# Patient Record
Sex: Male | Born: 1935 | Race: White | Hispanic: No | Marital: Married | Smoking: Former smoker
Health system: Southern US, Community
[De-identification: ages and names within clinical notes are randomized; demographics above are authoritative.]

## PROBLEM LIST (undated history)

## (undated) DIAGNOSIS — H6123 Impacted cerumen, bilateral: Secondary | ICD-10-CM

## (undated) DIAGNOSIS — H9191 Unspecified hearing loss, right ear: Secondary | ICD-10-CM

## (undated) DIAGNOSIS — R0602 Shortness of breath: Secondary | ICD-10-CM

## (undated) DIAGNOSIS — M8580 Other specified disorders of bone density and structure, unspecified site: Secondary | ICD-10-CM

## (undated) DIAGNOSIS — I2510 Atherosclerotic heart disease of native coronary artery without angina pectoris: Secondary | ICD-10-CM

## (undated) DIAGNOSIS — F411 Generalized anxiety disorder: Secondary | ICD-10-CM

## (undated) DIAGNOSIS — R2689 Other abnormalities of gait and mobility: Secondary | ICD-10-CM

## (undated) DIAGNOSIS — Z860100 Personal history of colon polyps, unspecified: Secondary | ICD-10-CM

## (undated) DIAGNOSIS — D696 Thrombocytopenia, unspecified: Secondary | ICD-10-CM

## (undated) DIAGNOSIS — R634 Abnormal weight loss: Secondary | ICD-10-CM

## (undated) DIAGNOSIS — D649 Anemia, unspecified: Secondary | ICD-10-CM

## (undated) DIAGNOSIS — M545 Low back pain: Principal | ICD-10-CM

## (undated) DIAGNOSIS — H9202 Otalgia, left ear: Secondary | ICD-10-CM

## (undated) DIAGNOSIS — M2669 Other specified disorders of temporomandibular joint: Secondary | ICD-10-CM

## (undated) DIAGNOSIS — IMO0001 Reserved for inherently not codable concepts without codable children: Secondary | ICD-10-CM

## (undated) DIAGNOSIS — Z87442 Personal history of urinary calculi: Secondary | ICD-10-CM

## (undated) DIAGNOSIS — Z0000 Encounter for general adult medical examination without abnormal findings: Secondary | ICD-10-CM

## (undated) DIAGNOSIS — M1611 Unilateral primary osteoarthritis, right hip: Secondary | ICD-10-CM

## (undated) DIAGNOSIS — G8929 Other chronic pain: Principal | ICD-10-CM

## (undated) DIAGNOSIS — K219 Gastro-esophageal reflux disease without esophagitis: Secondary | ICD-10-CM

## (undated) DIAGNOSIS — Z8719 Personal history of other diseases of the digestive system: Secondary | ICD-10-CM

## (undated) DIAGNOSIS — M7711 Lateral epicondylitis, right elbow: Secondary | ICD-10-CM

## (undated) DIAGNOSIS — R32 Unspecified urinary incontinence: Secondary | ICD-10-CM

## (undated) DIAGNOSIS — M79601 Pain in right arm: Secondary | ICD-10-CM

## (undated) DIAGNOSIS — R269 Unspecified abnormalities of gait and mobility: Secondary | ICD-10-CM

## (undated) DIAGNOSIS — G5602 Carpal tunnel syndrome, left upper limb: Secondary | ICD-10-CM

## (undated) DIAGNOSIS — G709 Myoneural disorder, unspecified: Secondary | ICD-10-CM

## (undated) DIAGNOSIS — M25551 Pain in right hip: Secondary | ICD-10-CM

## (undated) DIAGNOSIS — M12819 Other specific arthropathies, not elsewhere classified, unspecified shoulder: Secondary | ICD-10-CM

## (undated) DIAGNOSIS — H52203 Unspecified astigmatism, bilateral: Secondary | ICD-10-CM

## (undated) DIAGNOSIS — J8410 Pulmonary fibrosis, unspecified: Secondary | ICD-10-CM

## (undated) DIAGNOSIS — G4700 Insomnia, unspecified: Secondary | ICD-10-CM

## (undated) DIAGNOSIS — Z951 Presence of aortocoronary bypass graft: Secondary | ICD-10-CM

## (undated) DIAGNOSIS — J449 Chronic obstructive pulmonary disease, unspecified: Secondary | ICD-10-CM

## (undated) DIAGNOSIS — I480 Paroxysmal atrial fibrillation: Secondary | ICD-10-CM

## (undated) DIAGNOSIS — E785 Hyperlipidemia, unspecified: Secondary | ICD-10-CM

## (undated) DIAGNOSIS — R7302 Impaired glucose tolerance (oral): Secondary | ICD-10-CM

## (undated) DIAGNOSIS — J189 Pneumonia, unspecified organism: Secondary | ICD-10-CM

## (undated) DIAGNOSIS — I739 Peripheral vascular disease, unspecified: Secondary | ICD-10-CM

## (undated) DIAGNOSIS — M5441 Lumbago with sciatica, right side: Secondary | ICD-10-CM

## (undated) DIAGNOSIS — H9193 Unspecified hearing loss, bilateral: Secondary | ICD-10-CM

## (undated) DIAGNOSIS — K5909 Other constipation: Secondary | ICD-10-CM

## (undated) DIAGNOSIS — N200 Calculus of kidney: Secondary | ICD-10-CM

## (undated) DIAGNOSIS — R531 Weakness: Secondary | ICD-10-CM

## (undated) DIAGNOSIS — I6529 Occlusion and stenosis of unspecified carotid artery: Secondary | ICD-10-CM

## (undated) DIAGNOSIS — N400 Enlarged prostate without lower urinary tract symptoms: Secondary | ICD-10-CM

## (undated) DIAGNOSIS — R42 Dizziness and giddiness: Secondary | ICD-10-CM

## (undated) DIAGNOSIS — F419 Anxiety disorder, unspecified: Secondary | ICD-10-CM

## (undated) DIAGNOSIS — M1990 Unspecified osteoarthritis, unspecified site: Secondary | ICD-10-CM

## (undated) DIAGNOSIS — I951 Orthostatic hypotension: Secondary | ICD-10-CM

## (undated) DIAGNOSIS — E538 Deficiency of other specified B group vitamins: Secondary | ICD-10-CM

## (undated) DIAGNOSIS — R972 Elevated prostate specific antigen [PSA]: Secondary | ICD-10-CM

## (undated) DIAGNOSIS — F1010 Alcohol abuse, uncomplicated: Secondary | ICD-10-CM

## (undated) DIAGNOSIS — H5203 Hypermetropia, bilateral: Secondary | ICD-10-CM

## (undated) DIAGNOSIS — K5790 Diverticulosis of intestine, part unspecified, without perforation or abscess without bleeding: Secondary | ICD-10-CM

## (undated) DIAGNOSIS — H524 Presbyopia: Secondary | ICD-10-CM

## (undated) DIAGNOSIS — R079 Chest pain, unspecified: Secondary | ICD-10-CM

## (undated) DIAGNOSIS — R2681 Unsteadiness on feet: Secondary | ICD-10-CM

## (undated) DIAGNOSIS — K5900 Constipation, unspecified: Secondary | ICD-10-CM

## (undated) DIAGNOSIS — J45909 Unspecified asthma, uncomplicated: Secondary | ICD-10-CM

## (undated) DIAGNOSIS — M25511 Pain in right shoulder: Secondary | ICD-10-CM

## (undated) DIAGNOSIS — R251 Tremor, unspecified: Secondary | ICD-10-CM

## (undated) DIAGNOSIS — F1011 Alcohol abuse, in remission: Secondary | ICD-10-CM

## (undated) DIAGNOSIS — R21 Rash and other nonspecific skin eruption: Secondary | ICD-10-CM

## (undated) DIAGNOSIS — K5730 Diverticulosis of large intestine without perforation or abscess without bleeding: Secondary | ICD-10-CM

## (undated) DIAGNOSIS — Z5189 Encounter for other specified aftercare: Secondary | ICD-10-CM

## (undated) DIAGNOSIS — M519 Unspecified thoracic, thoracolumbar and lumbosacral intervertebral disc disorder: Secondary | ICD-10-CM

## (undated) DIAGNOSIS — R609 Edema, unspecified: Secondary | ICD-10-CM

## (undated) DIAGNOSIS — R4586 Emotional lability: Secondary | ICD-10-CM

## (undated) DIAGNOSIS — M7021 Olecranon bursitis, right elbow: Secondary | ICD-10-CM

## (undated) DIAGNOSIS — I779 Disorder of arteries and arterioles, unspecified: Secondary | ICD-10-CM

## (undated) HISTORY — DX: Constipation, unspecified: K59.00

## (undated) HISTORY — DX: Pulmonary fibrosis, unspecified: J84.10

## (undated) HISTORY — DX: Diverticulosis of intestine, part unspecified, without perforation or abscess without bleeding: K57.90

## (undated) HISTORY — DX: Rash and other nonspecific skin eruption: R21

## (undated) HISTORY — DX: Unspecified thoracic, thoracolumbar and lumbosacral intervertebral disc disorder: M51.9

## (undated) HISTORY — DX: Orthostatic hypotension: I95.1

## (undated) HISTORY — DX: Other specific arthropathies, not elsewhere classified, unspecified shoulder: M12.819

## (undated) HISTORY — DX: Gastro-esophageal reflux disease without esophagitis: K21.9

## (undated) HISTORY — DX: Occlusion and stenosis of unspecified carotid artery: I65.29

## (undated) HISTORY — DX: Otalgia, left ear: H92.02

## (undated) HISTORY — DX: Tremor, unspecified: R25.1

## (undated) HISTORY — DX: Edema, unspecified: R60.9

## (undated) HISTORY — DX: Encounter for general adult medical examination without abnormal findings: Z00.00

## (undated) HISTORY — DX: Personal history of colonic polyps: Z86.010

## (undated) HISTORY — DX: Pain in right hip: M25.551

## (undated) HISTORY — DX: Alcohol abuse, in remission: F10.11

## (undated) HISTORY — DX: Unilateral primary osteoarthritis, right hip: M16.11

## (undated) HISTORY — DX: Diverticulosis of large intestine without perforation or abscess without bleeding: K57.30

## (undated) HISTORY — DX: Personal history of other diseases of the digestive system: Z87.19

## (undated) HISTORY — DX: Emotional lability: R45.86

## (undated) HISTORY — DX: Alcohol abuse, uncomplicated: F10.10

## (undated) HISTORY — DX: Calculus of kidney: N20.0

## (undated) HISTORY — DX: Other chronic pain: G89.29

## (undated) HISTORY — DX: Impacted cerumen, bilateral: H61.23

## (undated) HISTORY — DX: Unspecified urinary incontinence: R32

## (undated) HISTORY — DX: Personal history of urinary calculi: Z87.442

## (undated) HISTORY — PX: KIDNEY STONE SURGERY: SHX686

## (undated) HISTORY — DX: Olecranon bursitis, right elbow: M70.21

## (undated) HISTORY — DX: Insomnia, unspecified: G47.00

## (undated) HISTORY — PX: TONSILLECTOMY: SUR1361

## (undated) HISTORY — DX: Presbyopia: H52.4

## (undated) HISTORY — DX: Other abnormalities of gait and mobility: R26.89

## (undated) HISTORY — DX: Unspecified abnormalities of gait and mobility: R26.9

## (undated) HISTORY — DX: Dizziness and giddiness: R42

## (undated) HISTORY — DX: Generalized anxiety disorder: F41.1

## (undated) HISTORY — PX: COLONOSCOPY: SHX174

## (undated) HISTORY — DX: Benign prostatic hyperplasia without lower urinary tract symptoms: N40.0

## (undated) HISTORY — DX: Presence of aortocoronary bypass graft: Z95.1

## (undated) HISTORY — DX: Pneumonia, unspecified organism: J18.9

## (undated) HISTORY — DX: Hyperlipidemia, unspecified: E78.5

## (undated) HISTORY — DX: Weakness: R53.1

## (undated) HISTORY — DX: Unspecified hearing loss, bilateral: H91.93

## (undated) HISTORY — DX: Hypermetropia, bilateral: H52.03

## (undated) HISTORY — PX: CARDIAC CATHETERIZATION: SHX172

## (undated) HISTORY — DX: Low back pain: M54.5

## (undated) HISTORY — DX: Peripheral vascular disease, unspecified: I73.9

## (undated) HISTORY — DX: Other constipation: K59.09

## (undated) HISTORY — DX: Atherosclerotic heart disease of native coronary artery without angina pectoris: I25.10

## (undated) HISTORY — DX: Unspecified astigmatism, bilateral: H52.203

## (undated) HISTORY — DX: Unspecified asthma, uncomplicated: J45.909

## (undated) HISTORY — DX: Deficiency of other specified B group vitamins: E53.8

## (undated) HISTORY — DX: Lateral epicondylitis, right elbow: M77.11

## (undated) HISTORY — DX: Lumbago with sciatica, right side: M54.41

## (undated) HISTORY — DX: Pain in right shoulder: M25.511

## (undated) HISTORY — PX: CATARACT EXTRACTION: SUR2

## (undated) HISTORY — DX: Carpal tunnel syndrome, left upper limb: G56.02

## (undated) HISTORY — DX: Elevated prostate specific antigen (PSA): R97.20

## (undated) HISTORY — DX: Abnormal weight loss: R63.4

## (undated) HISTORY — DX: Other specified disorders of bone density and structure, unspecified site: M85.80

## (undated) HISTORY — DX: Impaired glucose tolerance (oral): R73.02

## (undated) HISTORY — DX: Pain in right arm: M79.601

## (undated) HISTORY — DX: Paroxysmal atrial fibrillation: I48.0

## (undated) HISTORY — DX: Thrombocytopenia, unspecified: D69.6

## (undated) HISTORY — DX: Chest pain, unspecified: R07.9

## (undated) HISTORY — DX: Unsteadiness on feet: R26.81

## (undated) HISTORY — DX: Disorder of arteries and arterioles, unspecified: I77.9

## (undated) HISTORY — DX: Personal history of colon polyps, unspecified: Z86.0100

## (undated) HISTORY — DX: Unspecified hearing loss, right ear: H91.91

## (undated) HISTORY — DX: Other specified disorders of temporomandibular joint: M26.69

---

## 2004-10-25 ENCOUNTER — Ambulatory Visit: Payer: Self-pay | Admitting: Critical Care Medicine

## 2004-11-25 ENCOUNTER — Ambulatory Visit: Payer: Self-pay | Admitting: Critical Care Medicine

## 2004-12-29 ENCOUNTER — Ambulatory Visit: Payer: Self-pay | Admitting: Critical Care Medicine

## 2005-04-04 ENCOUNTER — Ambulatory Visit: Payer: Self-pay | Admitting: Critical Care Medicine

## 2005-04-19 ENCOUNTER — Ambulatory Visit: Payer: Self-pay | Admitting: Internal Medicine

## 2005-04-26 ENCOUNTER — Ambulatory Visit: Payer: Self-pay | Admitting: Cardiology

## 2005-05-02 ENCOUNTER — Ambulatory Visit: Payer: Self-pay | Admitting: Internal Medicine

## 2005-05-29 ENCOUNTER — Ambulatory Visit: Payer: Self-pay | Admitting: Internal Medicine

## 2005-09-08 ENCOUNTER — Ambulatory Visit: Payer: Self-pay

## 2005-09-08 ENCOUNTER — Ambulatory Visit: Payer: Self-pay | Admitting: Cardiology

## 2005-09-12 ENCOUNTER — Ambulatory Visit (HOSPITAL_COMMUNITY): Admission: RE | Admit: 2005-09-12 | Discharge: 2005-09-13 | Payer: Self-pay | Admitting: Ophthalmology

## 2005-09-13 ENCOUNTER — Ambulatory Visit: Payer: Self-pay

## 2006-02-06 ENCOUNTER — Ambulatory Visit: Payer: Self-pay

## 2006-07-13 ENCOUNTER — Ambulatory Visit: Payer: Self-pay | Admitting: Critical Care Medicine

## 2006-08-10 ENCOUNTER — Ambulatory Visit: Payer: Self-pay | Admitting: Critical Care Medicine

## 2006-11-26 ENCOUNTER — Ambulatory Visit: Payer: Self-pay | Admitting: Critical Care Medicine

## 2006-12-04 DIAGNOSIS — R091 Pleurisy: Secondary | ICD-10-CM | POA: Insufficient documentation

## 2006-12-04 DIAGNOSIS — K219 Gastro-esophageal reflux disease without esophagitis: Secondary | ICD-10-CM

## 2006-12-04 HISTORY — DX: Gastro-esophageal reflux disease without esophagitis: K21.9

## 2007-01-01 ENCOUNTER — Ambulatory Visit: Payer: Self-pay | Admitting: Internal Medicine

## 2007-01-01 LAB — CONVERTED CEMR LAB
ALT: 32 units/L (ref 0–53)
AST: 37 units/L (ref 0–37)
Albumin: 3.5 g/dL (ref 3.5–5.2)
BUN: 14 mg/dL (ref 6–23)
Bilirubin, Direct: 0.1 mg/dL (ref 0.0–0.3)
CO2: 27 meq/L (ref 19–32)
Calcium: 9.3 mg/dL (ref 8.4–10.5)
Chloride: 105 meq/L (ref 96–112)
Eosinophils Absolute: 0.4 10*3/uL (ref 0.0–0.6)
GFR calc non Af Amer: 117 mL/min
HCT: 41.2 % (ref 39.0–52.0)
Hemoglobin: 14.1 g/dL (ref 13.0–17.0)
Leukocytes, UA: NEGATIVE
MCHC: 34.3 g/dL (ref 30.0–36.0)
MCV: 94.4 fL (ref 78.0–100.0)
Monocytes Absolute: 0.7 10*3/uL (ref 0.2–0.7)
Neutro Abs: 5.7 10*3/uL (ref 1.4–7.7)
Nitrite: NEGATIVE
RDW: 12.9 % (ref 11.5–14.6)
Sodium: 139 meq/L (ref 135–145)
Specific Gravity, Urine: 1.025 (ref 1.000–1.03)
Total Bilirubin: 0.6 mg/dL (ref 0.3–1.2)
Urine Glucose: NEGATIVE mg/dL
Urobilinogen, UA: 0.2 (ref 0.0–1.0)
WBC: 9.8 10*3/uL (ref 4.5–10.5)

## 2007-03-05 ENCOUNTER — Ambulatory Visit: Payer: Self-pay | Admitting: Internal Medicine

## 2007-05-21 DIAGNOSIS — G8929 Other chronic pain: Secondary | ICD-10-CM

## 2007-05-21 DIAGNOSIS — E785 Hyperlipidemia, unspecified: Secondary | ICD-10-CM

## 2007-05-21 DIAGNOSIS — Z87442 Personal history of urinary calculi: Secondary | ICD-10-CM

## 2007-05-21 DIAGNOSIS — Z8719 Personal history of other diseases of the digestive system: Secondary | ICD-10-CM

## 2007-05-21 DIAGNOSIS — G4700 Insomnia, unspecified: Secondary | ICD-10-CM

## 2007-05-21 DIAGNOSIS — F1011 Alcohol abuse, in remission: Secondary | ICD-10-CM

## 2007-05-21 HISTORY — DX: Personal history of other diseases of the digestive system: Z87.19

## 2007-05-21 HISTORY — DX: Alcohol abuse, in remission: F10.11

## 2007-05-21 HISTORY — DX: Other chronic pain: G89.29

## 2007-05-21 HISTORY — DX: Personal history of urinary calculi: Z87.442

## 2007-06-27 ENCOUNTER — Ambulatory Visit: Payer: Self-pay | Admitting: Critical Care Medicine

## 2007-06-27 DIAGNOSIS — J479 Bronchiectasis, uncomplicated: Secondary | ICD-10-CM

## 2008-07-17 ENCOUNTER — Telehealth: Payer: Self-pay | Admitting: Internal Medicine

## 2008-07-21 ENCOUNTER — Ambulatory Visit: Payer: Self-pay | Admitting: Internal Medicine

## 2008-08-05 ENCOUNTER — Telehealth: Payer: Self-pay | Admitting: Internal Medicine

## 2009-05-03 ENCOUNTER — Ambulatory Visit: Payer: Self-pay | Admitting: Critical Care Medicine

## 2009-05-04 ENCOUNTER — Telehealth: Payer: Self-pay | Admitting: Critical Care Medicine

## 2009-05-21 ENCOUNTER — Ambulatory Visit: Payer: Self-pay | Admitting: Critical Care Medicine

## 2010-02-18 ENCOUNTER — Telehealth: Payer: Self-pay | Admitting: Internal Medicine

## 2010-02-21 ENCOUNTER — Ambulatory Visit: Payer: Self-pay | Admitting: Gastroenterology

## 2010-02-21 ENCOUNTER — Encounter: Payer: Self-pay | Admitting: Internal Medicine

## 2010-02-21 DIAGNOSIS — Z860100 Personal history of colon polyps, unspecified: Secondary | ICD-10-CM

## 2010-02-21 DIAGNOSIS — K5900 Constipation, unspecified: Secondary | ICD-10-CM

## 2010-02-21 DIAGNOSIS — K5730 Diverticulosis of large intestine without perforation or abscess without bleeding: Secondary | ICD-10-CM

## 2010-02-21 HISTORY — DX: Diverticulosis of large intestine without perforation or abscess without bleeding: K57.30

## 2010-02-21 HISTORY — DX: Personal history of colonic polyps: Z86.010

## 2010-02-21 HISTORY — DX: Personal history of colon polyps, unspecified: Z86.0100

## 2010-02-21 HISTORY — DX: Constipation, unspecified: K59.00

## 2010-02-23 LAB — CONVERTED CEMR LAB
Basophils Absolute: 0.2 10*3/uL — ABNORMAL HIGH (ref 0.0–0.1)
Basophils Relative: 1.5 % (ref 0.0–3.0)
Eosinophils Absolute: 0.3 10*3/uL (ref 0.0–0.7)
Lymphocytes Relative: 18.8 % (ref 12.0–46.0)
Lymphs Abs: 2 10*3/uL (ref 0.7–4.0)
Monocytes Absolute: 0.7 10*3/uL (ref 0.1–1.0)
Monocytes Relative: 6.5 % (ref 3.0–12.0)
Neutro Abs: 7.6 10*3/uL (ref 1.4–7.7)
Platelets: 158 10*3/uL (ref 150.0–400.0)
RDW: 14.3 % (ref 11.5–14.6)
WBC: 10.8 10*3/uL — ABNORMAL HIGH (ref 4.5–10.5)

## 2010-03-15 ENCOUNTER — Ambulatory Visit: Payer: Self-pay | Admitting: Internal Medicine

## 2010-03-17 ENCOUNTER — Encounter: Payer: Self-pay | Admitting: Internal Medicine

## 2010-03-25 ENCOUNTER — Ambulatory Visit: Admit: 2010-03-25 | Payer: Self-pay | Admitting: Internal Medicine

## 2010-04-21 NOTE — Assessment & Plan Note (Signed)
Summary: Constipation/black stools/[facility]   History of Present Illness Visit Type: Follow-up Visit Primary GI MD: Stan Head MD [facility] Primary Provider: Barney Drain, MD Chief Complaint: constipation, black stools x 2 weeks, patient has not had a bowel movement since  02/15/10 History of Present Illness:   77 YO MALE KNOWN TO DR. Leone Payor WITH HX OF IBS, CHRONIC CONSTIPATION,DIVERTICULOSIS AND FAMILY HX OF COLON CANCER. LAST COLONOSCOPY WAS IN 2007-NO POLYPS. PT DOES HAVE PRIOR HX OF ADENOMATOUS POLYPS.  HE COMES IN TODAY WITH C/O INCREASED CONSTIPATION  OVER THE PAST COUPLE MONTHS. HE HAD BEEN ON MIRALAX DAILY WHICH WORKED WELL BUT EVENTUALLY SEEMED TO BECOME INEFFECTIVE  SO HE STOPPED IT. HE HAS BEEN USING A VARIETY OF LAXATIVES AND STOOL SOFTENERS BUT FINDS DULCOLAX TABLETS WORK THE BEST. HE USUALLY JUST TAKES IT ONCE A WEEK,AND MAY GO A WEEK BETWEEN BM'S. HE ALSO REPORTS DARK STOOLS BUT ONLY ON THE DAYS HE TAKES THE DULCOLAX. NO REAL ABDOMINAL PAIN, NO FEVERS, NO N/V. APPETITE FINE, WEIGHT STABLE.   GI Review of Systems    Reports abdominal pain.     Location of  Abdominal pain: lower abdomen.    Denies acid reflux, belching, bloating, chest pain, dysphagia with liquids, dysphagia with solids, heartburn, loss of appetite, nausea, vomiting, vomiting blood, weight loss, and  weight gain.      Reports black tarry stools, constipation, and  diverticulosis.     Denies anal fissure, change in bowel habit, diarrhea, fecal incontinence, heme positive stool, hemorrhoids, irritable bowel syndrome, jaundice, light color stool, liver problems, rectal bleeding, and  rectal pain.   Current Medications (verified): 1)  Lorazepam 2 Mg  Tabs (Lorazepam) .... One Half Daily 2)  Adult Aspirin Low Strength 81 Mg  Tbdp (Aspirin) .... One Daily 3)  Prilosec Otc 20 Mg Tbec (Omeprazole Magnesium) .... Once Daily  Allergies (verified): 1)  ! Codeine  Past History:  Past Medical History: BRONCHIECTASIS    NEPHROLITHIASIS PNEUMONIA  HYPERLIPIDEMIA INSOMNIA ALCOHOL ABUSE ASTHMATIC BRONCHITIS PLEURISY GERD  DIVERTICULOSIS HX OF COLON POLYPS  Past Surgical History: Tonsillectomy COLONOSCOPY 2007/DR. GESSNER-DIVERTICULOSIS  Family History: Reviewed history from 07/21/2008 and no changes required.  Family History of Heart Disease:Mother FATHER-COLON CANCER, ONE BROTHER -COLON CANCER  Social History: Reviewed history from 07/21/2008 and no changes required. Patient states former smoker.  Occupation:Retired  Alcohol Use - yes occ. Illicit Drug Use - no  Review of Systems       The patient complains of arthritis/joint pain, back pain, muscle pains/cramps, sleeping problems, swelling of feet/legs, and urination - excessive.  The patient denies allergy/sinus, anemia, anxiety-new, blood in urine, breast changes/lumps, change in vision, confusion, cough, coughing up blood, depression-new, fainting, fatigue, fever, headaches-new, hearing problems, heart murmur, heart rhythm changes, itching, menstrual pain, night sweats, nosebleeds, pregnancy symptoms, shortness of breath, skin rash, sore throat, swollen lymph glands, thirst - excessive , urination - excessive , urination changes/pain, urine leakage, vision changes, and voice change.         SEE HPI  Vital Signs:  Patient profile:   77 year old male Height:      71 inches Weight:      212 pounds BMI:     29.67 Pulse rate:   76 / minute Pulse rhythm:   regular BP sitting:   122 / 76  (left arm) Cuff size:   regular  Vitals Entered By: June McMurray CMA Duncan Dull) (February 21, 2010 10:05 AM)  Physical Exam  General:  Well developed,  well nourished, no acute distress, ELDERLY, AMBULATES SLOWLY Eyes:  PERRLA, no icterus. Neck:  Supple; no masses or thyromegaly. Lungs:  Clear throughout to auscultation. Heart:  Regular rate and rhythm; no murmurs, rubs,  or bruits. Abdomen:  SOFT, NONTENDER, NO MASS OR HSM,BS+ Rectal:  BROWN STOOL  ,HEME NEGATIVE Extremities:  No clubbing, cyanosis, edema or deformities noted. Neurologic:  Alert and  oriented x4;  grossly normal neurologically.weakness noted.   Psych:  Alert and cooperative. Normal mood and affect.poor memory.    Impression & Recommendations:  Problem # 1:  CONSTIPATION (ICD-564.00) Assessment Deteriorated 77 Y.O MALE WITH EXACERBATION OF CHRONIC CONSTIPATION. CBC TODAY SCHEDULE FOR FOLLOW UP COLONOSCOPY WITH DR. Leone Payor. PROCEDURE , RISKS. BENEFITS AND ALTERNATIVES DISCUSSED WITH PT AND WIFE AND THE PATIENT CONSENTS TO PROCEED. ONE BOTTLE OF MAG CITRATE  NEXT 24 HOURS TO PURGE BOWELS, THEN EITHER MIRALAX 34 GMS DAILY IN 16 OZ WATER OR CONTINUE DULCOLAX 2 TABS Q 3-4 DAYS. Orders: TLB-CBC Platelet - w/Differential ([id number]-[id number]) Colonoscopy (Colon)  Problem # 2:  COLORECTAL CANCER, FAMILY HX (ICD-V16.0) Assessment: Unchanged LAST COLON 05/2005- PT WITH CHANGE IN BOWEL HABITS-HAVE SCHEDULED FOR FOLLOW UP AS ABOVE Orders: Colonoscopy (Colon)  Problem # 3:  DIVERTICULOSIS-COLON (ICD-562.10) Assessment: Comment Only  Problem # 4:  Hx of BRONCHIECTASIS (ICD-494.0) Assessment: Comment Only  Problem # 5:  GERD (ICD-530.81) Assessment: Unchanged STABLE ON PRILOSEC 20 MG DAILY  Patient Instructions: 1)  We have scheduled the Colonoscopy with Dr. Leone Payor for 03-15-2010. 2)  Directions and brochure provided. 3)  [facility] Patient Information Guide given to patient. 4)  We sent the prescription for the prep you will be drinking for the colonoscopy to [facility]. 5)  For now, take 2 doses of Mirallax, 17 grams each (1 capful) with a glass of water. 6)  Get yourself  a bottle of Magesium Citrate today and drink that to get your bowels cleaned out.   7)  Copy sent to : Barney Drain 8)  The medication list was reviewed and reconciled.  All changed / newly prescribed medications were explained.  A complete medication list was provided to the patient /  caregiver.  Prescriptions: MOVIPREP 100 GM  SOLR (PEG-KCL-NACL-NASULF-NA ASC-C) As per prep instructions.  #1 x 0   Entered by:   Lowry Ram NCMA   Authorized by:   Sammuel Cooper PA-c   Signed by:   Lowry Ram NCMA on 02/21/2010   Method used:   Electronically to        [facility]* (retail)       [address]       [place], [place]  [address]       Ph: [phone]       Fax: [phone]   RxID:   [id number]

## 2010-04-21 NOTE — Assessment & Plan Note (Signed)
Summary: Pulmonary OV   Primary Provider/Referring Provider:  Barney Drain, MD  CC:  Pt c/o productive cough with white to yellow mucus approx "1 quart" x 1 week, wheezing, tightness in chest, and S.O.B with activity.  History of Present Illness: 77 yo WM with Golds Stage II COPD   May 03, 2009 3:18 PM Pt is noting increased cough,  and has been ill for two weeks.   Pt thinks he caught it from the spouse.  PCP rx amoxcil 500 three times a day for 10days.   No real change with amoxil.  Has thick yellow mucous that is hard to raise.  No chest pain.  No real fever.   Pt denies any significant sore throat, nasal congestion or fever, chills, sweats, unintended weight loss, pleurtic or exertional chest pain, orthopnea PND, or leg swelling   Preventive Screening-Counseling & Management  Alcohol-Tobacco     Smoking Status: quit > 6 months  Current Medications (verified): 1)  Lorazepam 2 Mg  Tabs (Lorazepam) .... One Half Daily 2)  Adult Aspirin Low Strength 81 Mg  Tbdp (Aspirin) .... One Daily 3)  Omeprazole 20 Mg Cpdr (Omeprazole) .... Take 1 Tablet By Mouth Once A Day 4)  Miralax   Pack (Polyethylene Glycol 3350) .... Use As Directed. 5)  Miralax  Powd (Polyethylene Glycol 3350) .... 2 Doses A Day 6)  Amoxicillin 500 Mg Caps (Amoxicillin) .... Take 1 Tablet By Mouth Three Times A Day  Allergies (verified): 1)  ! Codeine  Past History:  Past medical, surgical, family and social histories (including risk factors) reviewed, and no changes noted (except as noted below).  Past Medical History: Reviewed history from 07/21/2008 and no changes required. BRONCHIECTASIS  NEPHROLITHIASIS PNEUMONIA  HYPERLIPIDEMIA INSOMNIA ALCOHOL ABUSE ASTHMATIC BRONCHITIS PLEURISY GERD   Past Surgical History: Reviewed history from 05/21/2007 and no changes required. Tonsillectomy  Family History: Reviewed history from 07/21/2008 and no changes required. non contrib Family History of Heart  Disease:Mother  Social History: Reviewed history from 07/21/2008 and no changes required. Patient states former smoker.  Occupation:Retired  Alcohol Use - yes occ. Illicit Drug Use - no Smoking Status:  quit > 6 months  Review of Systems       The patient complains of shortness of breath with activity, shortness of breath at rest, productive cough, non-productive cough, and change in color of mucus.  The patient denies coughing up blood, chest pain, irregular heartbeats, acid heartburn, indigestion, loss of appetite, weight change, abdominal pain, difficulty swallowing, sore throat, tooth/dental problems, headaches, nasal congestion/difficulty breathing through nose, sneezing, itching, ear ache, anxiety, depression, hand/feet swelling, joint stiffness or pain, rash, and fever.    Vital Signs:  Patient profile:   77 year old male Height:      71 inches Weight:      217.25 pounds BMI:     30.41 O2 Sat:      94 % on Room air Temp:     97.5 degrees F oral Pulse rate:   77 / minute BP sitting:   120 / 78  (left arm) Cuff size:   regular  Vitals Entered By: Zackery Barefoot CMA (May 03, 2009 2:48 PM)  O2 Flow:  Room air CC: Pt c/o productive cough with white to yellow mucus approx "1 quart" x 1 week, wheezing, tightness in chest, S.O.B with activity Comments Medications reviewed with patient Verified pt's contact number Zackery Barefoot CMA  May 03, 2009 2:53 PM    Physical Exam  Additional Exam:  Gen: WD WN      WM    in NAD    NCAT Heent:  no jvd, no TMG, no cervical LNademopathy, orophyx clear,  nares with clear watery drainage. Cor: RRR nl s1/s2  no s3/s4  no m r h g Abd: soft NT BSA   no masses  No HSM  no rebound or guarding Ext perfused with no c v e v.d Neuro: intact, moves all 4s, CN II-XII intact, DTRs intact Chest: distant BS scattered rhonchi, i   no egophony  no consolidative breath sounds, mild hyperresonance to percussion Skin: clear  Genital/Rectal  :deferred    CXR  Procedure date:  05/03/2009  Findings:      IMPRESSION:   1.  Bronchitic changes. 2.  No pneumonia.  Impression & Recommendations:  Problem # 1:  ACUTE BRONCHITIS (ICD-466.0) Assessment Deteriorated Acute tracheobronchitis with copd flare, cxr today only shows chronic bronchits, no infiltrate plan trial dulera 200 two puff two times a day samples given depomedrol injection 120mg  5 d of avelox 400mg /d pulse prednisone The following medications were removed from the medication list:    Amoxicillin 500 Mg Caps (Amoxicillin) .Marland Kitchen... Take 1 tablet by mouth three times a day His updated medication list for this problem includes:    Dulera 200-5 Mcg/act Aero (Mometasone furo-formoterol fum) .Marland Kitchen... 2 puffs twice per day  Orders: Est. Patient Level V ([address]) Prescription Created Electronically [phone]) HFA Instruction [phone]) Admin of Therapeutic Inj  intramuscular or subcutaneous (91478) Depo- Medrol 80mg  (J1040) T-2 View CXR (71020TC)  Medications Added to Medication List This Visit: 1)  Omeprazole 20 Mg Cpdr (Omeprazole) .... Take 1 tablet by mouth once a day 2)  Amoxicillin 500 Mg Caps (Amoxicillin) .... Take 1 tablet by mouth three times a day 3)  Avapro 150 Mg Tabs (Irbesartan) .... One tablet by mouth daily 4)  Dulera 200-5 Mcg/act Aero (Mometasone furo-formoterol fum) .... 2 puffs twice per day 5)  Prednisone 10 Mg Tabs (Prednisone) .... Take as directed 4 each am x3days, 3 x 3days, 2 x 3days, 1 x 3days then stop  Complete Medication List: 1)  Lorazepam 2 Mg Tabs (Lorazepam) .... One half daily 2)  Adult Aspirin Low Strength 81 Mg Tbdp (Aspirin) .... One daily 3)  Omeprazole 20 Mg Cpdr (Omeprazole) .... Take 1 tablet by mouth once a day 4)  Miralax Pack (Polyethylene glycol 3350) .... Use as directed. 5)  Miralax Powd (Polyethylene glycol 3350) .... 2 doses a day 6)  Avapro 150 Mg Tabs (Irbesartan) .... One tablet by mouth daily 7)  Dulera 200-5 Mcg/act  Aero (Mometasone furo-formoterol fum) .... 2 puffs twice per day 8)  Prednisone 10 Mg Tabs (Prednisone) .... Take as directed 4 each am x3days, 3 x 3days, 2 x 3days, 1 x 3days then stop  Patient Instructions: 1)  A depomedrol 120mg  injection will be given 2)  Avelox one daily for 5 days 3)  Prednisone 10mg  4 each am x3days, 3 x 3days, 2 x 3days, 1 x 3days then stop 4)  Dulera two puffs twice a day until samples gone 5)  A chest xray will be obtained 6)  Return in 2-3 weeks for recheck Prescriptions: PREDNISONE 10 MG  TABS (PREDNISONE) Take as directed 4 each am x3days, 3 x 3days, 2 x 3days, 1 x 3days then stop  #20 x 0   Entered and Authorized by:   Storm Frisk MD   Signed by:   Charlcie Cradle  Delford Field MD on 05/03/2009   Method used:   Electronically to        [facility]* (retail)       [address]       [place], [place]  [address]       Ph: [phone]       Fax: [phone]   RxID:   [id number]    Immunization History:  Influenza Immunization History:    Influenza:  historical (12/21/2008)    Medication Administration  Injection # 1:    Medication: Depo- Medrol 80mg     Diagnosis: ACUTE BRONCHITIS (ICD-466.0)    Route: IM    Site: RUOQ gluteus    Exp Date: 12/2009    Lot #: [id number]    Mfr: TEVA    Comments: 120mg  given    Patient tolerated injection without complications    Given by: Michel Bickers CMA (May 03, 2009 3:41 PM)  Orders Added: 1)  Est. Patient Level V [95638] 2)  Prescription Created Electronically [G8553] 3)  HFA Instruction [phone] 4)  Admin of Therapeutic Inj  intramuscular or subcutaneous [96372] 5)  Depo- Medrol 80mg  [J1040] 6)  T-2 View CXR [71020TC]   Appended Document: Pulmonary OV doug schultz

## 2010-04-21 NOTE — Letter (Signed)
Summary: [facility] Instructions  [facility]  [address] [place], [place] [address]   Phone: [phone]  Fax: [phone]       Matthew Bridges    12-08-1930    MRN: [id number]        Procedure Day /Date: 03-15-2010     Arrival Time: 3:00 PM      Procedure Time: 4:00 PM     Location of Procedure:                    X     [facility] (4th Floor)  PREPARATION FOR COLONOSCOPY WITH MOVIPREP   Starting 5 days prior to your procedure 03-09-2010 do not eat nuts, seeds, popcorn, corn, beans, peas,  salads, or any raw vegetables.  Do not take any fiber supplements (e.g. Metamucil, Citrucel, and Benefiber).  THE DAY BEFORE YOUR PROCEDURE         DATE: 03-14-2010  DAY: Monday  1.  Drink clear liquids the entire day-NO SOLID FOOD  2.  Do not drink anything colored red or purple.  Avoid juices with pulp.  No orange juice.  3.  Drink at least 64 oz. (8 glasses) of fluid/clear liquids during the day to prevent dehydration and help the prep work efficiently.  CLEAR LIQUIDS INCLUDE: Water Jello Ice Popsicles Tea (sugar ok, no milk/cream) Powdered fruit flavored drinks Coffee (sugar ok, no milk/cream) Gatorade Juice: apple, white grape, white cranberry  Lemonade Clear bullion, consomm, broth Carbonated beverages (any kind) Strained chicken noodle soup Hard Candy                             4.  In the morning, mix first dose of MoviPrep solution:    Empty 1 Pouch A and 1 Pouch B into the disposable container    Add lukewarm drinking water to the top line of the container. Mix to dissolve    Refrigerate (mixed solution should be used within 24 hrs)  5.  Begin drinking the prep at 5:00 p.m. The MoviPrep container is divided by 4 marks.   Every 15 minutes drink the solution down to the next mark (approximately 8 oz) until the full liter is complete.   6.  Follow completed prep with 16 oz of clear liquid of your choice (Nothing red or purple).   Continue to drink clear liquids until bedtime.  7.  Before going to bed, mix second dose of MoviPrep solution:    Empty 1 Pouch A and 1 Pouch B into the disposable container    Add lukewarm drinking water to the top line of the container. Mix to dissolve    Refrigerate  THE DAY OF YOUR PROCEDURE      DATE: 03-15-2010 DAY: Tuesday  Beginning at 11:00 A.M.  (5 hours before procedure):         1. Every 15 minutes, drink the solution down to the next mark (approx 8 oz) until the full liter is complete.  2. Follow completed prep with 16 oz. of clear liquid of your choice.    3. You may drink clear liquids until 2:00 PM  (2 HOURS BEFORE PROCEDURE).   MEDICATION INSTRUCTIONS  Unless otherwise instructed, you should take regular prescription medications with a small sip of water   as early as possible the morning of your procedure.      OTHER INSTRUCTIONS  You will need a responsible adult at least  75 years of age to accompany you and drive you home.   This person must remain in the waiting room during your procedure.  Wear loose fitting clothing that is easily removed.  Leave jewelry and other valuables at home.  However, you may wish to bring a book to read or  an iPod/MP3 player to listen to music as you wait for your procedure to start.  Remove all body piercing jewelry and leave at home.  Total time from sign-in until discharge is approximately 2-3 hours.  You should go home directly after your procedure and rest.  You can resume normal activities the  day after your procedure.  The day of your procedure you should not:   Drive   Make legal decisions   Operate machinery   Drink alcohol   Return to work  You will receive specific instructions about eating, activities and medications before you leave.    The above instructions have been reviewed and explained to me by   _______________________    I fully understand and can verbalize these instructions  _____________________________ Date _________

## 2010-04-21 NOTE — Procedures (Signed)
Summary: Colonoscopy  Patient: Matthew Bridges Note: All result statuses are Final unless otherwise noted.  Tests: (1) Colonoscopy (COL)   COL Colonoscopy           DONE     [facility]     [address]     [place], [place]  [address]           COLONOSCOPY PROCEDURE REPORT           PATIENT:  Matthew Bridges, Matthew Bridges  MR#:  [id number]     BIRTHDATE:  Dec 21, 1930, 79 yrs. old  GENDER:  male     ENDOSCOPIST:  Iva Boop, MD, [facility]           PROCEDURE DATE:  03/15/2010     PROCEDURE:  Colonoscopy with snare polypectomy     ASA CLASS:  Class III     INDICATIONS:  change in bowel habits worsening constipation     also has family history of colon cancer in brother and father     MEDICATIONS:   Fentanyl 75 mcg, Versed 9 mg           DESCRIPTION OF PROCEDURE:   After the risks benefits and     alternatives of the procedure were thoroughly explained, informed     consent was obtained.  Digital rectal exam was performed and     revealed no rectal masses and normal prostate.   The LB [id number]     [id number] endoscope was introduced through the anus and advanced to     the cecum, which was identified by both the appendix and ileocecal     valve, without limitations.  The quality of the prep was adequate,     using MoviPrep.  The instrument was then slowly withdrawn as the     colon was fully examined. Insertion: 6:20 minutes Withdrawal:     13:25 minutes     <<PROCEDUREIMAGES>>           FINDINGS:  Two polyps were found (ascending and transverse colon).     They were diminutive. Polyps were snared without cautery.     Retrieval was successful. Severe diverticulosis was found     throughout the colon.  This was otherwise a normal examination of     the colon. The colon was redundant.   Retroflexed views in the     rectum revealed no abnormalities.    The scope was then withdrawn     from the patient and the procedure completed.           COMPLICATIONS:  None     ENDOSCOPIC  IMPRESSION:     1) Two diminutive polyps removed     2) Severe diverticulosis throughout the colon     3) Otherwise normal examination with adequate prep     RECOMMENDATIONS:     1) restart MiraLax and take 2-3 times a day if needed. (One dose     = 1 cap or 1 tablespoon in 6-8 ounces of  water or other liquid).           2) If no bowel movement after 3 days then use Dulcolax tablets     to promote bowel movement.           REPEAT EXAM:  Likely no further routine colonoscopy  due to age.           Iva Boop, MD, [facility]  CC:  Foye Deer, MD     The Patient           n.     eSIGNED:   Iva Boop at 03/15/2010 04:36 PM           Faye Ramsay, [id number]  Note: An exclamation mark (!) indicates a result that was not dispersed into the flowsheet. Document Creation Date: 03/15/2010 4:36 PM _______________________________________________________________________  (1) Order result status: Final Collection or observation date-time: 03/15/2010 16:25 Requested date-time:  Receipt date-time:  Reported date-time:  Referring Physician:   Ordering Physician: Stan Head [phone]) Specimen Source:  Source: Launa Grill Order Number: [id number] Lab site:   Appended Document: Colonoscopy   Colonoscopy  Procedure date:  03/15/2010  Findings:          1) Two diminutive polyps removed     2) Severe diverticulosis throughout the colon     3) Otherwise normal examination with adequate prep  -  TUBULAR ADENOMA (X1); NEGATIVE FOR HIGH GRADE DYSPLASIA OR MALIGNANCY. -  HYPERPLASTIC POLYP (X1).   Do not recommend further routine colonoscopy

## 2010-04-21 NOTE — Progress Notes (Signed)
Summary: triage   Phone Note Call from Patient Call back at Home Phone [phone]   Caller: Patient Call For: Dr. Leone Payor Reason for Call: Talk to Nurse Details for Reason: triage Summary of Call: Pt. having trouble having bm's...even when he takes a laxative, they are black.  Has an appt. scheduled but feels he needs to be seen sooner rather than later.  Please call. Initial call taken by: Schuyler Amor,  February 18, 2010 10:17 AM  Follow-up for Phone Call        Spoke with patient, states that he is having problems with constipation and that his stools are black. Patient denies using Pepto/Kaopectate/iron supplements. States he is having to use a laxative about once a week to go to bathroom. Offered appointment with Willette Cluster, RNP today but paient request an appointment for Monday. Appointment made with Mike Gip, PA for 02/21/10 @ 11am. Follow-up by: Selinda Michaels RN,  February 18, 2010 10:55 AM

## 2010-04-21 NOTE — Progress Notes (Signed)
Summary: results  Phone Note Call from Patient   Caller: Patient Call For: wright Summary of Call: calling for xray results Initial call taken by: Rickard Patience,  May 04, 2009 9:17 AM  Follow-up for Phone Call        CXR results in EMR.  Please advise.  Pt aware Dr Delford Field is out of office until 05-05-09. Abigail Miyamoto RN  May 04, 2009 9:26 AM   Additional Follow-up for Phone Call Additional follow up Details #1::        done Additional Follow-up by: Storm Frisk MD,  May 04, 2009 1:33 PM

## 2010-04-21 NOTE — Assessment & Plan Note (Signed)
Summary: Pulmonary OV   Primary Provider/Referring Provider:  Barney Drain, MD  CC:  3 wk follow up.  states cough has improved but still coughing some-productive with white mucus and states breathing has improved also however still having SOB with activity and occ at rest.  c/o "a lot of mucus in throat."  denies wheezing and chest tightness.  Requesting rx for dulera-[facility]..  History of Present Illness: 77 yo WM with Golds Stage II COPD   May 03, 2009 3:18 PM Pt is noting increased cough,  and has been ill for two weeks.   Pt thinks he caught it from the spouse.  PCP rx amoxcil 500 three times a day for 10days.   No real change with amoxil.  Has thick yellow mucous that is hard to raise.  No chest pain.  No real fever.   Pt denies any significant sore throat, nasal congestion or fever, chills, sweats, unintended weight loss, pleurtic or exertional chest pain, orthopnea PND, or leg swelling  May 21, 2009 1:43 PM at last ov we rx: A depomedrol 120mg  injection will be given Avelox one daily for 5 days Prednisone 10mg  4 each am x3days, 3 x 3days, 2 x 3days, 1 x 3days then stop Dulera two puffs twice a day until samples gone  Now mucous is white.  no real wheeze,  the dulera is helping.  pred helped and then as tapered was worse.   Preventive Screening-Counseling & Management  Alcohol-Tobacco     Smoking Status: quit > 6 months  Current Medications (verified): 1)  Lorazepam 2 Mg  Tabs (Lorazepam) .... One Half Daily 2)  Adult Aspirin Low Strength 81 Mg  Tbdp (Aspirin) .... One Daily 3)  Prilosec Otc 20 Mg Tbec (Omeprazole Magnesium) .... Once Daily 4)  Dulera 200-5 Mcg/act Aero (Mometasone Furo-Formoterol Fum) .... 2 Puffs Twice Per Day  Allergies (verified): 1)  ! Codeine  Past History:  Past medical, surgical, family and social histories (including risk factors) reviewed, and no changes noted (except as noted below).  Past Medical History: Reviewed history  from 07/21/2008 and no changes required. BRONCHIECTASIS  NEPHROLITHIASIS PNEUMONIA  HYPERLIPIDEMIA INSOMNIA ALCOHOL ABUSE ASTHMATIC BRONCHITIS PLEURISY GERD   Past Surgical History: Reviewed history from 05/21/2007 and no changes required. Tonsillectomy  Family History: Reviewed history from 07/21/2008 and no changes required. non contrib Family History of Heart Disease:Mother  Social History: Reviewed history from 07/21/2008 and no changes required. Patient states former smoker.  Occupation:Retired  Alcohol Use - yes occ. Illicit Drug Use - no  Review of Systems       The patient complains of shortness of breath with activity, productive cough, and non-productive cough.  The patient denies shortness of breath at rest, coughing up blood, chest pain, irregular heartbeats, acid heartburn, indigestion, loss of appetite, weight change, abdominal pain, difficulty swallowing, sore throat, tooth/dental problems, headaches, nasal congestion/difficulty breathing through nose, sneezing, itching, ear ache, anxiety, depression, hand/feet swelling, joint stiffness or pain, rash, change in color of mucus, and fever.    Vital Signs:  Patient profile:   77 year old male Height:      71 inches Weight:      212 pounds BMI:     29.67 O2 Sat:      95 % on Room air Temp:     97.9 degrees F oral Pulse rate:   74 / minute BP sitting:   134 / 78  (left arm) Cuff size:   regular  Vitals Entered By: Gweneth Dimitri RN (May 21, 2009 1:38 PM)  O2 Flow:  Room air CC: 3 wk follow up.  states cough has improved but still coughing some-productive with white mucus, states breathing has improved also however still having SOB with activity and occ at rest.  c/o "a lot of mucus in throat."  denies wheezing and chest tightness.  Requesting rx for dulera-[facility]. Comments Medications reviewed with patient Daytime contact number verified with patient. Crystal Jones RN  May 21, 2009 1:37  PM    Physical Exam  Additional Exam:  Gen: WD WN      WM    in NAD    NCAT Heent:  no jvd, no TMG, no cervical LNademopathy, orophyx clear,  nares with clear watery drainage. Cor: RRR nl s1/s2  no s3/s4  no m r h g Abd: soft NT BSA   no masses  No HSM  no rebound or guarding Ext perfused with no c v e v.d Neuro: intact, moves all 4s, CN II-XII intact, DTRs intact Chest: distant BS scattered rhonchi, i   no egophony  no consolidative breath sounds, mild hyperresonance to percussion Skin: clear  Genital/Rectal :deferred    Impression & Recommendations:  Problem # 1:  BRONCHITIS, OBSTRUCTIVE CHRONIC, ACUTE EXACERBATION (ICD-491.21) Assessment Deteriorated  Orders: Est. Patient Level IV (91478) Acute bronchial airflow obstruction recurrent plan repulse prednisone cont dulera  Medications Added to Medication List This Visit: 1)  Prilosec Otc 20 Mg Tbec (Omeprazole magnesium) .... Once daily 2)  Dulera 200-5 Mcg/act Aero (Mometasone furo-formoterol fum) .... 2 puffs twice per day pt with asthmatic bronchitis 3)  Prednisone 10 Mg Tabs (Prednisone) .... Take as directed 4 each am x3days, 3 x 3days, 2 x 3days, 1 x 3days then stop  Complete Medication List: 1)  Lorazepam 2 Mg Tabs (Lorazepam) .... One half daily 2)  Adult Aspirin Low Strength 81 Mg Tbdp (Aspirin) .... One daily 3)  Prilosec Otc 20 Mg Tbec (Omeprazole magnesium) .... Once daily 4)  Dulera 200-5 Mcg/act Aero (Mometasone furo-formoterol fum) .... 2 puffs twice per day pt with asthmatic bronchitis 5)  Prednisone 10 Mg Tabs (Prednisone) .... Take as directed 4 each am x3days, 3 x 3days, 2 x 3days, 1 x 3days then stop  Patient Instructions: 1)  Prednisone 10mg  4 each am x3days, 3 x 3days, 2 x 3days, 1 x 3days then stop 2)  Continue Dulera two puff twice daily 3)  Return two months Prescriptions: PREDNISONE 10 MG  TABS (PREDNISONE) Take as directed 4 each am x3days, 3 x 3days, 2 x 3days, 1 x 3days then stop  #30 x 0    Entered and Authorized by:   Storm Frisk MD   Signed by:   Storm Frisk MD on 05/21/2009   Method used:   Electronically to        [facility]* (retail)       [address]       [place], [place]  [address]       Ph: [phone]       Fax: [phone]   RxID:   [id number] 200-5 MCG/ACT AERO (MOMETASONE FURO-FORMOTEROL FUM) 2 puffs twice per day Pt with asthmatic bronchitis  #1 x 6   Entered and Authorized by:   Storm Frisk MD   Signed by:   Storm Frisk MD on 05/21/2009   Method used:  Electronically to        [facility]* (retail)       [address]       [place], [place]  [address]       Ph: [phone]       Fax: [phone]   RxID:   [id number]

## 2010-04-21 NOTE — Letter (Signed)
Summary: Patient Notice- Polyp Results  [facility]  [address] [place], [place] [address]   Phone: [phone]  Fax: [phone]        March 17, 2010 MRN: [id number]    Matthew Bridges [address] [place], [place]  [address]    Dear Matthew Bridges,  I am pleased to inform you that the colon polyps removed during your recent colonoscopy were found to be benign (no cancer detected) upon pathologic examination.  At your age and with these findings I do not recommend a routine repeat colonoscopy.  Should you develop new or worsening symptoms of abdominal pain, bowel habit changes or bleeding from the rectum or bowels, please schedule an evaluation with either your primary care physician or with me.  Please call us if you are having persistent problems or have questions about your condition that have not been fully answered at this time.  Sincerely,  Iva Boop MD, [facility]  This letter has been electronically signed by your physician.  Appended Document: Patient Notice- Polyp Results Letter mailed

## 2010-05-25 ENCOUNTER — Encounter: Payer: Self-pay | Admitting: Internal Medicine

## 2010-06-07 NOTE — Procedures (Signed)
Summary: Recall Assessment  Recall Assessment   Imported By: Lester  06/03/2010 08:26:59  _____________________________________________________________________  External Attachment:    Type:   Image     Comment:   External Document

## 2010-08-02 NOTE — Assessment & Plan Note (Signed)
[facility]                         GASTROENTEROLOGY OFFICE NOTE   NAME:JEFFERSONYandel, Zeiner                     MRN:          [id number]  DATE:03/05/2007                            DOB:          1931/03/06    CHIEF COMPLAINT:  Some loose stool when he passes gas.   INTERVAL HISTORY:  He returns for followup.  I started him on Librax and  MiraLax last visit.  He is moving his bowels much better since visit of  January 01, 2007.  The Librax is helping.  He is on amitriptyline at  bedtime.  Everything seems significantly better as far as I can tell.  He does occasionally have a little bit of leakage of stool, but he has a  lot of gas.  His CBC and CMET were normal last visit in October.  His  urinalysis was unremarkable.  He is not really complaining of any  persistent pain at this time.  He did fall and had a CT and an MRI of  the head.   REVIEW OF SYSTEMS:  Otherwise, negative.   PHYSICAL EXAM:  Weight 212 pounds, pulse 76, blood pressure 120/60.  RECTAL:  Formed brown stool.  No mass.   MEDICATION ALLERGIES:  Listed and reviewed.   PAST MEDICAL HISTORY:  Reviewed and unchanged from previous note.   ASSESSMENT:  I think he has some irritable bowel problems.  He is better  on the Librax and amitriptyline.  He is a bit gassy and he has a little  bit of fecal incontinence when he passes some gas, but overall that is  not a big deal, he says.   PLAN:  1. Trial of Align probiotics.  2. Refill Librax and amitriptyline.  3. I think he can stop his lorazepam.   I will follow up as needed at this time.     Iva Boop, MD,FACG  Electronically Signed    CEG/MedQ  DD: 03/06/2007  DT: 03/07/2007  Job #: [id number]   cc:   Foye Deer, MD

## 2010-08-02 NOTE — Assessment & Plan Note (Signed)
[facility]                             PULMONARY OFFICE NOTE   NAME:JEFFERSONBoston, Cookson                     MRN:          [id number]  DATE:08/10/2006                            DOB:          [birth year]    Matthew Bridges returns today in followup, a 77 year old white male with  asthmatic bronchitis, lower airway inflammation.  Overall, he is doing  better on the Advair 250/50 one spray b.i.d.  He has had decreased  cough, decreased mucous production, and significantly so decreased  shortness of breath since his last visit in April.   Other maintenance medicines listed in the chart include lorazepam at 1  mg daily, aspirin 81 mg daily, Caltrate b.i.d., Prilosec 20 mg daily.   On exam, temp was 98, blood pressure 120/82, pulse 76, saturation 94%  room air.  Chest showed distant breath sounds with prolonged expiratory phase, no  wheeze or rhonchi noted.  CARDIAC EXAM:  Showed a regular rate and rhythm without S3, NORMAL S1,  S2.  ABDOMEN:  Soft, nontender.  EXTREMITIES:  Showed no edema or clubbing.  SKIN:  Clear.   IMPRESSION:  Chronic obstructive lung disease, asthmatic bronchitic  component, stable at this time.   PLAN:  Maintain Advair as currently dose and we will see this patient  back in followup in 6 weeks.     Charlcie Cradle Delford Field, MD, [facility]  Electronically Signed    PEW/MedQ  DD: 08/10/2006  DT: 08/10/2006  Job #: [phone] [id number]

## 2010-08-02 NOTE — Assessment & Plan Note (Signed)
[facility]                         GASTROENTEROLOGY OFFICE NOTE   NAME:Matthew Bridges, Matthew Bridges                     MRN:          [id number]  DATE:01/01/2007                            DOB:          [birth year]    CHIEF COMPLAINT:  Abdominal pain, constipation.   Mr. Krummel has not been seen since last year when he had abdominal  pain and constipation.  He is describing sort of a periumbilical and  groin pain, plus it radiates into the epigastric area.  Essentially, he  has had persistent pain since I performed colonoscopy in March of last  year.  He has had some constipation problems lately as well.  He did  quit drinking a pint of whiskey and a six pack of beer every day in  April.  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   As far as review of systems, there has been no fever or bleeding or  weight loss.   He asks if we could cut out his diverticulosis.  He is not really  sleeping well.  He has been somewhat weak, he tells me, because of the  laxatives he has been taking.  He has not really been on a regular  regimen of MiraLax or anything like that.  He said the Levsin he was  using that seemed to help before quit working.   MEDICATIONS:  Listed and reviewed in the chart.   PAST MEDICAL HISTORY:  1. Allergies to CODEINE which causes dizziness.  2. Asthmatic bronchitis.  3. Diverticulosis coli.  4. Internal hemorrhoids.  5. Chronic pain.  6. Alcoholism, currently in remission.  There has been no evidence of      alcoholic liver disease that I can see.  7. Insomnia (not really helped by his lorazepam).  8. Mild dyslipidemia.  9. Gastroesophageal reflux disease.  10.History of pneumonia.  11.History of pleural scarring.  12.History of bronchiectasis.  13.History of nephrolithiasis.  14.Prior tonsillectomy.  15.Prior gallstones.   FAMILY HISTORY:  His brother had a colon mass.  There may be some  vascular disease.  He has a carotid stenosis, 70%.  He has had  a  tonsillectomy and finger operation as well.  Apparently, his father also  died of colon cancer.   PHYSICAL EXAMINATION:  Reveals a well-developed elderly white man in no  acute distress at this time.  He appears mildly chronically ill.  Weight 203 pounds, the same as it was when I saw him last year.  Pulse  80, regular.  Blood pressure 128/62.  Height 5 feet 11 inches.  EYES:  Anicteric.  NECK:  Supple.  HEART:  S1, S2, no murmurs, rubs, or gallops.  No gynecomastia.  I see no stigmata of chronic liver disease on the  chest.  The abdomen is soft.  He is somewhat diffusely tender, more so over the  suprapubic area.  RECTAL EXAM:  Shows no impaction, no significant stool other than some  formed brown stool.  He is nontender.  The prostate is small.  EXTREMITIES:  Free of edema.   Note:  He had also seen his urologist sometime in the last  couple of  months and had several rounds of antibiotics and was told that a  urinary tract and infection was cleared.  He is having some difficulty  with urination and decreased stream at times, he was on Flomax with the  antibiotics, but now that has stopped.   ASSESSMENT:  Chronic recurrent abdominal pain and constipation.  It  sounds most like irritable bowel syndrome to me.  He just had a  colonoscopy a year and a half ago without any significant lesions,  though he did have a diverticulosis, I do not think that is part of his  symptom complex.  He does have some back pain.  He could have some  problems with neuropathic pain from that, or perhaps from alcoholism.   PLAN:  1. He is congratulated on stopping alcohol.  2. Check CBC and a CMET today as well as urinalysis.  3. Librax 1 before meals #90 with 1 refill.  4. Amitriptyline 10 mg at bedtime #30 with 1 refill.  May be able to      increase that flow.  I am hoping the amitriptyline will help him      sleep as well as help his irritable bowel.  Could consider      something like  Cymbalta as well depending upon what we see.  5. I will see him back in 6 weeks.     Iva Boop, MD,FACG  Electronically Signed    CEG/MedQ  DD: 01/01/2007  DT: 01/02/2007  Job #: [id number]   cc:   Foye Deer, MD

## 2010-08-02 NOTE — Assessment & Plan Note (Signed)
[facility]                             PULMONARY OFFICE NOTE   NAME:Matthew Bridges, Matthew Bridges                     MRN:          [id number]  DATE:11/26/2006                            DOB:          1930/11/24    Mr. Matthew Bridges is a 77 year old male who returns for followup for  asthmatic bronchitis, chronic obstructive lung disease.  He is on the  Advair but he is not taking it on a regular basis as we had instructed  him to do so in May.  He states he is having increased chest congestion  at night and does have some postnasal drainage which has improved on  Claritin over the counter.  He maintains Prilosec over the counter 20 mg  daily.   EXAMINATION:  Temperature 98, blood pressure 104/60, pulse 84,  saturation 96% room air.  CHEST:  Showed distant breath sounds without evidence of wheeze or  rhonchi.  CARDIAC:  Showed a regular rate and rhythm, without S3; normal S1, S2.  ABDOMEN:  Soft, nontender.  EXTREMITIES:  Showed no edema or clubbing.  SKIN:  Clear.   IMPRESSION:  Asthmatic bronchitis, stable at this time.   PLAN:  Patient to maintain Advair on a more regular basis, one spray  twice daily.  Note is made that the patient is complaining of left flank  pain, it does not appear to be pulmonary in origin.  We have obtained  him an appointment with his primary care Brittinie Wherley to evaluate this, it  may be urologic.     Charlcie Cradle Delford Field, MD, [facility]  Electronically Signed    PEW/MedQ  DD: 11/26/2006  DT: 11/26/2006  Job #: [id number]   cc:   Foye Deer, Dr.

## 2010-08-05 NOTE — Assessment & Plan Note (Signed)
[facility]                             PULMONARY OFFICE NOTE   NAME:Matthew Bridges, Matthew Bridges                     MRN:          [id number]  DATE:07/13/2006                            DOB:          17-Jan-1931    Mr. Matthew Bridges is a 77 year old male who I have not seen since January of  2007.  I had seen him previously following a viral pneumonitis with  associated Klebsiella superinfection and pneumonia.  He had associated  asthmatic/bronchitic type symptomatology.  We had weaned his Asmanex to  off in 2006, and he was doing well over the interval time until 2 months  ago he began having increased cough, increased nocturnal shortness of  breath with mucous production and plugging.  The mucous was thick and  ropy, difficult to raise.  He was having no chest pain, no acid  symptoms.  He was feeling somewhat off balance and had increased  dizziness.  However, he is not imbibing any further alcohol.   CURRENT MEDICATIONS:  1. Lorazepam 2 mg h.s.  2. Aspirin 81 mg daily.  3. Caltrate D b.i.d.  4. Prilosec over the counter 20 mg daily.   PAST MEDICAL HISTORY:  Essentially unchanged from when we saw him last  and per the cardiology office note of September 08, 2005.  He is a nonsmoker  currently.  He is not diabetic, does not have hypertension.  He does  have mild hyperlipidemia, but was not able to take cholesterol  medication because of the GI upset.   PHYSICAL EXAMINATION:  Temperature 98, blood pressure 122/64, pulse 84,  saturation was 94% on room air.  Chest showed distant breath sounds with prolonged expiratory phase, poor  airflow.  CARDIAC EXAM:  Showed a regular rate and rhythm without S3, normal S1,  S2.  ABDOMEN:  Soft, nontender.  EXTREMITIES:  Showed no edema or clubbing or venous disease.  SKIN:  Clear.  NEUROLOGIC EXAM:  Intact.  HEENT EXAM:  Showed no jugular venous distension or lymphadenopathy.  Oropharynx was clear.  NECK:  Supple.   Spirometry showed obstructive defect with a severe peripheral airflow  obstruction, FEF 25-75 at 45% predicted.  FEV1 to FVC ratio of 66%  predicted.   IMPRESSION:  Asthmatic bronchitis with increased lower airway  inflammation.   PLAN:  To begin Advair 2 sprays b.i.d. 250/50 strength.  The patient was  instructed as to its proper use and prescription was given, and we will  see this patient back in return followup in 1 month.     Charlcie Cradle Matthew Field, MD, [facility]  Electronically Signed    PEW/MedQ  DD: 07/13/2006  DT: 07/13/2006  Job #: [id number]   cc:   Foye Deer, MD

## 2010-08-05 NOTE — Op Note (Signed)
NAMEERCOLE, GEORG            ACCOUNT NO.:  [id number]   MEDICAL RECORD NO.:  [id number]          PATIENT TYPE:  AMB   LOCATION:  SDS                          FACILITY:  [facility]   PHYSICIAN:  John D. Ashley Royalty, M.D. DATE OF BIRTH:  [birth year]   DATE OF PROCEDURE:  09/12/2005  DATE OF DISCHARGE:                                 OPERATIVE REPORT   PREOPERATIVE DIAGNOSIS:  Capsular fibrosis and retained lens material, left  eye.   POSTOPERATIVE DIAGNOSIS:  Capsular fibrosis and retained lens material, left  eye.   PROCEDURE:  Pars plana vitrectomy with retinal photocoagulation and  capsulectomy, left eye.   SURGEON:  Beulah Gandy. Ashley Royalty, M.D.   ASSISTANT:  Rosalie Doctor, MA   ANESTHESIA:  General.   DETAILS:  The usual prep and drape, 25 gauge trocars at 10, 2, and 4 o'clock  with 5-mm infusion at 4 o'clock, the lighted pick and the cutter were placed  at 10 and 2 o'clock respectively.  Provisc was placed on the corneal surface  and the BIOM viewing system moved into place.  Pars plana vitrectomy was  begun just behind the pseudophakos, capsular remnants and material was in  the visual axis.  This was carefully removed under low suction and rapid  cutting.  The capsule was trimmed back to a small peripheral rim. The  vitrectomy was carried posteriorly where lens remnants were encountered.  These were carefully removed under low suction and rapid cutting, as well.  Large clumps of remnants were seen and sheets of white material was removed.  The vitrectomy is carried out to the periphery where all vitreous was  removed from the vitreous base for 360 degrees. Scleral depression was used  to gain access to the pars plana area where some lens remnants were seen and  removed.  The intraocular lens was tested and found to be in good position  and in place. The indirect ophthalmoscope laser was moved into place. 686  burns were placed around the retinal periphery with the power at 460  milliwatts, 1000 microns each, at 0.1 seconds each.  The instruments were  removed from the eye and the wounds were held until tight.  They were tested  and found to be tight.  Polymyxin and gentamicin were irrigated into tenon's  space. Decadron 10 mg was injected into the lower subconjunctival space.  Marcaine was injected around the globe for postop pain.  TobraDex ophthalmic  ointment, a patch and shield were placed.  Closing pressure was 15 with a  [place] tonometer.  The patient was awakened and taken to recovery in  satisfactory condition.      Beulah Gandy. Ashley Royalty, M.D.  Electronically Signed     JDM/MEDQ  D:  09/12/2005  T:  09/12/2005  Job:  [id number]

## 2010-09-26 ENCOUNTER — Telehealth: Payer: Self-pay | Admitting: Critical Care Medicine

## 2010-09-26 ENCOUNTER — Encounter: Payer: Self-pay | Admitting: *Deleted

## 2010-09-26 ENCOUNTER — Ambulatory Visit (INDEPENDENT_AMBULATORY_CARE_PROVIDER_SITE_OTHER): Payer: Medicare Other | Admitting: Critical Care Medicine

## 2010-09-26 ENCOUNTER — Inpatient Hospital Stay (HOSPITAL_COMMUNITY): Payer: Medicare Other

## 2010-09-26 ENCOUNTER — Inpatient Hospital Stay (HOSPITAL_COMMUNITY)
Admission: AD | Admit: 2010-09-26 | Discharge: 2010-10-05 | DRG: 196 | Disposition: A | Discharge status: Home Health | Payer: Medicare Other | Source: Ambulatory Visit | Attending: Critical Care Medicine | Admitting: Critical Care Medicine

## 2010-09-26 DIAGNOSIS — J9600 Acute respiratory failure, unspecified whether with hypoxia or hypercapnia: Secondary | ICD-10-CM | POA: Diagnosis present

## 2010-09-26 DIAGNOSIS — J441 Chronic obstructive pulmonary disease with (acute) exacerbation: Secondary | ICD-10-CM

## 2010-09-26 DIAGNOSIS — J13 Pneumonia due to Streptococcus pneumoniae: Secondary | ICD-10-CM

## 2010-09-26 DIAGNOSIS — J8409 Other alveolar and parieto-alveolar conditions: Principal | ICD-10-CM | POA: Diagnosis present

## 2010-09-26 DIAGNOSIS — J471 Bronchiectasis with (acute) exacerbation: Secondary | ICD-10-CM

## 2010-09-26 LAB — COMPREHENSIVE METABOLIC PANEL
ALT: 33 U/L (ref 0–53)
BUN: 20 mg/dL (ref 6–23)
CO2: 26 mEq/L (ref 19–32)
Calcium: 9.3 mg/dL (ref 8.4–10.5)
Creatinine, Ser: 0.72 mg/dL (ref 0.50–1.35)
GFR calc Af Amer: >60 mL/min (ref >60)
GFR calc non Af Amer: >60 mL/min (ref >60)
Glucose, Bld: 124 mg/dL — ABNORMAL HIGH (ref 70–99)
Sodium: 135 mEq/L (ref 135–145)
Total Protein: 7.2 g/dL (ref 6.0–8.3)

## 2010-09-26 LAB — URINALYSIS, ROUTINE W REFLEX MICROSCOPIC
Leukocytes, UA: NEGATIVE
Nitrite: NEGATIVE
Specific Gravity, Urine: 1.024 (ref 1.005–1.030)
Urobilinogen, UA: 1 mg/dL (ref 0.0–1.0)
pH: 5.5 (ref 5.0–8.0)

## 2010-09-26 LAB — CBC
Hemoglobin: 13.6 g/dL (ref 13.0–17.0)
MCH: 32.5 pg (ref 26.0–34.0)
MCHC: 33.5 g/dL (ref 30.0–36.0)
MCV: 97.1 fL (ref 78.0–100.0)

## 2010-09-26 LAB — EXPECTORATED SPUTUM ASSESSMENT W GRAM STAIN, RFLX TO RESP C

## 2010-09-26 LAB — DIFFERENTIAL
Basophils Relative: 0 % (ref 0–1)
Lymphs Abs: 2 10*3/uL (ref 0.7–4.0)
Monocytes Absolute: 1 10*3/uL (ref 0.1–1.0)
Monocytes Relative: 7 % (ref 3–12)
Neutro Abs: 10.5 10*3/uL — ABNORMAL HIGH (ref 1.7–7.7)

## 2010-09-26 NOTE — Progress Notes (Signed)
Subjective:    Patient ID: Matthew Bridges, male    DOB: 12-Sep-1930, 77 y.o.   MRN: [id number]  HPI 77 yo WM with Golds Stage II COPD  May 03, 2009 3:18 PM  Pt is noting increased cough, and has been ill for two weeks. Pt thinks he caught it from the spouse. PCP rx amoxcil 500 three times a day for 10days. No real change with amoxil. Has thick yellow mucous that is hard to raise. No chest pain. No real fever.  Pt denies any significant sore throat, nasal congestion or fever, chills, sweats, unintended weight loss, pleurtic or exertional chest pain, orthopnea PND, or leg swelling  May 21, 2009 1:43 PM  at last ov we rx:  A depomedrol 120mg  injection will be given  Avelox one daily for 5 days  Prednisone 10mg  4 each am x3days, 3 x 3days, 2 x 3days, 1 x 3days then stop  Dulera two puffs twice a day until samples gone  Now mucous is white. no real wheeze, the dulera is helping. pred helped and then as tapered was worse.   09/26/2010 Since last ov 4/11:  Ill for two weeks dx bronchitis.  Then went to the beach and worse.  ABX Amoxicillin,  pcn shot,  Depo shot.  Pred x 8days 4/3/2/1.  Zpak. Despite this no better.  Mucinex also given .   Still coughing and hard to raise.  Hurts to cough.  Proair Rx  Pt has not responded to outpt rx Past Medical History  Diagnosis Date  . Bronchiectasis   . Nephrolithiasis   . Pneumonia   . Hyperlipidemia   . Insomnia   . Alcohol abuse   . Asthmatic bronchitis   . Pleurisy   . GERD (gastroesophageal reflux disease)   . Diverticulosis     Colonoscopy 2007/Dr. Leone Payor  . Hx of colonic polyp      Family History  Problem Relation Age of Onset  . Heart disease Mother   . Colon cancer Father   . Colon cancer Brother      History   Social History  . Marital Status: Married    Spouse Name: N/A    Number of Children: N/A  . Years of Education: N/A   Occupational History  . Retired    Social History Main Topics  . Smoking status: Former  Smoker -- 0.5 packs/day for 15 years    Types: Cigarettes    Quit date: 03/20/1956  . Smokeless tobacco: Former Neurosurgeon    Types: Snuff, Chew  . Alcohol Use: Yes  . Drug Use: No  . Sexually Active: Not on file   Other Topics Concern  . Not on file   Social History Narrative  . No narrative on file     Allergies  Allergen Reactions  . Codeine      No outpatient prescriptions prior to visit.      Review of Systems Constitutional:   No  weight loss, night sweats,  Fevers, chills, fatigue, lassitude. HEENT:   No headaches,  Difficulty swallowing,  Tooth/dental problems,  Sore throat,                No sneezing, itching, ear ache, nasal congestion, post nasal drip,   CV:  Notes  chest pain,  Orthopnea, PND, swelling in lower extremities, anasarca, dizziness, palpitations  GI  No heartburn, indigestion, abdominal pain, nausea, vomiting, diarrhea, change in bowel habits, loss of appetite  Resp: Notes  shortness of breath with  exertion or at rest.  No excess mucus, notes  productive cough,  No non-productive cough,  No coughing up of blood.  Notes  change in color of mucus.  Notes  wheezing.  No chest wall deformity  Skin: no rash or lesions.  GU: no dysuria, change in color of urine, no urgency or frequency.  No flank pain.  MS:  No joint pain or swelling.  No decreased range of motion.  No back pain.  Psych:  No change in mood or affect. No depression or anxiety.  No memory loss.     Objective:   Physical Exam Filed Vitals:   09/26/10 1147  BP: 128/66  Pulse: 91  Temp: 98.2 F (36.8 C)  TempSrc: Oral  Height: 5\' 11"  (1.803 m)  Weight: 199 lb 9.6 oz (90.538 kg)  SpO2: 93%    Gen: Pleasant, well-nourished, in no distress,  normal affect  ENT: No lesions,  mouth clear,  oropharynx clear, no postnasal drip  Neck: No JVD, no TMG, no carotid bruits  Lungs: No use of accessory muscles, no dullness to percussion,  Insp/exp wheeze, rhonchi scattered   Cardiovascular:  RRR, heart sounds normal, no murmur or gallops, no peripheral edema  Abdomen: soft and NT, no HSM,  BS normal  Musculoskeletal: No deformities, no cyanosis or clubbing  Neuro: alert, non focal  Skin: Warm, no lesions or rashes        Assessment & Plan:   BRONCHITIS, OBSTRUCTIVE CHRONIC, ACUTE EXACERBATION COPD with AB flare and fail to resolve with outpt RX Plan Admit for inpt RX IV rocephin IV medrol Mucinex BD neb meds See orders      Updated Medication List Outpatient Encounter Prescriptions as of 09/26/2010  Medication Sig Dispense Refill  . albuterol (PROAIR HFA) 108 (90 BASE) MCG/ACT inhaler Inhale 1 puff into the lungs every 6 (six) hours.        Marland Kitchen aspirin 81 MG tablet Take 81 mg by mouth daily.        Marland Kitchen guaifenesin (HUMIBID E) 400 MG TABS Take 800 mg by mouth 2 (two) times daily.        Marland Kitchen LORazepam (ATIVAN) 1 MG tablet Take 1 mg by mouth daily.        Marland Kitchen omeprazole (PRILOSEC) 20 MG capsule Take 20 mg by mouth daily.

## 2010-09-26 NOTE — Telephone Encounter (Signed)
PATIENT'S WIFE CALLED BACK.  HE HAS HAD BRONCHITIS FOR 2 WEEKS, HAS FEVER, AND CANNOT TAKE DEEP BREATH.  HE SAYS DR Delford Field TOLD HIM THAT HE WOULD SEE HIM ANY TIME, DAY OR NIGHT, WHENEVER HE NEEDED HIM.  PATIENT WANTS TO BE SEEN TODAY BY DR Delford Field SO MADE APPOINTMENT AT 11:45

## 2010-09-26 NOTE — Patient Instructions (Signed)
You will be admitted to [facility]  

## 2010-09-26 NOTE — Progress Notes (Signed)
  Subjective:    Patient ID: Matthew Bridges, male    DOB: 08-Nov-1930, 77 y.o.   MRN: [id number]  HPI    Review of Systems     Objective:   Physical Exam        Assessment & Plan:

## 2010-09-26 NOTE — Assessment & Plan Note (Signed)
COPD with AB flare and fail to resolve with outpt RX Plan Admit for inpt RX IV rocephin IV medrol Mucinex BD neb meds See orders

## 2010-09-27 LAB — LEGIONELLA ANTIGEN, URINE: Legionella Antigen, Urine: NEGATIVE

## 2010-09-28 ENCOUNTER — Inpatient Hospital Stay (HOSPITAL_COMMUNITY): Payer: Medicare Other

## 2010-09-28 LAB — CBC
HCT: 38.5 % — ABNORMAL LOW (ref 39.0–52.0)
Hemoglobin: 12.9 g/dL — ABNORMAL LOW (ref 13.0–17.0)
MCH: 32.4 pg (ref 26.0–34.0)
MCHC: 33.5 g/dL (ref 30.0–36.0)
RDW: 12.4 % (ref 11.5–15.5)

## 2010-09-28 LAB — BASIC METABOLIC PANEL
BUN: 22 mg/dL (ref 6–23)
Calcium: 9.4 mg/dL (ref 8.4–10.5)
GFR calc Af Amer: >60 mL/min (ref >60)
GFR calc non Af Amer: >60 mL/min (ref >60)
Glucose, Bld: 171 mg/dL — ABNORMAL HIGH (ref 70–99)
Potassium: 4 mEq/L (ref 3.5–5.1)
Sodium: 137 mEq/L (ref 135–145)

## 2010-09-28 LAB — CULTURE, RESPIRATORY W GRAM STAIN
Culture: NORMAL
Gram Stain: NONE SEEN

## 2010-09-28 MED ORDER — IOHEXOL 300 MG/ML  SOLN
80.0000 mL | Freq: Once | INTRAMUSCULAR | Status: AC | PRN
  Administered 2010-09-28: 80 mL via INTRAVENOUS

## 2010-09-29 DIAGNOSIS — I517 Cardiomegaly: Secondary | ICD-10-CM

## 2010-09-29 LAB — BASIC METABOLIC PANEL
Calcium: 8.7 mg/dL (ref 8.4–10.5)
Creatinine, Ser: 0.78 mg/dL (ref 0.50–1.35)
GFR calc Af Amer: >60 mL/min (ref >60)
GFR calc non Af Amer: >60 mL/min (ref >60)
Sodium: 140 mEq/L (ref 135–145)

## 2010-09-29 LAB — CBC
MCH: 32.1 pg (ref 26.0–34.0)
MCV: 97.9 fL (ref 78.0–100.0)
Platelets: 178 10*3/uL (ref 150–400)
RDW: 12.7 % (ref 11.5–15.5)

## 2010-09-30 ENCOUNTER — Other Ambulatory Visit: Payer: Self-pay | Admitting: Critical Care Medicine

## 2010-09-30 ENCOUNTER — Inpatient Hospital Stay (HOSPITAL_COMMUNITY): Payer: Medicare Other

## 2010-09-30 LAB — BASIC METABOLIC PANEL
Chloride: 101 mEq/L (ref 96–112)
Creatinine, Ser: 0.64 mg/dL (ref 0.50–1.35)
GFR calc Af Amer: >60 mL/min (ref >60)
Potassium: 3.9 mEq/L (ref 3.5–5.1)
Sodium: 134 mEq/L — ABNORMAL LOW (ref 135–145)

## 2010-09-30 LAB — CBC
Platelets: 184 10*3/uL (ref 150–400)
RBC: 3.81 MIL/uL — ABNORMAL LOW (ref 4.22–5.81)
RDW: 12.9 % (ref 11.5–15.5)
WBC: 12 10*3/uL — ABNORMAL HIGH (ref 4.0–10.5)

## 2010-10-01 ENCOUNTER — Inpatient Hospital Stay (HOSPITAL_COMMUNITY): Payer: Medicare Other

## 2010-10-01 LAB — CBC
Hemoglobin: 12.3 g/dL — ABNORMAL LOW (ref 13.0–17.0)
MCH: 32.3 pg (ref 26.0–34.0)
MCHC: 33.8 g/dL (ref 30.0–36.0)
RDW: 12.8 % (ref 11.5–15.5)

## 2010-10-01 LAB — BASIC METABOLIC PANEL
BUN: 17 mg/dL (ref 6–23)
Calcium: 8.8 mg/dL (ref 8.4–10.5)
GFR calc Af Amer: >60 mL/min (ref >60)
GFR calc non Af Amer: >60 mL/min (ref >60)
Glucose, Bld: 123 mg/dL — ABNORMAL HIGH (ref 70–99)
Sodium: 131 mEq/L — ABNORMAL LOW (ref 135–145)

## 2010-10-01 LAB — IGE: IgE (Immunoglobulin E), Serum: 406.6 IU/mL — ABNORMAL HIGH (ref 0.0–180.0)

## 2010-10-02 ENCOUNTER — Inpatient Hospital Stay (HOSPITAL_COMMUNITY): Payer: Medicare Other

## 2010-10-02 DIAGNOSIS — J471 Bronchiectasis with (acute) exacerbation: Secondary | ICD-10-CM

## 2010-10-02 DIAGNOSIS — J13 Pneumonia due to Streptococcus pneumoniae: Secondary | ICD-10-CM

## 2010-10-03 ENCOUNTER — Other Ambulatory Visit: Payer: Self-pay | Admitting: Critical Care Medicine

## 2010-10-03 LAB — CULTURE, RESPIRATORY W GRAM STAIN: Gram Stain: NONE SEEN

## 2010-10-05 LAB — BASIC METABOLIC PANEL
CO2: 29 mEq/L (ref 19–32)
Calcium: 9.1 mg/dL (ref 8.4–10.5)
Chloride: 100 mEq/L (ref 96–112)
Glucose, Bld: 102 mg/dL — ABNORMAL HIGH (ref 70–99)
Sodium: 137 mEq/L (ref 135–145)

## 2010-10-06 LAB — LEGIONELLA PROFILE(CULTURE+DFA/SMEAR)

## 2010-10-07 ENCOUNTER — Telehealth: Payer: Self-pay | Admitting: Critical Care Medicine

## 2010-10-07 NOTE — Telephone Encounter (Signed)
Called, spoke with pt's wife.  She is requesting to move appt with Dr. Delford Field on July 25 to July 24 d/t transportation issues.  She is requesting for pt to see PW that evening. However, he is not here.  She would like something in the later morning if possible so their daughter can hopefully get off work and bring them.  I offered Tuesday, July 24 at 11am ( will double book if this time is ok with them).  She will call back after she speaks to her daughter about this time.  Note: when steve had called to request HFU - he wanted pt to see PW, not NP.

## 2010-10-07 NOTE — Telephone Encounter (Signed)
Pt wife called back and Tuesday at 11am is ok with them so appt changed. Carron Curie, CMA

## 2010-10-11 ENCOUNTER — Encounter: Payer: Self-pay | Admitting: Critical Care Medicine

## 2010-10-11 ENCOUNTER — Ambulatory Visit (INDEPENDENT_AMBULATORY_CARE_PROVIDER_SITE_OTHER): Payer: Medicare Other | Admitting: Critical Care Medicine

## 2010-10-11 ENCOUNTER — Ambulatory Visit (INDEPENDENT_AMBULATORY_CARE_PROVIDER_SITE_OTHER)
Admission: RE | Admit: 2010-10-11 | Discharge: 2010-10-11 | Disposition: A | Discharge status: Home / Self Care | Payer: Medicare Other | Source: Ambulatory Visit | Attending: Critical Care Medicine | Admitting: Critical Care Medicine

## 2010-10-11 ENCOUNTER — Other Ambulatory Visit: Payer: Medicare Other

## 2010-10-11 VITALS — BP 140/60 | HR 67 | Temp 97.9°F | Ht 71.0 in | Wt 194.0 lb

## 2010-10-11 DIAGNOSIS — J8489 Other specified interstitial pulmonary diseases: Secondary | ICD-10-CM

## 2010-10-11 DIAGNOSIS — J8409 Other alveolar and parieto-alveolar conditions: Secondary | ICD-10-CM

## 2010-10-11 DIAGNOSIS — J189 Pneumonia, unspecified organism: Secondary | ICD-10-CM

## 2010-10-11 DIAGNOSIS — J8410 Pulmonary fibrosis, unspecified: Secondary | ICD-10-CM

## 2010-10-11 HISTORY — DX: Pulmonary fibrosis, unspecified: J84.10

## 2010-10-11 MED ORDER — PREDNISONE 20 MG PO TABS: ORAL_TABLET | ORAL | Status: DC

## 2010-10-11 MED ORDER — BECLOMETHASONE DIPROPIONATE 80 MCG/ACT IN AERS: 2.0000 | INHALATION_SPRAY | Freq: Two times a day (BID) | RESPIRATORY_TRACT | Status: DC

## 2010-10-11 NOTE — Progress Notes (Signed)
Subjective:    Patient ID: Matthew Bridges, male    DOB: [birth year], 77 y.o.   MRN: [id number]  HPI  77 yo WM with Golds Stage II COPD  May 03, 2009 3:18 PM  Pt is noting increased cough, and has been ill for two weeks. Pt thinks he caught it from the spouse. PCP rx amoxcil 500 three times a day for 10days. No real change with amoxil. Has thick yellow mucous that is hard to raise. No chest pain. No real fever.  Pt denies any significant sore throat, nasal congestion or fever, chills, sweats, unintended weight loss, pleurtic or exertional chest pain, orthopnea PND, or leg swelling  May 21, 2009 1:43 PM  at last ov we rx:  A depomedrol 120mg  injection will be given  Avelox one daily for 5 days  Prednisone 10mg  4 each am x3days, 3 x 3days, 2 x 3days, 1 x 3days then stop  Dulera two puffs twice a day until samples gone  Now mucous is white. no real wheeze, the dulera is helping. pred helped and then as tapered was worse.   09/26/10 Since last ov 4/11:  Ill for two weeks dx bronchitis.  Then went to the beach and worse.  ABX Amoxicillin,  pcn shot,  Depo shot.  Pred x 8days 4/3/2/1.  Zpak. Despite this no better.  Mucinex also given .   Still coughing and hard to raise.  Hurts to cough.  Proair Rx  Pt has not responded to outpt rx  10/11/2010 D yspnea is better.  Sent home on oxygen.  Notes tightness with coughing.  Mucus is still productive of clear and foamy.  Not as bad with dyspnea. No Nausea or emesis.  No edema in feet,   ? Mold exposure with windows.    Past Medical History  Diagnosis Date  . Bronchiectasis   . Nephrolithiasis   . Pneumonia   . Hyperlipidemia   . Insomnia   . Alcohol abuse   . Asthmatic bronchitis   . Pleurisy   . GERD (gastroesophageal reflux disease)   . Diverticulosis     Colonoscopy 2007/Dr. Leone Payor  . Hx of colonic polyp      Family History  Problem Relation Age of Onset  . Heart disease Mother   . Colon cancer Father   . Colon cancer  Brother      History   Social History  . Marital Status: Married    Spouse Name: N/A    Number of Children: N/A  . Years of Education: N/A   Occupational History  . Retired    Social History Main Topics  . Smoking status: Former Smoker -- 0.5 packs/day for 15 years    Types: Cigarettes    Quit date: 03/20/1956  . Smokeless tobacco: Former Neurosurgeon    Types: Snuff, Chew  . Alcohol Use: Yes  . Drug Use: No  . Sexually Active: Not on file   Other Topics Concern  . Not on file   Social History Narrative  . No narrative on file     Allergies  Allergen Reactions  . Codeine      Outpatient Prescriptions Prior to Visit  Medication Sig Dispense Refill  . albuterol (PROAIR HFA) 108 (90 BASE) MCG/ACT inhaler Inhale 2 puffs into the lungs 4 (four) times daily as needed.       Marland Kitchen aspirin 81 MG tablet Take 81 mg by mouth daily.        Marland Kitchen guaifenesin (HUMIBID E)  400 MG TABS Take 400 mg by mouth 2 (two) times daily.       Marland Kitchen LORazepam (ATIVAN) 1 MG tablet Take 1 mg by mouth daily.       Marland Kitchen omeprazole (PRILOSEC) 20 MG capsule Take 20 mg by mouth daily.            Review of Systems  Constitutional:   No  weight loss, night sweats,  Fevers, chills, fatigue, lassitude. HEENT:   No headaches,  Difficulty swallowing,  Tooth/dental problems,  Sore throat,                No sneezing, itching, ear ache, nasal congestion, post nasal drip,   CV:  Notes  chest pain,  Orthopnea, PND, swelling in lower extremities, anasarca, dizziness, palpitations  GI  No heartburn, indigestion, abdominal pain, nausea, vomiting, diarrhea, change in bowel habits, loss of appetite  Resp: Notes  shortness of breath with exertion or at rest.  No excess mucus, notes  productive cough,  No non-productive cough,  No coughing up of blood.  Notes  change in color of mucus.  Notes  wheezing.  No chest wall deformity  Skin: no rash or lesions.  GU: no dysuria, change in color of urine, no urgency or frequency.  No flank  pain.  MS:  No joint pain or swelling.  No decreased range of motion.  No back pain.  Psych:  No change in mood or affect. No depression or anxiety.  No memory loss.     Objective:   Physical Exam  Filed Vitals:   10/11/10 1125  BP: 140/60  Pulse: 67  Temp: 97.9 F (36.6 C)  TempSrc: Oral  Height: 5\' 11"  (1.803 m)  Weight: 194 lb (87.998 kg)  SpO2: 94%    Gen: Pleasant, well-nourished, in no distress,  normal affect  ENT: No lesions,  mouth clear,  oropharynx clear, no postnasal drip  Neck: No JVD, no TMG, no carotid bruits  Lungs: No use of accessory muscles, no dullness to percussion,  Improved airflow  Cardiovascular: RRR, heart sounds normal, no murmur or gallops, no peripheral edema  Abdomen: soft and NT, no HSM,  BS normal  Musculoskeletal: No deformities, no cyanosis or clubbing  Neuro: alert, non focal  Skin: Warm, no lesions or rashes     CXR 10/11/10 IMPRESSION: No significant change in bilateral airspace opacities superimposed on chronic interstitial fibrotic disease.     Assessment & Plan:   Organized pneumonia Prob Boop vs hypersensitivity pneumonitis in setting of bronchiectasis. Neg FOB for pathogens or CA 7/12  Plan Pred taper  No further ABX Serology for hypersensitivity See orders     Updated Medication List Outpatient Encounter Prescriptions as of 10/11/2010  Medication Sig Dispense Refill  . albuterol (PROAIR HFA) 108 (90 BASE) MCG/ACT inhaler Inhale 2 puffs into the lungs 4 (four) times daily as needed.       Marland Kitchen aspirin 81 MG tablet Take 81 mg by mouth daily.        Marland Kitchen guaifenesin (HUMIBID E) 400 MG TABS Take 400 mg by mouth 2 (two) times daily.       Marland Kitchen LORazepam (ATIVAN) 1 MG tablet Take 1 mg by mouth daily.       Marland Kitchen omeprazole (PRILOSEC) 20 MG capsule Take 20 mg by mouth daily.        . predniSONE (DELTASONE) 20 MG tablet Reduce to 30mg  daily then reduce by 10mg  every 7days until at 10mg  daily and stay       .  DISCONTD:  predniSONE (DELTASONE) 20 MG tablet Take 40 mg by mouth daily.        . beclomethasone (QVAR) 80 MCG/ACT inhaler Inhale 2 puffs into the lungs 2 (two) times daily.  1 Inhaler  6

## 2010-10-11 NOTE — Assessment & Plan Note (Signed)
Prob Boop vs hypersensitivity pneumonitis in setting of bronchiectasis. Neg FOB for pathogens or CA 7/12  Plan Pred taper  No further ABX Serology for hypersensitivity See orders

## 2010-10-11 NOTE — Patient Instructions (Addendum)
Qvar 80 mcg two puffs twice daily Prednisone 10mg  , reduce to 30mg  daily for 7days then reduce by one every 7days until at 10mg  daily and stay No other medication changes Labs today Return 6 weeks

## 2010-10-11 NOTE — Op Note (Signed)
  NAMEBENTON, TOOKER            ACCOUNT NO.:  [id number]  MEDICAL RECORD NO.:  [id number]  LOCATION:  1315                         FACILITY:  [facility]  PHYSICIAN:  Charlcie Cradle. Delford Field, MD, FCCPDATE OF BIRTH:  [birth year]  DATE OF PROCEDURE:  09/30/2010 DATE OF DISCHARGE:                              OPERATIVE REPORT   PROCEDURE:  Bronchoscopy.  INDICATION:  Bilateral infiltrates, evaluate for cause.  OPERATOR:  Charlcie Cradle. Delford Field, MD, FCCP.  ANESTHESIA:  Xylocaine local 1%.  PREOP MEDICATIONS:  Fentanyl 30 mcg, Versed 3 mg IV push.  PROCEDURE IN DETAIL:  The Pentax video bronchoscope was introduced via the right naris.  The upper airways were visualized, were unremarkable. The entire tracheobronchial tree was visualized, revealed that he had diffuse tracheobronchitis with mucous plugging.  Biopsies were taken from the right lower lobe.  Bronchial washings were taken.  Impression of this was that of tracheobronchitis with mucous plugging.  RECOMMENDATIONS:  Follow up micropathology.     Charlcie Cradle Delford Field, MD, [facility]     PEW/MEDQ  D:  09/30/2010  T:  09/30/2010  Job:  [id number]  Electronically Signed by Shan Levans MD FCCP on 10/11/2010 03:43:28 PM

## 2010-10-12 ENCOUNTER — Ambulatory Visit: Payer: Medicare Other | Admitting: Critical Care Medicine

## 2010-10-13 ENCOUNTER — Telehealth: Payer: Self-pay | Admitting: Critical Care Medicine

## 2010-10-13 NOTE — Telephone Encounter (Signed)
Called and spoke with pt's wife.  Wife aware of PW's recs.  Wife verbalized understanding and denied any questions.

## 2010-10-13 NOTE — Telephone Encounter (Signed)
He has hives or something broker out all over him he needs something for him. Pharm-[facility].

## 2010-10-13 NOTE — Telephone Encounter (Signed)
Called and spoke with pt's wife.  Wife states pt has hives on his back, legs, and stomach x 2 1/2 days.  States pt c/o hives burning and stinging.  Wife states pt has had this before and believes it's brought on when pt "worries too much."  Wife denies any environmental contact that could have caused this- states pt hasn't left the home other than for dr appts.  Wife also denies pt taking any new meds.  I advised wife to contact pt's PCP regarding this.  She states she called Dr. Tomasa Blase who instructed her to have pt take cortisone cream and benadryl (which pt has been taking with no relief of symptoms) and to call our office for any further recs by PW.  Pw, please advise.  Thanks.

## 2010-10-13 NOTE — Telephone Encounter (Signed)
Keep area dry and not heated. Apply Desitin cream on site (diaper rash type cream)

## 2010-10-14 LAB — ALLERGY FULL PROFILE
Allergen,Goose feathers, e70: <0.1 kU/L (ref <0.35)
Alternaria Alternata: <0.1 kU/L (ref <0.35)
Bermuda Grass: 0.11 kU/L (ref <0.35)
Box Elder IgE: <0.1 kU/L (ref <0.35)
Curvularia lunata: <0.1 kU/L (ref <0.35)
Dog Dander: <0.1 kU/L (ref <0.35)
Fescue: <0.1 kU/L (ref <0.35)
G005 Rye, Perennial: <0.1 kU/L (ref <0.35)
Goldenrod: 0.11 kU/L (ref <0.35)
Helminthosporium halodes: 0.26 kU/L (ref <0.35)
IgE (Immunoglobulin E), Serum: 335.6 IU/mL — ABNORMAL HIGH (ref 0.0–180.0)
Lamb's Quarters: <0.1 kU/L (ref <0.35)
Stemphylium Botryosum: <0.1 kU/L (ref <0.35)

## 2010-10-14 NOTE — Progress Notes (Signed)
Quick Note:  Called, spoke with pt's wife. She is aware of lab results and recs per PW. She verbalized understanding of this and will inform pt. ______

## 2010-10-17 LAB — FUNGAL ANTIBODIES PANEL, ID-BLOOD
Aspergillus Niger Antibodies: NEGATIVE
Aspergillus fumigatus: NEGATIVE
Blastomyces Abs, Qn, DID: NEGATIVE
Coccidioides Antibody ID: NEGATIVE
Histoplasma Antibody, ID: NEGATIVE

## 2010-10-17 LAB — HYPERSENSITIVITY PNUEMONITIS PROFILE

## 2010-10-20 ENCOUNTER — Telehealth: Payer: Self-pay | Admitting: Critical Care Medicine

## 2010-10-20 DIAGNOSIS — J8489 Other specified interstitial pulmonary diseases: Secondary | ICD-10-CM

## 2010-10-20 NOTE — Telephone Encounter (Signed)
Pt seen by PEW in office for HFU 7.24.12 and given pred taper at ov.  LMOM TCB x1.

## 2010-10-21 ENCOUNTER — Telehealth: Payer: Self-pay | Admitting: Critical Care Medicine

## 2010-10-21 MED ORDER — PREDNISONE 10 MG PO TABS: 10.0000 mg | ORAL_TABLET | Freq: Every day | ORAL | Status: DC

## 2010-10-21 NOTE — Telephone Encounter (Signed)
LMTCB

## 2010-10-21 NOTE — Telephone Encounter (Signed)
Called spoke with patient's wife, advised of RB's recs.  Pt's wife verbalized her understanding but stated that they need a refill on the prednisone.  Per PEW's last note, pt was to decrease the prednisone to 10 daily and stay.  Refill sent to verified pharmacy.  Pt has upcoming appt with PEW 9.11.12 and advised to keep this appt.  Pt's wife verbalized her understanding.

## 2010-10-21 NOTE — Telephone Encounter (Signed)
Called and spoke with pt and he stated that he has 1 pill of the prednisone left.  Was told to cont the pred as prescribed, but pt is still having problems with white congestion at night and in the morning.  They wanted to know if he needs to stay on the prednisone or is it ok to stop after the last pill.  PW is not in the office today.  Please advise. thanks

## 2010-10-21 NOTE — Telephone Encounter (Signed)
As long as pt is not having worsening SOB, wheezing, colored sputum, I believe it is OK for him to finish the taper as planned by Dr Delford Field

## 2010-10-21 NOTE — Telephone Encounter (Signed)
Spoke with patient's son Matthew Bridges who is requesting a letter from PEW stating that patient does have black mold disease and how this could have been contracted (ie: air exposure to black mold in the home) so that this can be presented to pt's attorney.  Matthew Bridges reports that they have approached pt's landlord about reimbursing the security deposit and not charging x2weeks while patient recuperates but landlord is "being difficult."  All of patient's furniture is having to be thrown out / replaced.  Matthew Bridges is aware that PEW is out of the office until Monday afternoon and is okay with this.  Requests to pick up letter when ready.  [phone].  Dr Delford Field, please advise.  Thanks!

## 2010-10-24 NOTE — Telephone Encounter (Signed)
UNABLE TO REACH DUE TO PROBLEMS WITH THEIR PHONE IT RINGS BUT WHEN THEY P/U YOU CAN NOT HEAR THEM WILL TRY BACK LATER

## 2010-10-24 NOTE — Telephone Encounter (Signed)
I cannot write such a letter,  I have no direct proof the mold in the home was the chief cause for this pts illness  It is only a hypothesis.  I am happy to provide all records to the pts atty for his review,  I cannot be the judge heree

## 2010-10-25 ENCOUNTER — Telehealth: Payer: Self-pay | Admitting: Critical Care Medicine

## 2010-10-25 NOTE — Telephone Encounter (Signed)
Spoke with Ree Kida and he states pt would like to get an RX for a 4 wheeled walker with seat. Ree Kida states pt gets tired and carries o2 around and just wants this to have around for when he gets tired to sit down. Please advise if okay to send RX Dr. Delford Field. Thanks  Carver Fila, CMA

## 2010-10-25 NOTE — Telephone Encounter (Signed)
No problem This is commonly called a rollalator.  Ok to order

## 2010-10-25 NOTE — Discharge Summary (Signed)
Matthew, Bridges            ACCOUNT NO.:  [id number]  MEDICAL RECORD NO.:  [id number]  LOCATION:  1315                         FACILITY:  [facility]  PHYSICIAN:  Charlaine Dalton. 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  HISTORY OF PRESENT ILLNESS:  Matthew Bridges is a 77 year old white male, patient of Dr. Shan Levans, with the GOLD stage II chronic obstructive pulmonary disease.  He noted increasing cough and had been ill for approximately 2 weeks prior to admission.  There was suspected infectious process; therefore, he had been treated as an outpatient with antibiotics and proved refractory to treatment.  He had continuous thick yellow secretion.  He is admitted for further evaluation and treatment of suspected infection.  He has had a past medical history significant for bronchiectasis, nephrolithiasis pneumonia, hyperlipidemia, insomnia, alcohol abuse, asthmatic bronchitis, pleurisy, gastroesophageal reflux disease, diverticulosis, and history of colonic polyps.  LABORATORY DATA:  He underwent a fiberoptic bronchoscopy per Dr. Shan Levans on September 30, 2010.  Airways were visualized and were unremarkable. The entire tracheobronchial tree was visualized, revealed that he had diffuse tracheobronchitis with mucus plugging.  Biopsies were taken for right lower lobe, bronchial washings were taken.  Impression was that he had tracheal bronchitis with mucus plugging.  Further lab data, hemoglobin 12.3, hematocrit 36.4, WBC 10.7, platelets 172.  Sodium 137, potassium 3.8, chloride 100, CO2 of 29, BUN is 21, creatinine 0.66, glucose 102, calcium was  9.1.  His ESR was noted to be 72.  His IgE serum was noted to be 406.   Troponin I has been negative.  Biopsies of bronchial washings are incomplete at this time.  HOSPITAL COURSE BY DISCHARGE DIAGNOSES:  Hypoxia in the setting of mucus plugging and with an elevated sed rate consistent with inflamation.  He is treated with antimicrobial therapy consisting of cefepime and Zithromax which he completed a 9-day course of antibiotics.  He was placed on high-dose steroids initially with IV Solu-Medrol.  He has been decreased to prednisone and will be on 40 mg b.i.d. at the time of discharge.  He will follow with Dr. Shan Levans as an outpatient for further evaluation and treatment.  Note that he will be O2 dependent as documented by his O2 sats being less than 88% on room air.  DISCHARGE MEDICATIONS: 1. He will be on prednisone 40 mg b.i.d. for 5 days, then 40 mg daily     until evaluated by Dr. Shan Levans. 2. Flomax 0.4 mg daily.  This is a new medication. 3. Ventolin MDI 2 puffs inhaled 4 times a day as needed. 4. Aspirin 81 mg daily. 5. Guaifenesin 400 mg twice daily. 6. Lorazepam 1 mg at bedtime as needed for anxiety. 7. Omeprazole 20 mg daily. He should be on a heart-healthy low-sodium diet.  He has got a followup appointment with Dr. Shan Levans on October 12, 2010, at 10:30 a.m.  He will be on home  O2 at 2 L rest, 3 L with activity.  He has been discharged in improved condition.     Devra Dopp, MSN, ACNP   ______________________________ Charlaine Dalton. Sherene Sires, MD, FCCP    SM/MEDQ  D:  10/05/2010  T:  10/05/2010  Job:  [id number]  Electronically Signed by Devra Dopp MSN ACNP on 10/21/2010 11:03:49 AM Electronically Signed by Sandrea Hughs MD FCCP on 10/25/2010 04:04:41 PM

## 2010-10-25 NOTE — Telephone Encounter (Signed)
Today got VM system--[id number]

## 2010-10-25 NOTE — Telephone Encounter (Signed)
Order for rollalator was faxed to [facility]. I spoke with Ree Kida and notified that this was done.

## 2010-10-26 NOTE — Telephone Encounter (Signed)
Called spoke with patient's son Matthew Bridges, advised of PEW's recs.  Pt's son verbalized his understanding but now would like to know if PEW can write a letter simply stating that patient had black mold disease, in your medical opinion.  [phone].  Advised Ronnie that PEW stated that the black mold may have contributed to pt's illness so unsure if letter of this type will be appropriate.    Dr Delford Field, please advise, thanks!

## 2010-10-27 ENCOUNTER — Telehealth: Payer: Self-pay | Admitting: Critical Care Medicine

## 2010-10-27 MED ORDER — NYSTATIN 100000 UNIT/GM EX CREA: TOPICAL_CREAM | Freq: Three times a day (TID) | CUTANEOUS | Status: DC

## 2010-10-27 NOTE — Telephone Encounter (Signed)
I cannot write a letter that categorically states beyond any reasonable doubt that black mold was the primary cause of his illness  The pt's records that I have are certainly welcome to be shared with the patients attorney  pw

## 2010-10-27 NOTE — Telephone Encounter (Signed)
For the rash, offer nystatin oint 100,000 units/ gram   1 tube/ 30 grams   Apply to rash times daily   Refill x 1

## 2010-10-27 NOTE — Telephone Encounter (Signed)
Encounter closed in error.

## 2010-10-27 NOTE — Telephone Encounter (Signed)
Spoke with pt's spouse. She states that pt is c/o tightness in his chest, prod cough with white sputum, bloody nasal d/c and rash. She states that the rash which is on his stomach and back started during hospital stay end of July and is no better since then. She states that "looks like someone beat him with a whip". She states that while in the hospital, they applied cold compresses and so she has tried this, as well as diaper rash cream and benadryl with little relief. I advised that he should come in for appt to address all of this and she declined stating has no transportation, and was just here for post hosp with PW on 10/11/10. Will forward to doc of the day. Please advise, thanks! Allergies  Allergen Reactions  . Codeine

## 2010-10-27 NOTE — Telephone Encounter (Signed)
Spoke with pt and notified of recs per CDY. He verbalized understanding and rx was called to pharm.

## 2010-10-27 NOTE — Telephone Encounter (Signed)
Addended by: Christen Butter on: 10/27/2010 06:00 PM   Modules accepted: Orders

## 2010-10-27 NOTE — Telephone Encounter (Signed)
[email or web address] Catalfamo, CMA  

## 2010-10-28 LAB — FUNGUS CULTURE W SMEAR

## 2010-10-28 NOTE — Telephone Encounter (Signed)
LMTCB

## 2010-10-31 NOTE — Telephone Encounter (Signed)
[email or web address]

## 2010-11-01 ENCOUNTER — Ambulatory Visit
Admission: RE | Admit: 2010-11-01 | Discharge: 2010-11-01 | Disposition: A | Discharge status: Home / Self Care | Payer: Medicare Other | Source: Ambulatory Visit | Attending: Adult Health | Admitting: Adult Health

## 2010-11-01 ENCOUNTER — Other Ambulatory Visit (INDEPENDENT_AMBULATORY_CARE_PROVIDER_SITE_OTHER): Payer: Medicare Other

## 2010-11-01 ENCOUNTER — Ambulatory Visit (INDEPENDENT_AMBULATORY_CARE_PROVIDER_SITE_OTHER)
Admission: RE | Admit: 2010-11-01 | Discharge: 2010-11-01 | Disposition: A | Discharge status: Home / Self Care | Payer: Medicare Other | Source: Ambulatory Visit | Attending: Adult Health | Admitting: Adult Health

## 2010-11-01 ENCOUNTER — Ambulatory Visit (INDEPENDENT_AMBULATORY_CARE_PROVIDER_SITE_OTHER): Payer: Medicare Other | Admitting: Adult Health

## 2010-11-01 ENCOUNTER — Encounter: Payer: Self-pay | Admitting: Adult Health

## 2010-11-01 VITALS — BP 110/70 | HR 106 | Temp 97.5°F | Ht 71.0 in | Wt 191.0 lb

## 2010-11-01 DIAGNOSIS — J449 Chronic obstructive pulmonary disease, unspecified: Secondary | ICD-10-CM

## 2010-11-01 DIAGNOSIS — J8409 Other alveolar and parieto-alveolar conditions: Secondary | ICD-10-CM

## 2010-11-01 DIAGNOSIS — J8489 Other specified interstitial pulmonary diseases: Secondary | ICD-10-CM

## 2010-11-01 DIAGNOSIS — J4489 Other specified chronic obstructive pulmonary disease: Secondary | ICD-10-CM

## 2010-11-01 DIAGNOSIS — R0602 Shortness of breath: Secondary | ICD-10-CM

## 2010-11-01 LAB — CBC WITH DIFFERENTIAL/PLATELET
Basophils Relative: 0.2 % (ref 0.0–3.0)
Eosinophils Absolute: 0.3 10*3/uL (ref 0.0–0.7)
Eosinophils Relative: 3 % (ref 0.0–5.0)
Hemoglobin: 13 g/dL (ref 13.0–17.0)
Lymphocytes Relative: 19.1 % (ref 12.0–46.0)
MCHC: 34.1 g/dL (ref 30.0–36.0)
MCV: 95.4 fl (ref 78.0–100.0)
Neutro Abs: 8 10*3/uL — ABNORMAL HIGH (ref 1.4–7.7)
Neutrophils Relative %: 70.6 % (ref 43.0–77.0)
RBC: 4 Mil/uL — ABNORMAL LOW (ref 4.22–5.81)
WBC: 11.4 10*3/uL — ABNORMAL HIGH (ref 4.5–10.5)

## 2010-11-01 LAB — BRAIN NATRIURETIC PEPTIDE: Pro B Natriuretic peptide (BNP): 30 pg/mL (ref 0.0–100.0)

## 2010-11-01 MED ORDER — PREDNISONE 10 MG PO TABS: ORAL_TABLET | ORAL | Status: DC

## 2010-11-01 MED ORDER — HYDROCODONE-HOMATROPINE 5-1.5 MG/5ML PO SYRP: 5.0000 mL | ORAL_SOLUTION | Freq: Four times a day (QID) | ORAL | Status: AC | PRN

## 2010-11-01 NOTE — Progress Notes (Signed)
Subjective:    Patient ID: Matthew Bridges, male    DOB: [birth year], 77 y.o.   MRN: [id number]  HPI  77 yo WM with Golds Stage II COPD   May 03, 2009 Pt is noting increased cough, and has been ill for two weeks. Pt thinks he caught it from the spouse. PCP rx amoxcil 500 three times a day for 10days. No real change with amoxil. Has thick yellow mucous that is hard to raise. No chest pain. No real fever.  Pt denies any significant sore throat, nasal congestion or fever, chills, sweats, unintended weight loss, pleurtic or exertional chest pain, orthopnea PND, or leg swelling   May 21, 2009 1:43 PM  at last ov we rx:  A depomedrol 120mg  injection will be given  Avelox one daily for 5 days  Prednisone 10mg  4 each am x3days, 3 x 3days, 2 x 3days, 1 x 3days then stop  Dulera two puffs twice a day until samples gone  Now mucous is white. no real wheeze, the dulera is helping. pred helped and then as tapered was worse.   09/26/10 Since last ov 4/11:  Ill for two weeks dx bronchitis.  Then went to the beach and worse.  ABX Amoxicillin,  pcn shot,  Depo shot.  Pred x 8days 4/3/2/1.  Zpak. Despite this no better.  Mucinex also given .   Still coughing and hard to raise.  Hurts to cough.  Proair Rx  Pt has not responded to outpt rx>>Admitted   10/11/10  D yspnea is better.  Sent home on oxygen.  Notes tightness with coughing.  Mucus is still productive of clear and foamy.  Not as bad with dyspnea.No Nausea or emesis.  No edema in feet,   ? Mold exposure with windows.>taper prednisone Vickie Epley  11/01/2010 Acute OV  Pt complains he does not feel any better with dyspnea, cough-dry. Cough keeps him up at night. Wears out easily. Runs out of air with minimal acitivity. They have moved out of their home due to mold. Now living in an apartment.   Pt was admitted 7/9-7/18 for new onset of hypoxic resp failure /copd exacerbation . 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 ESR was ~72.  He was discharged on  40mg  of prednisone daily . This has been tapered and he is now on 10 mg daily . He underwent cxr last ov which showed no change in bilateral aspdz. Underwent extensive labs that showed allergy profile  elevated IGE ~300. W/ neg profile.   Aspergillious abx  /fungal test have been neg to date.   Family is with pt today , they are very concerned that this is due to mold issues in the home. Time of illness was after cleaning mold on window. Then mold was found throughout the home. As above they have moved out of this home.   He remains very weak . Appetite is good. Feels is hard to get in enough air at times when he is moving around. Cough comes and goes but keeps him awake a lot.   Past Medical History  Diagnosis Date  . Bronchiectasis   . Nephrolithiasis   . Pneumonia   . Hyperlipidemia   . Insomnia   . Alcohol abuse   . Asthmatic bronchitis   . Pleurisy   . GERD (gastroesophageal reflux disease)   . Diverticulosis     Colonoscopy 2007/Dr. Leone Payor  . Hx of colonic polyp      Family History  Problem Relation  Age of Onset  . Heart disease Mother   . Colon cancer Father   . Colon cancer Brother      History   Social History  . Marital Status: Married    Spouse Name: N/A    Number of Children: N/A  . Years of Education: N/A   Occupational History  . Retired    Social History Main Topics  . Smoking status: Former Smoker -- 0.5 packs/day for 15 years    Types: Cigarettes    Quit date: 03/20/1956  . Smokeless tobacco: Former Neurosurgeon    Types: Snuff, Chew  . Alcohol Use: Yes  . Drug Use: No  . Sexually Active: Not on file   Other Topics Concern  . Not on file   Social History Narrative  . No narrative on file     Allergies  Allergen Reactions  . Codeine      Outpatient Prescriptions Prior to Visit  Medication Sig Dispense Refill  . albuterol (PROAIR HFA) 108 (90 BASE) MCG/ACT inhaler Inhale 2 puffs into the lungs 4 (four) times daily as needed.       Marland Kitchen aspirin 81  MG tablet Take 81 mg by mouth daily.        . beclomethasone (QVAR) 80 MCG/ACT inhaler Inhale 2 puffs into the lungs 2 (two) times daily.  1 Inhaler  6  . guaifenesin (HUMIBID E) 400 MG TABS Take 400 mg by mouth 2 (two) times daily.       Marland Kitchen LORazepam (ATIVAN) 1 MG tablet Take 1 mg by mouth daily.       Marland Kitchen nystatin (MYCOSTATIN) cream Apply topically 3 (three) times daily.  30 g  1  . omeprazole (PRILOSEC) 20 MG capsule Take 20 mg by mouth daily.        . predniSONE (DELTASONE) 10 MG tablet Take 1 tablet (10 mg total) by mouth daily.  30 tablet  1      Review of Systems  Constitutional:   No  weight loss, night sweats,  Fevers, chills, + fatigue, lassitude. HEENT:   No headaches,  Difficulty swallowing,  Tooth/dental problems,  Sore throat,                No sneezing, itching, ear ache, nasal congestion, post nasal drip,   CV:  Notes  chest pain,  Orthopnea, PND, swelling in lower extremities, anasarca, dizziness, palpitations  GI  No heartburn, indigestion, abdominal pain, nausea, vomiting, diarrhea, change in bowel habits, loss of appetite  Resp: Notes  shortness of breath with exertion or at rest.  No excess mucus, notes  productive cough,   No coughing up of blood.  Notes  change in color of mucus. No chest wall deformity  Skin: no rash or lesions.  GU: no dysuria, change in color of urine, no urgency or frequency.  No flank pain.  MS:  No joint pain or swelling.  No decreased range of motion.  No back pain.  Psych:  No change in mood or affect. No depression or anxiety.  No memory loss.     Objective:   Physical Exam  Filed Vitals:   11/01/10 1459  BP: 110/70  Pulse: 106  Temp: 97.5 F (36.4 C)  TempSrc: Oral  Height: 5\' 11"  (1.803 m)  Weight: 191 lb (86.637 kg)  SpO2: 91%    Gen: Pleasant, well-nourished, in no distress,  normal affect  ENT: No lesions,  mouth clear,  oropharynx clear, no postnasal drip  Neck:  No JVD, no TMG, no carotid bruits  Lungs: No use  of accessory muscles, no dullness to percussion,  Coarse BS   Cardiovascular: RRR, heart sounds normal, no murmur or gallops, no peripheral edema  Abdomen: soft and NT, no HSM,  BS normal  Musculoskeletal: No deformities, no cyanosis or clubbing  Neuro: alert, non focal  Skin: Warm, no lesions or rashes         Assessment & Plan:   No problem-specific assessment & plan notes found for this encounter.   Updated Medication List Outpatient Encounter Prescriptions as of 11/01/2010  Medication Sig Dispense Refill  . albuterol (PROAIR HFA) 108 (90 BASE) MCG/ACT inhaler Inhale 2 puffs into the lungs 4 (four) times daily as needed.       Marland Kitchen aspirin 81 MG tablet Take 81 mg by mouth daily.        . beclomethasone (QVAR) 80 MCG/ACT inhaler Inhale 2 puffs into the lungs 2 (two) times daily.  1 Inhaler  6  . guaifenesin (HUMIBID E) 400 MG TABS Take 400 mg by mouth 2 (two) times daily.       Marland Kitchen LORazepam (ATIVAN) 1 MG tablet Take 1 mg by mouth daily.       Marland Kitchen nystatin (MYCOSTATIN) cream Apply topically 3 (three) times daily.  30 g  1  . omeprazole (PRILOSEC) 20 MG capsule Take 20 mg by mouth daily.        . predniSONE (DELTASONE) 10 MG tablet Take 1 tablet (10 mg total) by mouth daily.  30 tablet  1  . HYDROcodone-homatropine (HYDROMET) 5-1.5 MG/5ML syrup Take 5 mLs by mouth every 6 (six) hours as needed for cough.  240 mL  0  . predniSONE (DELTASONE) 10 MG tablet 4 tabs daily for 5 days , then 2 tabs daily and hold at this dose  75 tablet  0

## 2010-11-01 NOTE — Telephone Encounter (Signed)
Spoke with patients wife-aware that PW will not write letter but medical records can be given to [facility]ty. Pt will need to come by medical records and sign release of records form to send records to atty. Spouse is aware and understands.

## 2010-11-01 NOTE — Patient Instructions (Addendum)
Increase Prednisone 40mg  daily for 5 days then 20mg  daily until seen back in office I will call with lab and xray results.  GERD Diet.  Mucinex DM Twice daily  As needed  Cough/congestion  Hydromet 1 tsp every 8 hr As needed  Cough -may make you sleepy  No eating 3 hrs prior to lying down.  follow up Dr. Delford Field  In 2 weeks and As needed   Please contact office for sooner follow up if symptoms do not improve or worsen or seek emergency care

## 2010-11-01 NOTE — Assessment & Plan Note (Addendum)
Suspected BOOP , with elevated ESR initially at 72 along w/ xr changes Labs  Show allergy profile elevated IGE ~300. W/ neg profile.  Aspergillious abx /fungal test have been neg to date Will treat with higher dose steroids to see if responsive, will taper to 20mg  and hold  May need higher dose for longer but will monitor response and check ESR-serially.   Plan:  ncrease Prednisone 40mg  daily for 5 days then 20mg  daily until seen back in office I will call with lab and xray results.  GERD Diet.  Mucinex DM Twice daily  As needed  Cough/congestion  Hydromet 1 tsp every 8 hr As needed  Cough -may make you sleepy  No eating 3 hrs prior to lying down.  follow up Dr. Delford Field  In 2 weeks and As needed   Please contact office for sooner follow up if symptoms do not improve or worsen or seek emergency care

## 2010-11-02 ENCOUNTER — Encounter: Payer: Self-pay | Admitting: Adult Health

## 2010-11-02 NOTE — Progress Notes (Signed)
Agree with this plan of care 

## 2010-11-14 LAB — AFB CULTURE WITH SMEAR (NOT AT ARMC)

## 2010-11-15 ENCOUNTER — Encounter: Payer: Self-pay | Admitting: Critical Care Medicine

## 2010-11-15 ENCOUNTER — Ambulatory Visit (INDEPENDENT_AMBULATORY_CARE_PROVIDER_SITE_OTHER): Payer: Medicare Other | Admitting: Critical Care Medicine

## 2010-11-15 VITALS — BP 128/72 | HR 87 | Temp 97.5°F | Ht 62.0 in | Wt 196.0 lb

## 2010-11-15 DIAGNOSIS — J8489 Other specified interstitial pulmonary diseases: Secondary | ICD-10-CM

## 2010-11-15 DIAGNOSIS — J8409 Other alveolar and parieto-alveolar conditions: Secondary | ICD-10-CM

## 2010-11-15 MED ORDER — PREDNISONE 10 MG PO TABS: ORAL_TABLET | ORAL | Status: DC

## 2010-11-15 NOTE — Patient Instructions (Signed)
Reduce prednisone to 25mg  daily (2 1/2 tabs) for 10days then reduce to 20mg  daily (2 tabs) and stay No other medication changes Return 1 month

## 2010-11-15 NOTE — Progress Notes (Signed)
Subjective:    Patient ID: Matthew Bridges, male    DOB: September 18, 1930, 77 y.o.   MRN: [id number]  HPI  77 y.o.   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  Then went to the beach and worse.  ABX Amoxicillin,  pcn shot,  Depo shot.  Pred x 8days 4/3/2/1.  Zpak. Despite this no better.  Mucinex also given .   Still coughing and hard to raise.  Hurts to cough.  Proair Rx  Pt has not responded to outpt rx>>Admitted   10/11/10  D yspnea is better.  Sent home on oxygen.  Notes tightness with coughing.  Mucus is still productive of clear and foamy.  Not as bad with dyspnea.No Nausea or emesis.  No edema in feet,   ? Mold exposure with windows.>taper prednisone Vickie Epley  11/01/10 Acute OV  Pt complains he does not feel any better with dyspnea, cough-dry. Cough keeps him up at night. Wears out easily. Runs out of air with minimal acitivity. They have moved out of their home due to mold. Now living in an apartment.   Pt was admitted 7/9-7/18 for new onset of hypoxic resp failure /copd exacerbation . 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 ESR was ~72.  He was discharged on 40mg  of prednisone daily . This has been tapered and he is now on 10 mg daily . He underwent cxr last ov which showed no change in bilateral aspdz. Underwent extensive labs that showed allergy profile  elevated IGE ~300. W/ neg profile.   Aspergillious abx  /fungal test have been neg to date.   Family is with pt today , they are very concerned that this is due to mold issues in the home. Time of illness was after cleaning mold on window. Then mold was found throughout the home. As above they have moved out of this home.   He remains very weak . Appetite is good. Feels is hard to get in enough air at times when he is moving around. Cough comes and goes but keeps  him awake a lot.    11/16/2010 Pt with ongoing issues and was seen prior with NP on 8/14 Suspected BOOP , with elevated ESR initially at 72 along w/ xr changes  Labs Show allergy profile elevated IGE ~300. W/ neg profile.  Aspergillious abx /fungal test have been neg to date  Will treat with higher dose steroids to see if responsive, will taper to 20mg  and hold  May need higher dose for longer but will monitor response and check ESR-serially.  Plan:  ncrease Prednisone 40mg  daily for 5 days then 20mg  daily until seen back in office  I will call with lab and xray results.  GERD Diet.  Mucinex DM Twice daily As needed Cough/congestion  Hydromet 1 tsp every 8 hr As needed Cough -may make you sleepy  No eating 3 hrs prior to lying down.   Moved out of mold, out 11/21/10 out of the home.   Now coughing but is mucus clear.  Dyspnea is better.  No real chest pain.  No fever is noted.  Notes some edema in feet  Past Medical History  Diagnosis Date  . Bronchiectasis   . Nephrolithiasis   . Pneumonia   . Hyperlipidemia   . Insomnia   .  Alcohol abuse   . Asthmatic bronchitis   . Pleurisy   . GERD (gastroesophageal reflux disease)   . Diverticulosis     Colonoscopy 2007/Dr. Leone Payor  . Hx of colonic polyp      Family History  Problem Relation Age of Onset  . Heart disease Mother   . Colon cancer Father   . Colon cancer Brother      History   Social History  . Marital Status: Married    Spouse Name: N/A    Number of Children: N/A  . Years of Education: N/A   Occupational History  . Retired    Social History Main Topics  . Smoking status: Former Smoker -- 0.5 packs/day for 15 years    Types: Cigarettes    Quit date: 03/20/1956  . Smokeless tobacco: Former Neurosurgeon    Types: Snuff, Chew  . Alcohol Use: Yes  . Drug Use: No  . Sexually Active: Not on file   Other Topics Concern  . Not on file   Social History Narrative  . No narrative on file     Allergies  Allergen  Reactions  . Codeine      Outpatient Prescriptions Prior to Visit  Medication Sig Dispense Refill  . albuterol (PROAIR HFA) 108 (90 BASE) MCG/ACT inhaler Inhale 2 puffs into the lungs 4 (four) times daily as needed.       Marland Kitchen aspirin 81 MG tablet Take 81 mg by mouth daily.        . beclomethasone (QVAR) 80 MCG/ACT inhaler Inhale 2 puffs into the lungs 2 (two) times daily.  1 Inhaler  6  . guaifenesin (HUMIBID E) 400 MG TABS Take 400 mg by mouth 2 (two) times daily.       Marland Kitchen LORazepam (ATIVAN) 1 MG tablet Take 1 mg by mouth 2 (two) times daily.       Marland Kitchen omeprazole (PRILOSEC) 20 MG capsule Take 20 mg by mouth daily.        . predniSONE (DELTASONE) 10 MG tablet Take 1 tablet (10 mg total) by mouth daily.  30 tablet  1  . nystatin (MYCOSTATIN) cream Apply topically 3 (three) times daily.  30 g  1      Review of Systems  Constitutional:   No  weight loss, night sweats,  Fevers, chills, + fatigue, lassitude. HEENT:   No headaches,  Difficulty swallowing,  Tooth/dental problems,  Sore throat,                No sneezing, itching, ear ache, nasal congestion, post nasal drip,   CV:  Notes  chest pain,  Orthopnea, PND, swelling in lower extremities, anasarca, dizziness, palpitations  GI  No heartburn, indigestion, abdominal pain, nausea, vomiting, diarrhea, change in bowel habits, loss of appetite  Resp: Notes  shortness of breath with exertion or at rest.  No excess mucus, notes  productive cough,   No coughing up of blood.  Notes  change in color of mucus. No chest wall deformity  Skin: no rash or lesions.  GU: no dysuria, change in color of urine, no urgency or frequency.  No flank pain.  MS:  No joint pain or swelling.  No decreased range of motion.  No back pain.  Psych:  No change in mood or affect. No depression or anxiety.  No memory loss.     Objective:   Physical Exam  Filed Vitals:   11/15/10 1438  BP: 128/72  Pulse: 87  Temp: 97.5  F (36.4 C)  TempSrc: Oral  Height: 5'  2" (1.575 m)  Weight: 196 lb (88.905 kg)  SpO2: 95%    Gen: Pleasant, well-nourished, in no distress,  normal affect  ENT: No lesions,  mouth clear,  oropharynx clear, no postnasal drip  Neck: No JVD, no TMG, no carotid bruits  Lungs: No use of accessory muscles, no dullness to percussion,  Coarse BS   Cardiovascular: RRR, heart sounds normal, no murmur or gallops, no peripheral edema  Abdomen: soft and NT, no HSM,  BS normal  Musculoskeletal: No deformities, no cyanosis or clubbing  Neuro: alert, non focal  Skin: Warm, no lesions or rashes         Assessment & Plan:   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 Pt now moved out of moldy home. CXR improved 8/12 Plan Very slow pred taper.       Updated Medication List Outpatient Encounter Prescriptions as of 11/15/2010  Medication Sig Dispense Refill  . albuterol (PROAIR HFA) 108 (90 BASE) MCG/ACT inhaler Inhale 2 puffs into the lungs 4 (four) times daily as needed.       Marland Kitchen aspirin 81 MG tablet Take 81 mg by mouth daily.        . beclomethasone (QVAR) 80 MCG/ACT inhaler Inhale 2 puffs into the lungs 2 (two) times daily.  1 Inhaler  6  . guaifenesin (HUMIBID E) 400 MG TABS Take 400 mg by mouth 2 (two) times daily.       Marland Kitchen HYDROcodone-homatropine (HYDROMET) 5-1.5 MG/5ML syrup Take by mouth. 1 tsp every 8 hours as needed cough       . LORazepam (ATIVAN) 1 MG tablet Take 1 mg by mouth 2 (two) times daily.       Marland Kitchen omeprazole (PRILOSEC) 20 MG capsule Take 20 mg by mouth daily.        . predniSONE (DELTASONE) 10 MG tablet Reduce to 25mg  daily (2 1/2 tabs) for 10days then reduce to 20mg  ( 2 tabs) daily  and stay  100 tablet  6  . DISCONTD: predniSONE (DELTASONE) 10 MG tablet Take 1 tablet (10 mg total) by mouth daily.  30 tablet  1  . DISCONTD: predniSONE (DELTASONE) 10 MG tablet Take 30 mg by mouth daily.        Marland Kitchen DISCONTD: nystatin (MYCOSTATIN) cream Apply topically 3 (three) times daily.  30 g  1

## 2010-11-16 NOTE — Assessment & Plan Note (Signed)
Hypersensitivity pneumonitis in setting of bronchiectasis.(09/26/10) Neg FOB for pathogens or CA 7/12 Prednisone started 09/26/2010>>tapered to 10mg  7/24>pred increased 40mg x1wk/hold at 30mg  11/02/2010  ESR 72 (7/14)>>88 11/01/2010  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 Pt now moved out of moldy home. CXR improved 8/12 Plan Very slow pred taper.

## 2010-11-29 ENCOUNTER — Ambulatory Visit: Payer: Medicare Other | Admitting: Critical Care Medicine

## 2010-11-30 ENCOUNTER — Telehealth: Payer: Self-pay | Admitting: Critical Care Medicine

## 2010-11-30 NOTE — Telephone Encounter (Signed)
[facility] line busy x 3 wcb 

## 2010-11-30 NOTE — Telephone Encounter (Signed)
Spoke with pt. He is c/o feeling very nervous- relates to taking prednisone, currently taking 25 mg daily. Also c/o "head feels woozy" and nose bleeds every am since last hospital d/c. He states rt nostril stays full of dried blood. He has stopped using saline ns b/c states not helping at all. He states cough has improved, very little cough. He asked to have his ov with PW moved up, so I sched him for 12/06/10 at 11:15 am. Would like recs from PW in the meantime.

## 2010-11-30 NOTE — Telephone Encounter (Signed)
Go ahead and reduce prednisone to 20mg  /day for 3days then 15mg /d for 3days and then 10mg /d and hold Keep 9/18 ov

## 2010-11-30 NOTE — Telephone Encounter (Signed)
I spoke with pt wife and she is aware of PW recs. Nothing further was needed

## 2010-12-06 ENCOUNTER — Ambulatory Visit (INDEPENDENT_AMBULATORY_CARE_PROVIDER_SITE_OTHER): Payer: Medicare Other | Admitting: Critical Care Medicine

## 2010-12-06 ENCOUNTER — Encounter: Payer: Self-pay | Admitting: Critical Care Medicine

## 2010-12-06 ENCOUNTER — Other Ambulatory Visit (INDEPENDENT_AMBULATORY_CARE_PROVIDER_SITE_OTHER): Payer: Medicare Other

## 2010-12-06 VITALS — BP 130/72 | HR 99 | Temp 97.8°F | Ht 71.0 in | Wt 201.4 lb

## 2010-12-06 DIAGNOSIS — J449 Chronic obstructive pulmonary disease, unspecified: Secondary | ICD-10-CM

## 2010-12-06 DIAGNOSIS — Z23 Encounter for immunization: Secondary | ICD-10-CM

## 2010-12-06 DIAGNOSIS — J8410 Pulmonary fibrosis, unspecified: Secondary | ICD-10-CM

## 2010-12-06 LAB — BASIC METABOLIC PANEL
CO2: 30 mEq/L (ref 19–32)
Calcium: 9.7 mg/dL (ref 8.4–10.5)
Chloride: 103 mEq/L (ref 96–112)
Glucose, Bld: 124 mg/dL — ABNORMAL HIGH (ref 70–99)
Sodium: 140 mEq/L (ref 135–145)

## 2010-12-06 NOTE — Progress Notes (Signed)
Subjective:    Patient ID: Matthew Bridges, male    DOB: 1930/10/30, 77 y.o.   MRN: [id number]  HPI  77 y.o.   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  Then went to the beach and worse.  ABX Amoxicillin,  pcn shot,  Depo shot.  Pred x 8days 4/3/2/1.  Zpak. Despite this no better.  Mucinex also given .   Still coughing and hard to raise.  Hurts to cough.  Proair Rx  Pt has not responded to outpt rx>>Admitted   10/11/10  D yspnea is better.  Sent home on oxygen.  Notes tightness with coughing.  Mucus is still productive of clear and foamy.  Not as bad with dyspnea.No Nausea or emesis.  No edema in feet,   ? Mold exposure with windows.>taper prednisone Vickie Epley  11/01/10 Acute OV  Pt complains he does not feel any better with dyspnea, cough-dry. Cough keeps him up at night. Wears out easily. Runs out of air with minimal acitivity. They have moved out of their home due to mold. Now living in an apartment.   Pt was admitted 7/9-7/18 for new onset of hypoxic resp failure /copd exacerbation . 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 ESR was ~72.  He was discharged on 40mg  of prednisone daily . This has been tapered and he is now on 10 mg daily . He underwent cxr last ov which showed no change in bilateral aspdz. Underwent extensive labs that showed allergy profile  elevated IGE ~300. W/ neg profile.   Aspergillious abx  /fungal test have been neg to date.   Family is with pt today , they are very concerned that this is due to mold issues in the home. Time of illness was after cleaning mold on window. Then mold was found throughout the home. As above they have moved out of this home.   He remains very weak . Appetite is good. Feels is hard to get in enough air at times when he is moving around. Cough comes and goes but keeps  him awake a lot.    8/28 Pt with ongoing issues and was seen prior with NP on 8/14 Suspected BOOP , with elevated ESR initially at 72 along w/ xr changes  Labs Show allergy profile elevated IGE ~300. W/ neg profile.  Aspergillious abx /fungal test have been neg to date  Will treat with higher dose steroids to see if responsive, will taper to 20mg  and hold  May need higher dose for longer but will monitor response and check ESR-serially.  Plan:  ncrease Prednisone 40mg  daily for 5 days then 20mg  daily until seen back in office  I will call with lab and xray results.  GERD Diet.  Mucinex DM Twice daily As needed Cough/congestion  Hydromet 1 tsp every 8 hr As needed Cough -may make you sleepy  No eating 3 hrs prior to lying down.   Moved out of mold, out 11/21/10 out of the home.   Now coughing but is mucus clear.  Dyspnea is better.  No real chest pain.  No fever is noted.  Notes some edema in feet  12/06/2010 Feeling some better.  Jittery and agitated  On prednisone.   Pt denies any significant sore throat, nasal congestion or excess secretions, fever, chills, sweats, unintended  weight loss, pleurtic or exertional chest pain, orthopnea PND, or leg swelling Pt denies any increase in rescue therapy over baseline, denies waking up needing it or having any early am or nocturnal exacerbations of coughing/wheezing/or dyspnea. Pt also denies any obvious fluctuation in symptoms with  weather or environmental change or other alleviating or aggravating factors   Past Medical History  Diagnosis Date  . Bronchiectasis   . Nephrolithiasis   . Pneumonia   . Hyperlipidemia   . Insomnia   . Alcohol abuse   . Asthmatic bronchitis   . Pleurisy   . GERD (gastroesophageal reflux disease)   . Diverticulosis     Colonoscopy 2007/Dr. Leone Payor  . Hx of colonic polyp      Family History  Problem Relation Age of Onset  . Heart disease Mother   . Colon cancer Father   . Colon cancer Brother       History   Social History  . Marital Status: Married    Spouse Name: N/A    Number of Children: N/A  . Years of Education: N/A   Occupational History  . Retired    Social History Main Topics  . Smoking status: Former Smoker -- 1.5 packs/day for 10 years    Types: Cigarettes    Quit date: 03/20/1956  . Smokeless tobacco: Former Neurosurgeon    Types: Snuff, Chew  . Alcohol Use: Yes  . Drug Use: No  . Sexually Active: Not on file   Other Topics Concern  . Not on file   Social History Narrative  . No narrative on file     Allergies  Allergen Reactions  . Codeine      Outpatient Prescriptions Prior to Visit  Medication Sig Dispense Refill  . albuterol (PROAIR HFA) 108 (90 BASE) MCG/ACT inhaler Inhale 2 puffs into the lungs 4 (four) times daily as needed.       Marland Kitchen aspirin 81 MG tablet Take 81 mg by mouth daily.        . beclomethasone (QVAR) 80 MCG/ACT inhaler Inhale 2 puffs into the lungs 2 (two) times daily.  1 Inhaler  6  . guaifenesin (HUMIBID E) 400 MG TABS Take 400 mg by mouth 2 (two) times daily.       Marland Kitchen HYDROcodone-homatropine (HYDROMET) 5-1.5 MG/5ML syrup Take by mouth. 1 tsp every 8 hours as needed cough       . LORazepam (ATIVAN) 1 MG tablet Take 1 mg by mouth 2 (two) times daily.       Marland Kitchen omeprazole (PRILOSEC) 20 MG capsule Take 20 mg by mouth daily.        . predniSONE (DELTASONE) 10 MG tablet Reduce to 25mg  daily (2 1/2 tabs) for 10days then reduce to 20mg  ( 2 tabs) daily and stay  100 tablet  6      Review of Systems  Constitutional:   No  weight loss, night sweats,  Fevers, chills, + fatigue, lassitude. HEENT:   No headaches,  Difficulty swallowing,  Tooth/dental problems,  Sore throat,                No sneezing, itching, ear ache, nasal congestion, post nasal drip,   CV:  Notes  chest pain,  Orthopnea, PND, swelling in lower extremities, anasarca, dizziness, palpitations  GI  No heartburn, indigestion, abdominal pain, nausea, vomiting, diarrhea, change in  bowel habits, loss of appetite  Resp: less   shortness of breath with exertion or at rest.  No excess mucus,  no   productive cough,   No coughing up of blood.  No  change in color of mucus. No chest wall deformity  Skin: no rash or lesions.  GU: no dysuria, change in color of urine, no urgency or frequency.  No flank pain.  MS:  No joint pain or swelling.  No decreased range of motion.  No back pain.  Psych:  No change in mood or affect. No depression or anxiety.  No memory loss.     Objective:   Physical Exam  Filed Vitals:   12/06/10 1101  BP: 130/72  Pulse: 99  Temp: 97.8 F (36.6 C)  TempSrc: Oral  Height: 5\' 11"  (1.803 m)  Weight: 201 lb 6.4 oz (91.354 kg)  SpO2: 96%    Gen: Pleasant, well-nourished, in no distress,  normal affect  ENT: No lesions,  mouth clear,  oropharynx clear, no postnasal drip  Neck: No JVD, no TMG, no carotid bruits  Lungs: No use of accessory muscles, no dullness to percussion,  Improved BS  Cardiovascular: RRR, heart sounds normal, no murmur or gallops, no peripheral edema  Abdomen: soft and NT, no HSM,  BS normal  Musculoskeletal: No deformities, no cyanosis or clubbing  Neuro: alert, non focal  Skin: Warm, no lesions or rashes       amb on RA sats 93% RA  Assessment & Plan:   Postinflammatory pulmonary fibrosis Hypersensitivity pneumonitis in setting of bronchiectasis.(09/26/10) Neg FOB for pathogens or CA 7/12 Prednisone started 09/26/2010>>tapered to 10mg  7/24>pred increased 40mg x1wk/hold at 30mg  11/02/2010  ESR 72 (7/14)>>88 11/01/2010  Mold found in home RAST assay pos,  Aspergillous, IgE 330 All improved with removal from mold environment and pred pulse and taper, now off oxygen Plan D/c oxygen Taper pred to off  Flu vaccine  Stay on Qvar Note bmet normal today     Updated Medication List Outpatient Encounter Prescriptions as of 12/06/2010  Medication Sig Dispense Refill  . albuterol (PROAIR HFA) 108 (90 BASE)  MCG/ACT inhaler Inhale 2 puffs into the lungs 4 (four) times daily as needed.       Marland Kitchen aspirin 81 MG tablet Take 81 mg by mouth daily.        . beclomethasone (QVAR) 80 MCG/ACT inhaler Inhale 2 puffs into the lungs 2 (two) times daily.  1 Inhaler  6  . guaifenesin (HUMIBID E) 400 MG TABS Take 400 mg by mouth 2 (two) times daily.       Marland Kitchen HYDROcodone-homatropine (HYDROMET) 5-1.5 MG/5ML syrup Take by mouth. 1 tsp every 8 hours as needed cough       . LORazepam (ATIVAN) 1 MG tablet Take 1 mg by mouth 2 (two) times daily.       Marland Kitchen omeprazole (PRILOSEC) 20 MG capsule Take 20 mg by mouth daily.        . predniSONE (DELTASONE) 10 MG tablet Reduce to 25mg  daily (2 1/2 tabs) for 10days then reduce to 20mg  ( 2 tabs) daily and stay  100 tablet  6

## 2010-12-06 NOTE — Assessment & Plan Note (Signed)
Hypersensitivity pneumonitis in setting of bronchiectasis.(09/26/10) Neg FOB for pathogens or CA 7/12 Prednisone started 09/26/2010>>tapered to 10mg  7/24>pred increased 40mg x1wk/hold at 30mg  11/02/2010  ESR 72 (7/14)>>88 11/01/2010  Mold found in home RAST assay pos,  Aspergillous, IgE 330 All improved with removal from mold environment and pred pulse and taper, now off oxygen Plan D/c oxygen Taper pred to off  Flu vaccine  Stay on Qvar Note bmet normal today

## 2010-12-06 NOTE — Progress Notes (Signed)
Quick Note:  Call pt and tell her labs are ok, No change in medications ______ 

## 2010-12-07 ENCOUNTER — Telehealth: Payer: Self-pay | Admitting: *Deleted

## 2010-12-07 NOTE — Progress Notes (Signed)
Quick Note:  Called, spoke with pt and pt's wife. They were both informed labs are ok and no change in medications per PW. They both verbalized understanding of this but had further questions regarding pt's medications. Phone note created to address this. ______

## 2010-12-07 NOTE — Telephone Encounter (Signed)
Pt wife is aware

## 2010-12-07 NOTE — Telephone Encounter (Signed)
Yes, change to prn

## 2010-12-11 ENCOUNTER — Encounter: Payer: Self-pay | Admitting: Internal Medicine

## 2010-12-11 DIAGNOSIS — Z0000 Encounter for general adult medical examination without abnormal findings: Secondary | ICD-10-CM | POA: Insufficient documentation

## 2010-12-11 HISTORY — DX: Encounter for general adult medical examination without abnormal findings: Z00.00

## 2010-12-13 ENCOUNTER — Ambulatory Visit (INDEPENDENT_AMBULATORY_CARE_PROVIDER_SITE_OTHER): Payer: Medicare Other | Admitting: Internal Medicine

## 2010-12-13 ENCOUNTER — Encounter: Payer: Self-pay | Admitting: Internal Medicine

## 2010-12-13 VITALS — BP 128/60 | HR 86 | Temp 98.6°F | Ht 66.0 in | Wt 202.4 lb

## 2010-12-13 DIAGNOSIS — F419 Anxiety disorder, unspecified: Secondary | ICD-10-CM | POA: Insufficient documentation

## 2010-12-13 DIAGNOSIS — Z0000 Encounter for general adult medical examination without abnormal findings: Secondary | ICD-10-CM

## 2010-12-13 DIAGNOSIS — Z23 Encounter for immunization: Secondary | ICD-10-CM

## 2010-12-13 DIAGNOSIS — R040 Epistaxis: Secondary | ICD-10-CM

## 2010-12-13 DIAGNOSIS — F411 Generalized anxiety disorder: Secondary | ICD-10-CM

## 2010-12-13 MED ORDER — ALPRAZOLAM 0.5 MG PO TABS: 0.5000 mg | ORAL_TABLET | Freq: Two times a day (BID) | ORAL | Status: DC | PRN

## 2010-12-13 MED ORDER — PNEUMOCOCCAL VAC POLYVALENT 25 MCG/0.5ML IJ INJ: 0.5000 mL | INJECTION | Freq: Once | INTRAMUSCULAR | Status: DC

## 2010-12-13 NOTE — Progress Notes (Signed)
Subjective:    Patient ID: Matthew Bridges, male    DOB: 09-22-1930, 77 y.o.   MRN: [id number]  HPI  Here to establish - Here for wellness and f/u;  Overall doing ok;  Pt denies CP, worsening SOB, DOE, wheezing, orthopnea, PND, worsening LE edema, palpitations, dizziness or syncope.  Pt denies neurological change such as new Headache, facial or extremity weakness.  Pt denies polydipsia, polyuria, or low sugar symptoms. Pt states overall good compliance with treatment and medications, good tolerability, and trying to follow lower cholesterol diet.  Pt denies worsening depressive symptoms, suicidal ideation or panic. No fever, wt loss, night sweats, loss of appetite, or other constitutional symptoms.  Pt states good ability with ADL's, low fall risk, home safety reviewed and adequate, no significant changes in hearing or vision, and occasionally active with exercise.  Does have ongoing anxiety - lorazepam not working well.  Also with 1 mo ongoing right sided epistaxis without overt other sinus symtpoms, just wont quit Past Medical History  Diagnosis Date  . Bronchiectasis   . Nephrolithiasis   . Pneumonia   . Hyperlipidemia   . Insomnia   . Alcohol abuse   . Asthmatic bronchitis   . Pleurisy   . GERD (gastroesophageal reflux disease)   . Diverticulosis     Colonoscopy 2007/Dr. Leone Payor  . Hx of colonic polyp    Past Surgical History  Procedure Date  . Tonsillectomy     reports that he quit smoking about 54 years ago. His smoking use included Cigarettes. He has a 15 pack-year smoking history. He has quit using smokeless tobacco. His smokeless tobacco use included Snuff and Chew. He reports that he drinks alcohol. He reports that he does not use illicit drugs. family history includes Alcohol abuse in his father; Breast cancer in his sister; Colon cancer in his brother and father; Heart disease in his mother and other; and Lung cancer in his brother. Allergies  Allergen Reactions  . Codeine      Current Outpatient Prescriptions on File Prior to Visit  Medication Sig Dispense Refill  . albuterol (PROAIR HFA) 108 (90 BASE) MCG/ACT inhaler Inhale 2 puffs into the lungs 4 (four) times daily as needed.       Marland Kitchen aspirin 81 MG tablet Take 81 mg by mouth daily.        . beclomethasone (QVAR) 80 MCG/ACT inhaler Inhale 2 puffs into the lungs 2 (two) times daily.  1 Inhaler  6  . guaifenesin (HUMIBID E) 400 MG TABS Take 400 mg by mouth 2 (two) times daily.       Marland Kitchen omeprazole (PRILOSEC) 20 MG capsule Take 20 mg by mouth daily.        . predniSONE (DELTASONE) 10 MG tablet Reduce to 25mg  daily (2 1/2 tabs) for 10days then reduce to 20mg  ( 2 tabs) daily and stay  100 tablet  6  . HYDROcodone-homatropine (HYDROMET) 5-1.5 MG/5ML syrup Take by mouth. 1 tsp every 8 hours as needed cough       . predniSONE (DELTASONE) 10 MG tablet 4 tabs daily for 5 days , then 2 tabs daily and hold at this dose  75 tablet  0   No current facility-administered medications on file prior to visit.   Review of Systems Review of Systems  Constitutional: Negative for diaphoresis, activity change, appetite change and unexpected weight change.  HENT: Negative for hearing loss, ear pain, facial swelling, mouth sores and neck stiffness.   Eyes: Negative for pain,  redness and visual disturbance.  Respiratory: Negative for shortness of breath and wheezing.   Cardiovascular: Negative for chest pain and palpitations.  Gastrointestinal: Negative for diarrhea, blood in stool, abdominal distention and rectal pain.  Genitourinary: Negative for hematuria, flank pain and decreased urine volume.  Musculoskeletal: Negative for myalgias and joint swelling.  Skin: Negative for color change and wound.  Neurological: Negative for syncope and numbness.  Hematological: Negative for adenopathy.  Psychiatric/Behavioral: Negative for hallucinations, self-injury, decreased concentration and agitation.      Objective:   Physical Exam BP  128/60  Pulse 86  Temp(Src) 98.6 F (37 C) (Oral)  Ht 5\' 6"  (1.676 m)  Wt 202 lb 6.4 oz (91.808 kg)  BMI 32.67 kg/m2  SpO2 96% Physical Exam  VS noted, walks with cane, chronically ill appaering Constitutional: Pt is oriented to person, place, and time. Appears well-developed and well-nourished.  HENT:  Head: Normocephalic and atraumatic.  Right Ear: External ear normal.  Left Ear: External ear normal.  Nose: Nose normal.  Mouth/Throat: Oropharynx is clear and moist.  Eyes: Conjunctivae and EOM are normal. Pupils are equal, round, and reactive to light.  Neck: Normal range of motion. Neck supple. No JVD present. No tracheal deviation present.  Cardiovascular: Normal rate, regular rhythm, normal heart sounds and intact distal pulses.   Pulmonary/Chest: Effort normal and breath sounds normal.  Abdominal: Soft. Bowel sounds are normal. There is no tenderness.  Musculoskeletal: Normal range of motion. Exhibits no edema.  Lymphadenopathy:  Has no cervical adenopathy.  Neurological: Pt is alert and oriented to person, place, and time. Pt has normal reflexes. No cranial nerve deficit.  Skin: Skin is warm and dry. No rash noted.  Psychiatric:  Markedly irritable, 2+ nervous, dysphoric         Assessment & Plan:

## 2010-12-13 NOTE — Patient Instructions (Addendum)
You had the pneumonia shot today Please stop the lorazepam Take all new medications as prescribed - the generic for xanax as needed for stress Please go to LAB in the [facility] for the blood and/or urine tests to be done today Please call the phone number [phone] (the [employer] System) for results of testing in 2-3 days;  When calling, simply dial the number, and when prompted enter the MRN number above (the Medical Record Number) and the # key, then the message should start. You will be contacted regarding the referral for: ENT

## 2010-12-13 NOTE — Assessment & Plan Note (Signed)
Recurrent right nasal , mild for > 1 mo - for ent referral

## 2010-12-17 ENCOUNTER — Encounter: Payer: Self-pay | Admitting: Internal Medicine

## 2010-12-17 NOTE — Assessment & Plan Note (Signed)

## 2010-12-17 NOTE — Assessment & Plan Note (Signed)
Ok to try change lorazepam to xanax prn,  to f/u any worsening symptoms or concerns, declines counsling

## 2010-12-27 ENCOUNTER — Ambulatory Visit: Payer: Medicare Other | Admitting: Critical Care Medicine

## 2010-12-30 ENCOUNTER — Telehealth: Payer: Self-pay

## 2010-12-30 DIAGNOSIS — R42 Dizziness and giddiness: Secondary | ICD-10-CM

## 2010-12-30 NOTE — Telephone Encounter (Signed)
We can refer to Dr Modesto Charon - [place] [facility], but I would need to know the reason

## 2010-12-30 NOTE — Telephone Encounter (Signed)
Patients wife called to request a neurology referral for the patient. He has appointment Oct. 29th with his neurologist in [place], but the patient would like to see a neurologist in [place].

## 2011-01-09 NOTE — Telephone Encounter (Signed)
Called the patient left message to call back 

## 2011-01-10 ENCOUNTER — Encounter: Payer: Self-pay | Admitting: Neurology

## 2011-01-10 DIAGNOSIS — R42 Dizziness and giddiness: Secondary | ICD-10-CM

## 2011-01-10 HISTORY — DX: Dizziness and giddiness: R42

## 2011-01-10 NOTE — Telephone Encounter (Signed)
Called and spoke to the patients wife. She informed the reason he has been seen by Neurologist in [place] has been due to dizziness the patient has had everyday, but never been treated for.  They would like to have a neurologist here in [place]. Please advise.

## 2011-01-10 NOTE — Telephone Encounter (Signed)
Per pt request

## 2011-01-25 ENCOUNTER — Encounter: Payer: Self-pay | Admitting: Critical Care Medicine

## 2011-01-25 ENCOUNTER — Ambulatory Visit (INDEPENDENT_AMBULATORY_CARE_PROVIDER_SITE_OTHER): Payer: Medicare Other

## 2011-01-25 ENCOUNTER — Ambulatory Visit (INDEPENDENT_AMBULATORY_CARE_PROVIDER_SITE_OTHER)
Admission: RE | Admit: 2011-01-25 | Discharge: 2011-01-25 | Disposition: A | Discharge status: Home / Self Care | Payer: Medicare Other | Source: Ambulatory Visit | Attending: Critical Care Medicine | Admitting: Critical Care Medicine

## 2011-01-25 ENCOUNTER — Ambulatory Visit (INDEPENDENT_AMBULATORY_CARE_PROVIDER_SITE_OTHER): Payer: Medicare Other | Admitting: Critical Care Medicine

## 2011-01-25 VITALS — BP 140/78 | HR 81 | Temp 97.8°F | Ht 71.0 in | Wt 207.8 lb

## 2011-01-25 DIAGNOSIS — J8410 Pulmonary fibrosis, unspecified: Secondary | ICD-10-CM

## 2011-01-25 DIAGNOSIS — E538 Deficiency of other specified B group vitamins: Secondary | ICD-10-CM

## 2011-01-25 DIAGNOSIS — R079 Chest pain, unspecified: Secondary | ICD-10-CM

## 2011-01-25 HISTORY — DX: Chest pain, unspecified: R07.9

## 2011-01-25 MED ORDER — CYANOCOBALAMIN 1000 MCG/ML IJ SOLN
1000.0000 ug | Freq: Once | INTRAMUSCULAR | Status: AC
  Administered 2011-01-25: 1000 ug via INTRAMUSCULAR

## 2011-01-25 NOTE — Assessment & Plan Note (Signed)
Exertional chest pain rule out coronary disease patient has several risk factors for coronary artery disease Plan Referral cardiology

## 2011-01-25 NOTE — Progress Notes (Signed)
Subjective:    Patient ID: Matthew Bridges, male    DOB: February 20, 1931, 77 y.o.   MRN: [id number]  HPI  77 y.o.   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  Then went to the beach and worse.  ABX Amoxicillin,  pcn shot,  Depo shot.  Pred x 8days 4/3/2/1.  Zpak. Despite this no better.  Mucinex also given .   Still coughing and hard to raise.  Hurts to cough.  Proair Rx  Pt has not responded to outpt rx>>Admitted   10/11/10  D yspnea is better.  Sent home on oxygen.  Notes tightness with coughing.  Mucus is still productive of clear and foamy.  Not as bad with dyspnea.No Nausea or emesis.  No edema in feet,   ? Mold exposure with windows.>taper prednisone Vickie Epley  11/01/10 Acute OV  Pt complains he does not feel any better with dyspnea, cough-dry. Cough keeps him up at night. Wears out easily. Runs out of air with minimal acitivity. They have moved out of their home due to mold. Now living in an apartment.   Pt was admitted 7/9-7/18 for new onset of hypoxic resp failure /copd exacerbation . 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 ESR was ~72.  He was discharged on 40mg  of prednisone daily . This has been tapered and he is now on 10 mg daily . He underwent cxr last ov which showed no change in bilateral aspdz. Underwent extensive labs that showed allergy profile  elevated IGE ~300. W/ neg profile.   Aspergillious abx  /fungal test have been neg to date.   Family is with pt today , they are very concerned that this is due to mold issues in the home. Time of illness was after cleaning mold on window. Then mold was found throughout the home. As above they have moved out of this home.   He remains very weak . Appetite is good. Feels is hard to get in enough air at times when he is moving around. Cough comes and goes but keeps  him awake a lot.    8/28 Pt with ongoing issues and was seen prior with NP on 8/14 Suspected BOOP , with elevated ESR initially at 72 along w/ xr changes  Labs Show allergy profile elevated IGE ~300. W/ neg profile.  Aspergillious abx /fungal test have been neg to date  Will treat with higher dose steroids to see if responsive, will taper to 20mg  and hold  May need higher dose for longer but will monitor response and check ESR-serially.  Plan:  ncrease Prednisone 40mg  daily for 5 days then 20mg  daily until seen back in office  I will call with lab and xray results.  GERD Diet.  Mucinex DM Twice daily As needed Cough/congestion  Hydromet 1 tsp every 8 hr As needed Cough -may make you sleepy  No eating 3 hrs prior to lying down.   Moved out of mold, out 11/21/10 out of the home.   Now coughing but is mucus clear.  Dyspnea is better.  No real chest pain.  No fever is noted.  Notes some edema in feet  9/18 Feeling some better.  Jittery and agitated  On prednisone.   Pt denies any significant sore throat, nasal congestion or excess secretions, fever, chills, sweats, unintended  weight loss, pleurtic or exertional chest pain, orthopnea PND, or leg swelling Pt denies any increase in rescue therapy over baseline, denies waking up needing it or having any early am or nocturnal exacerbations of coughing/wheezing/or dyspnea. Pt also denies any obvious fluctuation in symptoms with  weather or environmental change or other alleviating or aggravating factors  01/25/2011 Noting a dry cough for two months.  Sl more dyspnea at times. No wheeze.  Notes some ant chest pain, and soreness since last visit.  Notes is exertional chest pain.  Notes a tightness with exertion.  Never at rest.  No radiation.  No change with a deep breath. Had to go back on oxygen with portable system and concentrator.     Past Medical History  Diagnosis Date  . Bronchiectasis   . Nephrolithiasis   . Pneumonia   .  Hyperlipidemia   . Insomnia   . Alcohol abuse   . Asthmatic bronchitis   . Pleurisy   . GERD (gastroesophageal reflux disease)   . Diverticulosis     Colonoscopy 2007/Dr. Leone Payor  . Hx of colonic polyp      Family History  Problem Relation Age of Onset  . Heart disease Mother   . Colon cancer Father   . Alcohol abuse Father   . Colon cancer Brother   . Lung cancer Brother   . Breast cancer Sister   . Heart disease Other     maternal side      History   Social History  . Marital Status: Married    Spouse Name: N/A    Number of Children: N/A  . Years of Education: N/A   Occupational History  . Retired    Social History Main Topics  . Smoking status: Former Smoker -- 1.5 packs/day for 10 years    Types: Cigarettes    Quit date: 03/20/1956  . Smokeless tobacco: Former Neurosurgeon    Types: Snuff, Chew  . Alcohol Use: Yes  . Drug Use: No  . Sexually Active: Not on file   Other Topics Concern  . Not on file   Social History Narrative  . No narrative on file     Allergies  Allergen Reactions  . Codeine      Outpatient Prescriptions Prior to Visit  Medication Sig Dispense Refill  . albuterol (PROAIR HFA) 108 (90 BASE) MCG/ACT inhaler Inhale 2 puffs into the lungs 4 (four) times daily as needed.       Marland Kitchen aspirin 81 MG tablet Take 81 mg by mouth daily.        . beclomethasone (QVAR) 80 MCG/ACT inhaler Inhale 2 puffs into the lungs 2 (two) times daily.  1 Inhaler  6  . guaifenesin (HUMIBID E) 400 MG TABS Take 400 mg by mouth 2 (two) times daily as needed.       Marland Kitchen omeprazole (PRILOSEC) 20 MG capsule Take 20 mg by mouth daily.        . predniSONE (DELTASONE) 10 MG tablet 4 tabs daily for 5 days , then 2 tabs daily and hold at this dose  75 tablet  0  . ALPRAZolam (XANAX) 0.5 MG tablet Take 1 tablet (0.5 mg total) by mouth 2 (two) times daily as needed for anxiety.  60 tablet  2  . HYDROcodone-homatropine (HYDROMET) 5-1.5 MG/5ML syrup Take by mouth. 1 tsp every 8 hours as  needed cough       . predniSONE (DELTASONE) 10 MG tablet Reduce to 25mg  daily (2 1/2  tabs) for 10days then reduce to 20mg  ( 2 tabs) daily and stay  100 tablet  6   Facility-Administered Medications Prior to Visit  Medication Dose Route Frequency Provider Last Rate Last Dose  . pneumococcal 23 valent vaccine (PNU-IMMUNE) injection 0.5 mL  0.5 mL Intramuscular Once Oliver Barre, MD          Review of Systems  Constitutional:   No  weight loss, night sweats,  Fevers, chills, + fatigue, lassitude. HEENT:   No headaches,  Difficulty swallowing,  Tooth/dental problems,  Sore throat,                No sneezing, itching, ear ache, nasal congestion, post nasal drip,   CV:  Notes  chest pain,  Orthopnea, PND, swelling in lower extremities, anasarca, dizziness, palpitations  GI  No heartburn, indigestion, abdominal pain, nausea, vomiting, diarrhea, change in bowel habits, loss of appetite  Resp: less   shortness of breath with exertion or at rest.  No excess mucus, no   productive cough,   No coughing up of blood.  No  change in color of mucus. No chest wall deformity  Skin: no rash or lesions.  GU: no dysuria, change in color of urine, no urgency or frequency.  No flank pain.  MS:  No joint pain or swelling.  No decreased range of motion.  No back pain.  Psych:  No change in mood or affect. No depression or anxiety.  No memory loss.     Objective:   Physical Exam  Filed Vitals:   01/25/11 0955  BP: 140/78  Pulse: 81  Temp: 97.8 F (36.6 C)  TempSrc: Oral  Height: 5\' 11"  (1.803 m)  Weight: 207 lb 12.8 oz (94.257 kg)  SpO2: 95%    Gen: Pleasant, well-nourished, in no distress,  normal affect  ENT: No lesions,  mouth clear,  oropharynx clear, no postnasal drip  Neck: No JVD, no TMG, no carotid bruits  Lungs: No use of accessory muscles, no dullness to percussion,  Improved BS  Cardiovascular: RRR, heart sounds normal, no murmur or gallops, no peripheral edema  Abdomen: soft  and NT, no HSM,  BS normal  Musculoskeletal: No deformities, no cyanosis or clubbing  Neuro: alert, non focal  Skin: Warm, no lesions or rashes       amb on RA sats 93% RA  CXR: 01/25/11 IMPRESSION:  Chronic scarring/fibrosis in the lung bases.   Assessment & Plan:   Postinflammatory pulmonary fibrosis Hypersensitivity pneumonitis with bronchiectasis recently tapered off prednisone,  exertional chest pain evaluate for possible coronary disease Plan Note chest x-ray today shows chronic scarring without acute pneumonitis  I am concerned about the patient's chest pain syndrome Refer to cardiology for evaluation  no change in pulmonary medications at this time  Chest pain on exertion Exertional chest pain rule out coronary disease patient has several risk factors for coronary artery disease Plan Referral cardiology     Updated Medication List Outpatient Encounter Prescriptions as of 01/25/2011  Medication Sig Dispense Refill  . albuterol (PROAIR HFA) 108 (90 BASE) MCG/ACT inhaler Inhale 2 puffs into the lungs 4 (four) times daily as needed.       Marland Kitchen aspirin 81 MG tablet Take 81 mg by mouth daily.        . beclomethasone (QVAR) 80 MCG/ACT inhaler Inhale 2 puffs into the lungs 2 (two) times daily.  1 Inhaler  6  . guaifenesin (HUMIBID E) 400 MG TABS Take 400 mg  by mouth 2 (two) times daily as needed.       Marland Kitchen LORazepam (ATIVAN) 1 MG tablet Take 1 mg by mouth 2 (two) times daily.        Marland Kitchen omeprazole (PRILOSEC) 20 MG capsule Take 20 mg by mouth daily.        Marland Kitchen DISCONTD: predniSONE (DELTASONE) 10 MG tablet 4 tabs daily for 5 days , then 2 tabs daily and hold at this dose  75 tablet  0  . DISCONTD: predniSONE (DELTASONE) 10 MG tablet 4 tabs daily for 5 days , then 2 tabs daily and hold at this dose       . DISCONTD: ALPRAZolam (XANAX) 0.5 MG tablet Take 1 tablet (0.5 mg total) by mouth 2 (two) times daily as needed for anxiety.  60 tablet  2  . DISCONTD: HYDROcodone-homatropine  (HYDROMET) 5-1.5 MG/5ML syrup Take by mouth. 1 tsp every 8 hours as needed cough       . DISCONTD: predniSONE (DELTASONE) 10 MG tablet Reduce to 25mg  daily (2 1/2 tabs) for 10days then reduce to 20mg  ( 2 tabs) daily and stay  100 tablet  6   Facility-Administered Encounter Medications as of 01/25/2011  Medication Dose Route Frequency Provider Last Rate Last Dose  . DISCONTD: pneumococcal 23 valent vaccine (PNU-IMMUNE) injection 0.5 mL  0.5 mL Intramuscular Once Oliver Barre, MD

## 2011-01-25 NOTE — Assessment & Plan Note (Signed)
Hypersensitivity pneumonitis with bronchiectasis recently tapered off prednisone,  exertional chest pain evaluate for possible coronary disease Plan Note chest x-ray today shows chronic scarring without acute pneumonitis  I am concerned about the patient's chest pain syndrome Refer to cardiology for evaluation  no change in pulmonary medications at this time

## 2011-01-25 NOTE — Patient Instructions (Signed)
A referral to Cardiology will be made  An Overnight sleep oxygen test will be repeated No change in medications A chest xray will be made today Return 2 months

## 2011-01-26 ENCOUNTER — Ambulatory Visit (INDEPENDENT_AMBULATORY_CARE_PROVIDER_SITE_OTHER): Payer: Medicare Other | Admitting: Cardiovascular Disease

## 2011-01-26 ENCOUNTER — Encounter: Payer: Self-pay | Admitting: Cardiovascular Disease

## 2011-01-26 ENCOUNTER — Ambulatory Visit: Payer: Medicare Other | Admitting: Neurology

## 2011-01-26 DIAGNOSIS — R079 Chest pain, unspecified: Secondary | ICD-10-CM

## 2011-01-26 DIAGNOSIS — E785 Hyperlipidemia, unspecified: Secondary | ICD-10-CM

## 2011-01-26 DIAGNOSIS — J8410 Pulmonary fibrosis, unspecified: Secondary | ICD-10-CM

## 2011-01-26 DIAGNOSIS — F1011 Alcohol abuse, in remission: Secondary | ICD-10-CM

## 2011-01-26 NOTE — Progress Notes (Signed)
77 yo referred by Dr Delford Field for SSCP.  Complicated pulmonary course last year with bronchiectasis and lots of infectons.  One requiring 9 day hospital stay and D/C on oxygen.  Was on steroids for a long time until earlier this year.  Has had SSCP last few months.  Tends to worsen with bronchitis and not necessarily exertional.  Persistant.  Dyspnea somewhat improved and no longer needs oxygen.  Has known vascular disese with carotid stenosis.  Last duplex 2 years ago in [place] and needs f/U.  Previous alcoholic that stopped in May but now needs valium.  Has worsening head bobbing tremor. Pulmonary status would make stress testing difficult.  No pleuritic component to pain.  NO cough or sputum  ROS: Denies fever, malais, weight loss, blurry vision, decreased visual acuity, cough, sputum, SOB, hemoptysis, pleuritic pain, palpitaitons, heartburn, abdominal pain, melena, lower extremity edema, claudication, or rash.  All other systems reviewed and negative   General: Affect appropriate Healthy:  appears stated age HEENT: normal Neck supple with no adenopathy JVP normal no bruits no thyromegaly Lungs clear with no wheezing and good diaphragmatic motion Heart:  S1/S2 no murmur,rub, gallop or click PMI normal Abdomen: benighn, BS positve, no tenderness, no AAA no bruit.  No HSM or HJR Distal pulses intact with no bruits No edema Neuro non-focal Skin warm and dry No muscular weakness  Medications Current Outpatient Prescriptions  Medication Sig Dispense Refill  . albuterol (PROAIR HFA) 108 (90 BASE) MCG/ACT inhaler Inhale 2 puffs into the lungs 4 (four) times daily as needed.       Marland Kitchen aspirin 81 MG tablet Take 81 mg by mouth daily.        . beclomethasone (QVAR) 80 MCG/ACT inhaler Inhale 2 puffs into the lungs 2 (two) times daily.  1 Inhaler  6  . guaifenesin (HUMIBID E) 400 MG TABS Take 400 mg by mouth 2 (two) times daily as needed.       Marland Kitchen LORazepam (ATIVAN) 1 MG tablet Take 1 mg by mouth 2  (two) times daily.        Marland Kitchen omeprazole (PRILOSEC) 20 MG capsule Take 20 mg by mouth daily.          Allergies Codeine  Family History: Family History  Problem Relation Age of Onset  . Heart disease Mother   . Colon cancer Father   . Alcohol abuse Father   . Colon cancer Brother   . Lung cancer Brother   . Breast cancer Sister   . Heart disease Other     maternal side     Social History: History   Social History  . Marital Status: Married    Spouse Name: N/A    Number of Children: N/A  . Years of Education: N/A   Occupational History  . Retired    Social History Main Topics  . Smoking status: Former Smoker -- 1.5 packs/day for 10 years    Types: Cigarettes    Quit date: 03/20/1956  . Smokeless tobacco: Former Neurosurgeon    Types: Snuff, Chew  . Alcohol Use: Yes  . Drug Use: No  . Sexually Active: Not on file   Other Topics Concern  . Not on file   Social History Narrative  . No narrative on file    Electrocardiogram:  NSR 39 possible old IMI  Assessment and Plan

## 2011-01-26 NOTE — Assessment & Plan Note (Signed)
Abnormal ECG, known vascular disease with bruit.  SSCP  Needs left and right cath.  Risks discussed Willing to proceed.  Next Monday

## 2011-01-26 NOTE — Progress Notes (Signed)
Quick Note:  Notify the patient that the Xray is stable and no pneumonia, no active inflammation in the lungs. No need for prednisone No change in medications are recommended. ______

## 2011-01-26 NOTE — Assessment & Plan Note (Signed)
Cholesterol is at goal.  Continue current dose of statin and diet Rx.  No myalgias or side effects.  F/U  LFT's in 6 months. No results found for this basename: LDLCALC             

## 2011-01-26 NOTE — Assessment & Plan Note (Signed)
F/U Dr Delford Field.  Chest pain may involve pleuropericarditis.  If cath does not show CAD will consider ESR

## 2011-01-26 NOTE — Assessment & Plan Note (Signed)
Continue lorazapam.  Father was an alcoholic.  Head bobbing tremor worse off it

## 2011-01-26 NOTE — Patient Instructions (Signed)
Your physician recommends that you schedule a follow-up appointment in: 4 WEEKS  Your physician has requested that you have a cardiac catheterization. Cardiac catheterization is used to diagnose and/or treat various heart conditions. Doctors may recommend this procedure for a number of different reasons. The most common reason is to evaluate chest pain. Chest pain can be a symptom of coronary artery disease (CAD), and cardiac catheterization can show whether plaque is narrowing or blocking your heart's arteries. This procedure is also used to evaluate the valves, as well as measure the blood flow and oxygen levels in different parts of your heart. For further information please visit [email or web address] Please follow instruction sheet, as given.   Your physician recommends that you return for lab work in: today  Your physician has requested that you have a carotid duplex. This test is an ultrasound of the carotid arteries in your neck. It looks at blood flow through these arteries that supply the brain with blood. Allow one hour for this exam. There are no restrictions or special instructions.

## 2011-01-27 ENCOUNTER — Telehealth: Payer: Self-pay

## 2011-01-27 ENCOUNTER — Other Ambulatory Visit: Payer: Self-pay | Admitting: Cardiovascular Disease

## 2011-01-27 LAB — PROTIME-INR: Prothrombin Time: 10.8 s (ref 10.2–12.4)

## 2011-01-27 LAB — CBC WITH DIFFERENTIAL/PLATELET
Basophils Relative: 0.5 % (ref 0.0–3.0)
Eosinophils Relative: 15 % — ABNORMAL HIGH (ref 0.0–5.0)
HCT: 42.2 % (ref 39.0–52.0)
Lymphs Abs: 3.6 10*3/uL (ref 0.7–4.0)
MCV: 97.9 fl (ref 78.0–100.0)
Monocytes Absolute: 0.9 10*3/uL (ref 0.1–1.0)
Platelets: 195 10*3/uL (ref 150.0–400.0)
WBC: 14.7 10*3/uL — ABNORMAL HIGH (ref 4.5–10.5)

## 2011-01-27 LAB — BASIC METABOLIC PANEL
BUN: 16 mg/dL (ref 6–23)
CO2: 28 mEq/L (ref 19–32)
Chloride: 104 mEq/L (ref 96–112)
Potassium: 4 mEq/L (ref 3.5–5.1)

## 2011-01-27 NOTE — Telephone Encounter (Signed)
Spoke with pt, his cath was rescheduled to wed 02-01-11 @ 7:30am with dr Clifton James. Deliah Goody

## 2011-01-27 NOTE — Telephone Encounter (Signed)
Wife calling wanting to know if Matthew Bridges cath can be rescheduled to Wed or Thursday of next week.  She doesn't drive and they haven't been able to get a ride for Monday but his son can take him on Wed or Thurs.  Please call her back at [phone].

## 2011-01-30 ENCOUNTER — Encounter (HOSPITAL_BASED_OUTPATIENT_CLINIC_OR_DEPARTMENT_OTHER): Payer: Self-pay

## 2011-01-30 ENCOUNTER — Inpatient Hospital Stay (HOSPITAL_BASED_OUTPATIENT_CLINIC_OR_DEPARTMENT_OTHER): Admit: 2011-01-30 | Payer: Self-pay | Admitting: Cardiovascular Disease

## 2011-01-30 SURGERY — Surgical Case
Anesthesia: *Unknown

## 2011-01-30 SURGERY — JV LEFT HEART CATHETERIZATION WITH CORONARY ANGIOGRAM
Anesthesia: Moderate Sedation | Laterality: Left

## 2011-01-31 ENCOUNTER — Telehealth: Payer: Self-pay | Admitting: Critical Care Medicine

## 2011-01-31 DIAGNOSIS — J8410 Pulmonary fibrosis, unspecified: Secondary | ICD-10-CM

## 2011-01-31 NOTE — Telephone Encounter (Signed)
Order to dme to dc 02

## 2011-01-31 NOTE — Telephone Encounter (Signed)
ONO on RA normal Pt aware, will d/c oxygen

## 2011-02-02 ENCOUNTER — Other Ambulatory Visit (HOSPITAL_BASED_OUTPATIENT_CLINIC_OR_DEPARTMENT_OTHER): Payer: Self-pay | Admitting: Cardiovascular Disease

## 2011-02-02 ENCOUNTER — Encounter (HOSPITAL_BASED_OUTPATIENT_CLINIC_OR_DEPARTMENT_OTHER): Payer: Self-pay

## 2011-02-02 ENCOUNTER — Encounter (HOSPITAL_BASED_OUTPATIENT_CLINIC_OR_DEPARTMENT_OTHER): Payer: Self-pay | Admitting: *Deleted

## 2011-02-02 ENCOUNTER — Inpatient Hospital Stay (HOSPITAL_BASED_OUTPATIENT_CLINIC_OR_DEPARTMENT_OTHER)
Admission: RE | Admit: 2011-02-02 | Discharge: 2011-02-02 | Disposition: A | Discharge status: Home / Self Care | Payer: Medicare Other | Source: Ambulatory Visit | Attending: Cardiovascular Disease | Admitting: Cardiovascular Disease

## 2011-02-02 ENCOUNTER — Encounter (HOSPITAL_BASED_OUTPATIENT_CLINIC_OR_DEPARTMENT_OTHER)
Admission: RE | Disposition: A | Discharge status: Home / Self Care | Payer: Self-pay | Source: Ambulatory Visit | Attending: Cardiovascular Disease

## 2011-02-02 DIAGNOSIS — I2510 Atherosclerotic heart disease of native coronary artery without angina pectoris: Secondary | ICD-10-CM | POA: Insufficient documentation

## 2011-02-02 DIAGNOSIS — R0602 Shortness of breath: Secondary | ICD-10-CM | POA: Insufficient documentation

## 2011-02-02 LAB — POCT I-STAT 3, ART BLOOD GAS (G3+)
O2 Saturation: 90 %
pCO2 arterial: 41.6 mmHg (ref 35.0–45.0)
pH, Arterial: 7.387 (ref 7.350–7.450)
pO2, Arterial: 61 mmHg — ABNORMAL LOW (ref 80.0–100.0)

## 2011-02-02 LAB — POCT I-STAT 3, VENOUS BLOOD GAS (G3P V)
Acid-base deficit: 1 mmol/L (ref 0.0–2.0)
Bicarbonate: 25.3 mEq/L — ABNORMAL HIGH (ref 20.0–24.0)
O2 Saturation: 65 %
TCO2: 27 mmol/L (ref 0–100)
pCO2, Ven: 46.8 mmHg (ref 45.0–50.0)
pO2, Ven: 36 mmHg (ref 30.0–45.0)

## 2011-02-02 SURGERY — JV LEFT HEART CATHETERIZATION WITH CORONARY ANGIOGRAM
Anesthesia: Moderate Sedation

## 2011-02-02 MED ORDER — ISOSORBIDE MONONITRATE ER 60 MG PO TB24: 60.0000 mg | ORAL_TABLET | Freq: Every day | ORAL | Status: DC

## 2011-02-02 MED ORDER — DIPHENHYDRAMINE HCL 50 MG/ML IJ SOLN: 25.0000 mg | INTRAMUSCULAR | Status: DC

## 2011-02-02 MED ORDER — ACETAMINOPHEN 325 MG PO TABS: 650.0000 mg | ORAL_TABLET | ORAL | Status: DC | PRN

## 2011-02-02 MED ORDER — SODIUM CHLORIDE 0.9 % IV SOLN: 1.0000 mL/kg/h | INTRAVENOUS | Status: DC

## 2011-02-02 MED ORDER — ONDANSETRON HCL 4 MG/2ML IJ SOLN: 4.0000 mg | Freq: Four times a day (QID) | INTRAMUSCULAR | Status: DC | PRN

## 2011-02-02 MED ORDER — DIAZEPAM 5 MG PO TABS: 5.0000 mg | ORAL_TABLET | ORAL | Status: DC

## 2011-02-02 MED ORDER — SODIUM CHLORIDE 0.9 % IV SOLN: INTRAVENOUS | Status: DC

## 2011-02-02 NOTE — Op Note (Signed)
Cardiac Cath Note  Matthew Bridges [id number] [birth year]  Procedure: Right and left Heart Cardiac Catheterization Note Indications: Chest pain, severe shortness of breath  Procedure Details Consent: Obtained Time Out: Verified patient identification, verified procedure, site/side was marked, verified correct patient position, special equipment/implants available, Radiology Safety Procedures followed,  medications/allergies/relevent history reviewed, required imaging and test results available.  Time out was performed.  The right femoral artery and right femoral vein were  easily canulated using a modified Seldinger technique.  The right femoral area was numbed up using 1% lidocaine.  Hemodynamics:  Right atrial pressure is 7/6 / 5 Right ventricular pressure: 25/8-mean of 8 Pulmonary capillary wedge pressure is 7 Pulmonary artery pressure: 26/9-mean of 16 Left ventricular pressures 123/8 Matthew Bridges pressure is 119/56  Aortic saturation: 90% Pulmonary artery saturation: 65% Hemoglobin 14.3  Fick cardiac output equals 5.5 L a minute Thermodilution cardiac output is 4.9 L per minute  Coronary angiograms: Standard catheters were used including a Judkins left 4 and a 3-D RC (4 [place]).  The catheters were exchanged out over a guidewire.   Coronary angiography:  The left main is fairly smooth and normal.  Left anterior descending artery:  The LAD has mild to moderate calcifications. There is a proximal 70% stenosis. There is a mid 80-90% stenosis and a terminal 80-90% stenosis right before the bifurcation. The first diagonal artery is fairly small branch that originates after the proximal stenosis. There is no significant irregularities.  Left circumflex artery: The left circumflex artery provides flow to a large piece marginal branch. The left complex artery is subtotally occluded just after giving off the first acute marginal branch. This continuation branch fills very slowly.  The first  acute marginal artery has minor luminal regularities.  Right coronary artery: The right coronary artery is severely and diffusely diseased. There are 80-90% stenosis in the proximal vessel, mid vessel, and distally. There is a tight 90% stenosis just prior to the bifurcation. There is a tight 90% stenosis in the mid/distal posterior descending artery. The posterior lateral branch has mild diffuse irregular disease.  Left ventriculogram: The left ventriculogram was performed in the 30 RAO position. It reveals overall normal left ventricular systolic function with an ejection fraction of 60%.  Complications.: None  Conclusions: Severe diffuse three-vessel coronary artery disease. Patient has numerous other issues including mild dementia and coronary fibrosis. It is not clear if he is a candidate for CABG. I'll have him return to see Dr. Eden Emms For further discussion of this.  His heart rate is already fairly soft slow. I do not think that he needs beta blockers because he of his severe lung disease and because of his bradycardia. We'll add isosorbide 60 mg a day.       Complications: none Patient did tolerate procedure well.   Matthew Bridges, Matthew Bridges., MD, [facility] 02/02/2011, 8:43 AM

## 2011-02-02 NOTE — OR Nursing (Signed)
Pt ambulated in hall, voided without difficulty, site intact with clean dry dressing, discharge instructions reviewed and signed, pt stated understanding with no questions, transported to son's car via wheelchair.

## 2011-02-02 NOTE — H&P (View-Only) (Signed)
77 yo referred by Dr Wright for SSCP.  Complicated pulmonary course last year with bronchiectasis and lots of infectons.  One requiring 9 day hospital stay and D/C on oxygen.  Was on steroids for a long time until earlier this year.  Has had SSCP last few months.  Tends to worsen with bronchitis and not necessarily exertional.  Persistant.  Dyspnea somewhat improved and no longer needs oxygen.  Has known vascular disese with carotid stenosis.  Last duplex 2 years ago in [place] and needs f/U.  Previous alcoholic that stopped in May but now needs valium.  Has worsening head bobbing tremor. Pulmonary status would make stress testing difficult.  No pleuritic component to pain.  NO cough or sputum  ROS: Denies fever, malais, weight loss, blurry vision, decreased visual acuity, cough, sputum, SOB, hemoptysis, pleuritic pain, palpitaitons, heartburn, abdominal pain, melena, lower extremity edema, claudication, or rash.  All other systems reviewed and negative   General: Affect appropriate Healthy:  appears stated age HEENT: normal Neck supple with no adenopathy JVP normal no bruits no thyromegaly Lungs clear with no wheezing and good diaphragmatic motion Heart:  S1/S2 no murmur,rub, gallop or click PMI normal Abdomen: benighn, BS positve, no tenderness, no AAA no bruit.  No HSM or HJR Distal pulses intact with no bruits No edema Neuro non-focal Skin warm and dry No muscular weakness  Medications Current Outpatient Prescriptions  Medication Sig Dispense Refill  . albuterol (PROAIR HFA) 108 (90 BASE) MCG/ACT inhaler Inhale 2 puffs into the lungs 4 (four) times daily as needed.       . aspirin 81 MG tablet Take 81 mg by mouth daily.        . beclomethasone (QVAR) 80 MCG/ACT inhaler Inhale 2 puffs into the lungs 2 (two) times daily.  1 Inhaler  6  . guaifenesin (HUMIBID E) 400 MG TABS Take 400 mg by mouth 2 (two) times daily as needed.       . LORazepam (ATIVAN) 1 MG tablet Take 1 mg by mouth 2  (two) times daily.        . omeprazole (PRILOSEC) 20 MG capsule Take 20 mg by mouth daily.          Allergies Codeine  Family History: Family History  Problem Relation Age of Onset  . Heart disease Mother   . Colon cancer Father   . Alcohol abuse Father   . Colon cancer Brother   . Lung cancer Brother   . Breast cancer Sister   . Heart disease Other     maternal side     Social History: History   Social History  . Marital Status: Married    Spouse Name: N/A    Number of Children: N/A  . Years of Education: N/A   Occupational History  . Retired    Social History Main Topics  . Smoking status: Former Smoker -- 1.5 packs/day for 10 years    Types: Cigarettes    Quit date: 03/20/1956  . Smokeless tobacco: Former User    Types: Snuff, Chew  . Alcohol Use: Yes  . Drug Use: No  . Sexually Active: Not on file   Other Topics Concern  . Not on file   Social History Narrative  . No narrative on file    Electrocardiogram:  NSR 71 possible old IMI  Assessment and Plan   

## 2011-02-02 NOTE — OR Nursing (Signed)
Tegaderm dressing applied, bedrest begins at 0905

## 2011-02-02 NOTE — Brief Op Note (Signed)
Cardiac cath report completed.  See OP / procedure.  Severe 3 vessel disease. Normal LV function. Will discuss with Dr. Eden Emms.  He will likely need CABG.  He has some degree of dementia.

## 2011-02-02 NOTE — Interval H&P Note (Signed)
Date of Initial H&P: 01/26/11  History reviewed, patient examined, no change in status, stable for surgery.

## 2011-02-08 ENCOUNTER — Encounter: Payer: Self-pay | Admitting: Critical Care Medicine

## 2011-02-24 ENCOUNTER — Ambulatory Visit (INDEPENDENT_AMBULATORY_CARE_PROVIDER_SITE_OTHER): Payer: Medicare Other

## 2011-02-24 DIAGNOSIS — E538 Deficiency of other specified B group vitamins: Secondary | ICD-10-CM

## 2011-02-24 MED ORDER — CYANOCOBALAMIN 1000 MCG/ML IJ SOLN
1000.0000 ug | Freq: Once | INTRAMUSCULAR | Status: AC
  Administered 2011-02-24: 1000 ug via INTRAMUSCULAR

## 2011-02-27 ENCOUNTER — Encounter (INDEPENDENT_AMBULATORY_CARE_PROVIDER_SITE_OTHER): Payer: Medicare Other | Admitting: *Deleted

## 2011-02-27 ENCOUNTER — Ambulatory Visit (INDEPENDENT_AMBULATORY_CARE_PROVIDER_SITE_OTHER): Payer: Medicare Other | Admitting: Cardiovascular Disease

## 2011-02-27 ENCOUNTER — Encounter: Payer: Self-pay | Admitting: Cardiovascular Disease

## 2011-02-27 DIAGNOSIS — F1011 Alcohol abuse, in remission: Secondary | ICD-10-CM

## 2011-02-27 DIAGNOSIS — R079 Chest pain, unspecified: Secondary | ICD-10-CM

## 2011-02-27 DIAGNOSIS — E785 Hyperlipidemia, unspecified: Secondary | ICD-10-CM

## 2011-02-27 DIAGNOSIS — I6529 Occlusion and stenosis of unspecified carotid artery: Secondary | ICD-10-CM

## 2011-02-27 DIAGNOSIS — J8410 Pulmonary fibrosis, unspecified: Secondary | ICD-10-CM

## 2011-02-27 DIAGNOSIS — R0600 Dyspnea, unspecified: Secondary | ICD-10-CM

## 2011-02-27 DIAGNOSIS — I2510 Atherosclerotic heart disease of native coronary artery without angina pectoris: Secondary | ICD-10-CM

## 2011-02-27 NOTE — Patient Instructions (Signed)
Your physician recommends that you schedule a follow-up appointment in: 3 MONTHS WITH DR [facility] Your physician recommends that you continue on your current medications as directed. Please refer to the Current Medication list given to you today. Your physician has recommended that you have a pulmonary function test. Pulmonary Function Tests are a group of tests that measure how well air moves in and out of your lungs.DX DYSPNEA You have been referred to  DR WRIGHT   TO SEE IF CAN TOLERATE  CABG  AND DR Laneta Simmers  NEEDS POSSIBLE CABG

## 2011-02-27 NOTE — Assessment & Plan Note (Signed)
Cholesterol is at goal.  Continue current dose of statin and diet Rx.  No myalgias or side effects.  F/U  LFT's in 6 months. No results found for this basename: LDLCALC             

## 2011-02-27 NOTE — Assessment & Plan Note (Signed)
PFT;s F/U Matthew Bridges  See if he is a candidate for CABG  Dyspnea has improved and not on oxygen at home

## 2011-02-27 NOTE — Assessment & Plan Note (Signed)
3VD continue ASA and nitrates no beta blocker due to soft BP and COPD.  CABG if possible if not start Plavix

## 2011-02-27 NOTE — Progress Notes (Signed)
77 yo referred by Dr Delford Field for SSCP. Complicated pulmonary course last year with bronchiectasis and lots of infectons. One requiring 9 day hospital stay and D/C on oxygen. Was on steroids for a long time until earlier this year. Has had SSCP last few months. Tends to worsen with bronchitis and not necessarily exertional. Persistant. Dyspnea somewhat improved and no longer needs oxygen. Has known vascular disese with carotid stenosis. Last duplex 2 years ago in [place] and needs f/U. Previous alcoholic that stopped in May but now needs valium. Has worsening head bobbing tremor. Pulmonary status would make stress testing difficult. No pleuritic component to pain. NO cough or sputum  Cath 11/15 reviewed  Severe 3 VD LM ok, LAD 70% eccentric proximal disease, 80% distal Circ subtotal AV groove RCA 3 or more 80% lesions in RCA and PDA EF 50-55%  Carotid:  From today reviewed  60-79% LICA stenosis  Still with dyspnea. No fever cough or sputum.    Discussed options at length.  Will do PFT;s and ask Dr Delford Field to comment on whether his lungs ar "strong" enough for CABG. Then refer to CVTS Vessesl are not ideal for CABG.  Would need sequential to PDA and PLA and lima to LAD  Would not bypass AV groove  I believe some of his dyspnea is anginal equivalent.  If he is not deemed a CABG candidate will start on Plavix  Called son Orvilla Fus and let him know dad's prognosis [phone]  ROS: Denies fever, malais, weight loss, blurry vision, decreased visual acuity, cough, sputum,  hemoptysis, pleuritic pain, palpitaitons, heartburn, abdominal pain, melena, lower extremity edema, claudication, or rash.  All other systems reviewed and negative  General: Affect appropriate Healthy:  appears stated age HEENT: normal Neck supple with no adenopathy JVP normal left bruits no thyromegaly Lungs clear with no wheezing and good diaphragmatic motion Heart:  S1/S2 no murmur,rub, gallop or click PMI normal Abdomen: benighn,  BS positve, no tenderness, no AAA no bruit.  No HSM or HJR Distal pulses intact with no bruits No edema Neuro non-focal Skin warm and dry No muscular weakness   Current Outpatient Prescriptions  Medication Sig Dispense Refill  . albuterol (PROAIR HFA) 108 (90 BASE) MCG/ACT inhaler Inhale 2 puffs into the lungs 4 (four) times daily as needed.       Marland Kitchen aspirin 81 MG tablet Take 81 mg by mouth daily.        . beclomethasone (QVAR) 80 MCG/ACT inhaler Inhale 2 puffs into the lungs 2 (two) times daily.  1 Inhaler  6  . guaifenesin (HUMIBID E) 400 MG TABS Take 400 mg by mouth 2 (two) times daily as needed.       . isosorbide mononitrate (IMDUR) 60 MG 24 hr tablet Take 1 tablet (60 mg total) by mouth daily.  30 tablet  12  . LORazepam (ATIVAN) 1 MG tablet Take 1 mg by mouth 2 (two) times daily.        . nitroGLYCERIN (NITROSTAT) 0.4 MG SL tablet Place 0.4 mg under the tongue every 5 (five) minutes as needed.        Marland Kitchen omeprazole (PRILOSEC) 20 MG capsule Take 20 mg by mouth daily.          Allergies  Codeine  Electrocardiogram:  Assessment and Plan

## 2011-03-20 ENCOUNTER — Ambulatory Visit (INDEPENDENT_AMBULATORY_CARE_PROVIDER_SITE_OTHER): Payer: Medicare Other | Admitting: Internal Medicine

## 2011-03-20 ENCOUNTER — Other Ambulatory Visit: Payer: Self-pay | Admitting: *Deleted

## 2011-03-20 ENCOUNTER — Ambulatory Visit (INDEPENDENT_AMBULATORY_CARE_PROVIDER_SITE_OTHER): Payer: Medicare Other | Admitting: *Deleted

## 2011-03-20 DIAGNOSIS — E538 Deficiency of other specified B group vitamins: Secondary | ICD-10-CM

## 2011-03-20 DIAGNOSIS — J449 Chronic obstructive pulmonary disease, unspecified: Secondary | ICD-10-CM

## 2011-03-20 LAB — PULMONARY FUNCTION TEST

## 2011-03-20 MED ORDER — CYANOCOBALAMIN 1000 MCG/ML IJ SOLN
1000.0000 ug | Freq: Once | INTRAMUSCULAR | Status: AC
  Administered 2011-03-20: 1000 ug via INTRAMUSCULAR

## 2011-03-20 MED ORDER — OMEPRAZOLE 20 MG PO CPDR: 20.0000 mg | DELAYED_RELEASE_CAPSULE | Freq: Every day | ORAL | Status: DC

## 2011-03-20 NOTE — Progress Notes (Signed)
PFT done today. 

## 2011-03-24 ENCOUNTER — Encounter: Payer: Self-pay | Admitting: Cardiovascular Disease

## 2011-03-28 ENCOUNTER — Telehealth: Payer: Self-pay | Admitting: Critical Care Medicine

## 2011-03-28 ENCOUNTER — Encounter: Payer: Self-pay | Admitting: Surgery

## 2011-03-28 ENCOUNTER — Other Ambulatory Visit: Payer: Self-pay

## 2011-03-28 ENCOUNTER — Institutional Professional Consult (permissible substitution) (INDEPENDENT_AMBULATORY_CARE_PROVIDER_SITE_OTHER): Payer: Medicare Other | Admitting: Surgery

## 2011-03-28 ENCOUNTER — Other Ambulatory Visit: Payer: Self-pay | Admitting: Surgery

## 2011-03-28 ENCOUNTER — Ambulatory Visit: Payer: Medicare Other

## 2011-03-28 VITALS — BP 142/80 | HR 82 | Resp 20 | Ht 71.0 in | Wt 205.0 lb

## 2011-03-28 DIAGNOSIS — I2510 Atherosclerotic heart disease of native coronary artery without angina pectoris: Secondary | ICD-10-CM

## 2011-03-28 NOTE — Telephone Encounter (Signed)
No message needed °

## 2011-03-28 NOTE — Progress Notes (Signed)
[address]            [place] [address]          [phone]      PCP is Oliver Barre, MD, MD Referring Provider is Wendall Stade, MD  Chief Complaint  Patient presents with  . Coronary Artery Disease    Referral from Dr Eden Emms for surgical eval, cardiac cath  02/02/11    HPI:  The patient is a 78 year old gentleman with a history of significant pulmonary problems that started last summer around July with bronchiectasis and repeat pulmonary infections, one of which required a 9 day hospital stay. He was treated with steroids for postinflammatory pulmonary fibrosis and was sent home on oxygen which has  been weaned off. A cardiac workup was performed since the patient continued to have episodes of shortness of breath associated with substernal chest discomfort. He reports that these episodes have been occurring with less and less exertion. His wife has also noticed exertional fatigue. Cardiac catheterization was performed on 02/02/2011 and showed significant two-vessel coronary disease with normal left ventricular function. It was felt by Dr. Eden Emms that he may be having significant anginal symptoms due to to ischemia.  Past Medical History  Diagnosis Date  . Bronchiectasis   . Nephrolithiasis   . Pneumonia   . Hyperlipidemia   . Insomnia   . Alcohol abuse   . Asthmatic bronchitis   . Pleurisy   . GERD (gastroesophageal reflux disease)   . Diverticulosis     Colonoscopy 2007/Dr. Leone Payor  . Hx of colonic polyp     Past Surgical History  Procedure Date  . Tonsillectomy     Family History  Problem Relation Age of Onset  . Heart disease Mother   . Colon cancer Father   . Alcohol abuse Father   . Colon cancer Brother   . Lung cancer Brother   . Breast cancer Sister   . Heart disease Other     maternal side     Social History History  Substance Use Topics  . Smoking status: Former Smoker -- 1.5 packs/day for 10 years    Types:  Cigarettes    Quit date: 03/20/1956  . Smokeless tobacco: Former Neurosurgeon    Types: Snuff, Chew  . Alcohol Use: Yes    Current Outpatient Prescriptions  Medication Sig Dispense Refill  . albuterol (PROAIR HFA) 108 (90 BASE) MCG/ACT inhaler Inhale 2 puffs into the lungs 4 (four) times daily as needed.       Marland Kitchen aspirin 81 MG tablet Take 81 mg by mouth daily.        . beclomethasone (QVAR) 80 MCG/ACT inhaler Inhale 2 puffs into the lungs 2 (two) times daily.  1 Inhaler  6  . guaifenesin (HUMIBID E) 400 MG TABS Take 400 mg by mouth 2 (two) times daily as needed.       . isosorbide mononitrate (IMDUR) 60 MG 24 hr tablet Take 1 tablet (60 mg total) by mouth daily.  30 tablet  12  . LORazepam (ATIVAN) 1 MG tablet Take 1 mg by mouth 2 (two) times daily.        . nitroGLYCERIN (NITROSTAT) 0.4 MG SL tablet Place 0.4 mg under the tongue every 5 (five) minutes as needed.        Marland Kitchen omeprazole (PRILOSEC) 20 MG capsule Take 1 capsule (20 mg total)  by mouth daily.  30 capsule  5    Allergies  Allergen Reactions  . Codeine     Review of Systems:  General: He denies any fever or chills. He has had no recent weight loss. He does report exertional fatigue. Eyes: Negative ENT: Negative Endocrine: He denies diabetes and hypothyroidism. Cardiac: He does report exertional substernal chest discomfort, shortness of breath, and fatigue. He denies orthopnea and PND. He has had no palpitations or peripheral edema. Respiratory: He denies cough and sputum production. GI: He denies nausea and vomiting. He has had no melena or bright red blood per rectum. GU: He denies dysuria and hematuria. Neurological: He denies any focal weakness or numbness. He does have occasional dizziness and some difficulty with balance and uses a walker or cane. He does report a tremor. He reports having a small stroke in the past. Hematological: He denies any history of bleeding disorders or easy bleeding. Psychiatric: He denies any history  of depression. He does report anxiety attacks and is on chronic lorazepam. Musculoskeletal: He does report arthritis.  BP 142/80  Pulse 82  Resp 20  Ht 5\' 11"  (1.803 m)  Wt 205 lb (92.987 kg)  BMI 28.59 kg/m2  SpO2 98% Physical Exam:  He is an elderly, white male in no distress. HEENT: Normocephalic and atraumatic. Pupils are equal and reactive to light and accommodation. Extraocular muscles are intact. Oropharynx is clear. Neck: Carotid pulses are palpable bilaterally. There no bruits. There is no adenopathy or thyromegaly. Heart: Regular rate and rhythm with normal S1 and S2. There is no murmur, rub, or gallop. Lungs: Clear Abdomen: Bowel sounds are present. Abdomen is soft and nontender. There are no palpable masses or organomegaly. Extremities: No peripheral edema. Pedal pulses are palpable bilaterally. He has severe contraction of the fingers of his right hand, being worse in the fourth and fifth fingers. Skin: Warm and dry. Neurological: He is alert and oriented x3. He has obvious tremor involving his head and upper extremities. Motor strength is 5 out of 5 in all extremities. Sensation is grossly intact.  Diagnostic Tests:  Hemodynamics:  Right atrial pressure is 7/6 / 5 Right ventricular pressure: 25/8-mean of 8 Pulmonary capillary wedge pressure is 7 Pulmonary artery pressure: 26/9-mean of 16 Left ventricular pressures 123/8 [name] pressure is 119/56  Aortic saturation: 90% Pulmonary artery saturation: 65% Hemoglobin 14.3  Fick cardiac output equals 5.5 L a minute Thermodilution cardiac output is 4.9 L per minute  Coronary angiograms: Standard catheters were used including a Judkins left 4 and a 3-D RC (4 [place]).  The catheters were exchanged out over a guidewire.   Coronary angiography:  The left main is fairly smooth and normal.  Left anterior descending artery:  The LAD has mild to moderate calcifications. There is a proximal 70% stenosis. There is a mid 80-90%  stenosis and a terminal 80-90% stenosis right before the bifurcation. The first diagonal artery is fairly small branch that originates after the proximal stenosis. There is no significant irregularities.  Left circumflex artery: The left circumflex artery provides flow to a large piece marginal branch. The left complex artery is subtotally occluded just after giving off the first acute marginal branch. This continuation branch fills very slowly.  The first acute marginal artery has minor luminal regularities.  Right coronary artery: The right coronary artery is severely and diffusely diseased. There are 80-90% stenosis in the proximal vessel, mid vessel, and distally. There is a tight 90% stenosis just prior to the  bifurcation. There is a tight 90% stenosis in the mid/distal posterior descending artery. The posterior lateral branch has mild diffuse irregular disease.  Left ventriculogram: The left ventriculogram was performed in the 30 RAO position. It reveals overall normal left ventricular systolic function with an ejection fraction of 60%.   PFTs: FEV1 is 2.77 or 107% of predicted. Lung volumes are normal. Diffusion capacity is moderately reduced.  Impression:  He has severe two-vessel coronary disease with significant anginal symptoms. This may certainly exacerbate his pulmonary problems. He is fairly limited in what he can do now without getting chest discomfort and shortness of breath. I think it is best to proceed with coronary bypass graft surgery. His lung function is clearly adequate and his coronary vessels are graftable. I discussed the operative procedure with the patient and family including alternatives, benefits and risks; including but not limited to bleeding, blood transfusion, infection, stroke, myocardial infarction, graft failure, heart block requiring a permanent pacemaker, organ dysfunction, and death.  [facility], [facility] understands and agrees to proceed.  We will schedule  surgery for next Friday at his request.  Plan: Plan CABG Friday 03/31/11

## 2011-03-29 ENCOUNTER — Other Ambulatory Visit: Payer: Self-pay

## 2011-03-29 ENCOUNTER — Ambulatory Visit: Payer: Medicare Other | Admitting: Critical Care Medicine

## 2011-03-29 ENCOUNTER — Encounter (HOSPITAL_COMMUNITY)
Admission: RE | Admit: 2011-03-29 | Discharge: 2011-03-29 | Disposition: A | Discharge status: Home / Self Care | Payer: Medicare Other | Source: Ambulatory Visit | Attending: Surgery | Admitting: Surgery

## 2011-03-29 ENCOUNTER — Encounter (HOSPITAL_COMMUNITY): Payer: Self-pay

## 2011-03-29 ENCOUNTER — Ambulatory Visit (HOSPITAL_COMMUNITY)
Admission: RE | Admit: 2011-03-29 | Discharge: 2011-03-29 | Disposition: A | Discharge status: Home / Self Care | Payer: Medicare Other | Source: Ambulatory Visit | Attending: Surgery | Admitting: Surgery

## 2011-03-29 ENCOUNTER — Ambulatory Visit: Payer: Medicare Other

## 2011-03-29 ENCOUNTER — Telehealth: Payer: Self-pay | Admitting: Cardiovascular Disease

## 2011-03-29 DIAGNOSIS — Z01812 Encounter for preprocedural laboratory examination: Secondary | ICD-10-CM | POA: Insufficient documentation

## 2011-03-29 DIAGNOSIS — I2510 Atherosclerotic heart disease of native coronary artery without angina pectoris: Secondary | ICD-10-CM | POA: Insufficient documentation

## 2011-03-29 DIAGNOSIS — I517 Cardiomegaly: Secondary | ICD-10-CM | POA: Insufficient documentation

## 2011-03-29 DIAGNOSIS — Z01810 Encounter for preprocedural cardiovascular examination: Secondary | ICD-10-CM

## 2011-03-29 DIAGNOSIS — Z01818 Encounter for other preprocedural examination: Secondary | ICD-10-CM | POA: Insufficient documentation

## 2011-03-29 HISTORY — DX: Personal history of other diseases of the digestive system: Z87.19

## 2011-03-29 HISTORY — DX: Anemia, unspecified: D64.9

## 2011-03-29 HISTORY — DX: Reserved for inherently not codable concepts without codable children: IMO0001

## 2011-03-29 HISTORY — DX: Encounter for other specified aftercare: Z51.89

## 2011-03-29 HISTORY — DX: Anxiety disorder, unspecified: F41.9

## 2011-03-29 HISTORY — DX: Unspecified osteoarthritis, unspecified site: M19.90

## 2011-03-29 HISTORY — DX: Myoneural disorder, unspecified: G70.9

## 2011-03-29 HISTORY — DX: Chronic obstructive pulmonary disease, unspecified: J44.9

## 2011-03-29 HISTORY — DX: Shortness of breath: R06.02

## 2011-03-29 HISTORY — DX: Atherosclerotic heart disease of native coronary artery without angina pectoris: I25.10

## 2011-03-29 LAB — URINALYSIS, ROUTINE W REFLEX MICROSCOPIC
Glucose, UA: NEGATIVE mg/dL
Hgb urine dipstick: NEGATIVE
Leukocytes, UA: NEGATIVE
Specific Gravity, Urine: 1.016 (ref 1.005–1.030)
pH: 6 (ref 5.0–8.0)

## 2011-03-29 LAB — BLOOD GAS, ARTERIAL
Drawn by: 344381
FIO2: 0.21 %
O2 Saturation: 97.4 %
Patient temperature: 98.6
pH, Arterial: 7.451 — ABNORMAL HIGH (ref 7.350–7.450)

## 2011-03-29 LAB — COMPREHENSIVE METABOLIC PANEL
ALT: 13 U/L (ref 0–53)
AST: 15 U/L (ref 0–37)
Albumin: 3.7 g/dL (ref 3.5–5.2)
Calcium: 9.6 mg/dL (ref 8.4–10.5)
GFR calc Af Amer: >90 mL/min (ref >90)
Sodium: 138 mEq/L (ref 135–145)
Total Protein: 6.7 g/dL (ref 6.0–8.3)

## 2011-03-29 LAB — TYPE AND SCREEN
Antibody Screen: NEGATIVE
Unit division: 0

## 2011-03-29 LAB — HEMOGLOBIN A1C
Hgb A1c MFr Bld: 5.8 % — ABNORMAL HIGH (ref <5.7)
Mean Plasma Glucose: 120 mg/dL — ABNORMAL HIGH (ref <117)

## 2011-03-29 LAB — CBC
MCH: 31.8 pg (ref 26.0–34.0)
MCHC: 34.1 g/dL (ref 30.0–36.0)
Platelets: 158 10*3/uL (ref 150–400)
RDW: 13.3 % (ref 11.5–15.5)

## 2011-03-29 LAB — APTT: aPTT: 27 seconds (ref 24–37)

## 2011-03-29 LAB — SURGICAL PCR SCREEN
MRSA, PCR: NEGATIVE
Staphylococcus aureus: NEGATIVE

## 2011-03-29 LAB — PROTIME-INR: Prothrombin Time: 13.2 seconds (ref 11.6–15.2)

## 2011-03-29 NOTE — Telephone Encounter (Signed)
FU Call: pt wife returning call from our office. Please call back to discuss further.

## 2011-03-29 NOTE — Progress Notes (Signed)
Confirmed with Geraldine Contras, Dr. Laneta Simmers, patient has already had PFT"s done.  Will only need doppler studies done on 03/29/11

## 2011-03-29 NOTE — Progress Notes (Signed)
VASCULAR LAB PRELIMINARY  PRELIMINARY  PRELIMINARY  PRELIMINARY  Pre-op Cardiac Surgery  Carotid Findings:  Carotid Dopplers were performed on 02/27/11 at [facility] the results reported were: Right - 0 - 39% ICA stenosis Left - 60 - 79% ICA stenosis  Upper Extremity Right Left  Brachial Pressures 139 127  Radial Waveforms Triphasic Triphasic  Ulnar Waveforms Triphasic Monophasic  Palmar Arch (Allen's Test) Right Doppler waveforms obliterates with radial compression and decreases greater than 50% with ulnar compression. Left Doppler waveforms obliterates with radial compression and remains normal with ulnar compression.   Findings:      Lower  Extremity Right Left  Dorsalis Pedis 74 81  Anterior Tibial    Posterior Tibial 68 81  Ankle/Brachial Indices 0.53 0.58    Findings:  ABIs indicates a moderate reduction in arterial blood flow bilaterally at rest.   Matthew Bridges, 03/29/2011, 3:01 PM

## 2011-03-29 NOTE — Pre-Procedure Instructions (Signed)
20 Keyion Knack  03/29/2011   Your procedure is scheduled on:  Friday March 31, 2011  Report to [facility] at 0530 AM.  Call this number if you have problems the morning of surgery: [phone]   Remember:   Do not eat food:After Midnight.  May have clear liquids: up to 4 Hours before arrival. (up to 1:30am)  Clear liquids include soda, tea, black coffee, apple or grape juice, broth.  Take these medicines the morning of surgery with A SIP OF WATER: albuterol, qvar, isosorbide, lorazepam, omeprazole   Do not wear jewelry, make-up or nail polish.  Do not wear lotions, powders, or perfumes. You may wear deodorant.  Do not shave 48 hours prior to surgery.  Do not bring valuables to the hospital.  Contacts, dentures or bridgework may not be worn into surgery.  Leave suitcase in the car. After surgery it may be brought to your room.  For patients admitted to the hospital, checkout time is 11:00 AM the day of discharge.   Patients discharged the day of surgery will not be allowed to drive home.  Name and phone number of your driver: family/friend  Special Instructions: Incentive Spirometry - Practice and bring it with you on the day of surgery. and CHG Shower Use Special Wash: 1/2 bottle night before surgery and 1/2 bottle morning of surgery.   Please read over the following fact sheets that you were given: Pain Booklet, Coughing and Deep Breathing, Blood Transfusion Information, Open Heart Packet, MRSA Information and Surgical Site Infection Prevention

## 2011-03-29 NOTE — Telephone Encounter (Signed)
PER PT'S WIFE  PT  TO HAVE CABG THIS WEEK DR BARTLE DOING PROCEDURE .Matthew Bridges

## 2011-03-30 MED ORDER — SODIUM CHLORIDE 0.9 % IV SOLN
0.1000 ug/kg/h | INTRAVENOUS | Status: AC
  Administered 2011-03-31: .2 ug/kg/h via INTRAVENOUS
  Filled 2011-03-30: qty 4

## 2011-03-30 MED ORDER — SODIUM CHLORIDE 0.9 % IV SOLN
INTRAVENOUS | Status: AC
  Administered 2011-03-31: 1.2 [IU]/h via INTRAVENOUS
  Filled 2011-03-30: qty 1

## 2011-03-30 MED ORDER — MAGNESIUM SULFATE 50 % IJ SOLN
40.0000 meq | INTRAMUSCULAR | Status: DC
  Filled 2011-03-30: qty 10

## 2011-03-30 MED ORDER — POTASSIUM CHLORIDE 2 MEQ/ML IV SOLN
80.0000 meq | INTRAVENOUS | Status: DC
  Filled 2011-03-30: qty 40

## 2011-03-30 MED ORDER — VANCOMYCIN HCL 1000 MG IV SOLR
1500.0000 mg | INTRAVENOUS | Status: AC
  Administered 2011-03-31: 1500 mg via INTRAVENOUS
  Filled 2011-03-30 (×3): qty 1500

## 2011-03-30 MED ORDER — PHENYLEPHRINE HCL 10 MG/ML IJ SOLN
30.0000 ug/min | INTRAVENOUS | Status: DC
  Filled 2011-03-30: qty 2

## 2011-03-30 MED ORDER — EPINEPHRINE HCL 1 MG/ML IJ SOLN
0.5000 ug/min | INTRAVENOUS | Status: DC
  Filled 2011-03-30: qty 4

## 2011-03-30 MED ORDER — DOPAMINE-DEXTROSE 3.2-5 MG/ML-% IV SOLN
2.0000 ug/kg/min | INTRAVENOUS | Status: DC
  Filled 2011-03-30: qty 250

## 2011-03-30 MED ORDER — MOXIFLOXACIN HCL IN NACL 400 MG/250ML IV SOLN
400.0000 mg | INTRAVENOUS | Status: AC
  Administered 2011-03-31: 400 mg via INTRAVENOUS
  Filled 2011-03-30 (×2): qty 250

## 2011-03-30 MED ORDER — PAPAVERINE HCL 30 MG/ML IJ SOLN
INTRAMUSCULAR | Status: AC
  Administered 2011-03-31: 15:00:00
  Filled 2011-03-30: qty 0.5

## 2011-03-30 MED ORDER — SODIUM CHLORIDE 0.9 % IV SOLN
INTRAVENOUS | Status: AC
  Administered 2011-03-31: 69.8 mL/h via INTRAVENOUS
  Filled 2011-03-30: qty 40

## 2011-03-30 MED ORDER — NITROGLYCERIN IN D5W 200-5 MCG/ML-% IV SOLN
2.0000 ug/min | INTRAVENOUS | Status: AC
  Administered 2011-03-31: 5 ug/min via INTRAVENOUS
  Filled 2011-03-30: qty 250

## 2011-03-30 MED ORDER — METOPROLOL TARTRATE 12.5 MG HALF TABLET: 12.5000 mg | ORAL_TABLET | Freq: Once | ORAL | Status: DC

## 2011-03-30 NOTE — Consult Note (Signed)
Anesthesia:  Patient is a 78 year old male scheduled for a CABG on 03/31/11.  His history is significant for CAD, GERD, HLD, ETOH abuse, COPD, hypersensitivity pneumonitis with bronchiectasis and postinflammatory pulmonary fibrosis 2012, HH, anemia, anxiety, and former smoker.   His primary Cardiologist is Dr. Eden Emms.  His cath was one on 02/02/11 (see Notes tab, under Op Note) and showed 3V CAD, EF 60%.  EKG on 03/29/11 showed NSR with nonspecific T wave changes.  His Pulmonologist is Dr. Delford Field.  PFTs: FEV1 is 2.77 or 107% of predicted. Lung volumes are normal. Diffusion capacity is moderately reduced.   Labs noted. WBC elevated at 14.8K.  UA negative for leukocytes and nitrites.  CXR shows stable mild cardiomegaly and pulmonary interstitial prominence. No  acute findings.  He was afebrile at PAT.    Plan to proceed if no acute changes.

## 2011-03-31 ENCOUNTER — Other Ambulatory Visit: Payer: Self-pay

## 2011-03-31 ENCOUNTER — Ambulatory Visit (HOSPITAL_COMMUNITY): Payer: Medicare Other | Admitting: Vascular Surgery

## 2011-03-31 ENCOUNTER — Encounter (HOSPITAL_COMMUNITY): Payer: Self-pay | Admitting: Surgery

## 2011-03-31 ENCOUNTER — Inpatient Hospital Stay (HOSPITAL_COMMUNITY): Payer: Medicare Other

## 2011-03-31 ENCOUNTER — Encounter (HOSPITAL_COMMUNITY): Payer: Self-pay | Admitting: Vascular Surgery

## 2011-03-31 ENCOUNTER — Inpatient Hospital Stay (HOSPITAL_COMMUNITY)
Admission: RE | Admit: 2011-03-31 | Discharge: 2011-04-10 | DRG: 236 | Disposition: A | Discharge status: Skilled Nursing Facility | Payer: Medicare Other | Source: Ambulatory Visit | Attending: Surgery | Admitting: Surgery

## 2011-03-31 ENCOUNTER — Encounter (HOSPITAL_COMMUNITY)
Admission: RE | Disposition: A | Discharge status: Skilled Nursing Facility | Payer: Self-pay | Source: Ambulatory Visit | Attending: Surgery

## 2011-03-31 DIAGNOSIS — F1011 Alcohol abuse, in remission: Secondary | ICD-10-CM | POA: Diagnosis present

## 2011-03-31 DIAGNOSIS — I4891 Unspecified atrial fibrillation: Secondary | ICD-10-CM | POA: Diagnosis not present

## 2011-03-31 DIAGNOSIS — K5730 Diverticulosis of large intestine without perforation or abscess without bleeding: Secondary | ICD-10-CM | POA: Diagnosis present

## 2011-03-31 DIAGNOSIS — Z01812 Encounter for preprocedural laboratory examination: Secondary | ICD-10-CM

## 2011-03-31 DIAGNOSIS — I2510 Atherosclerotic heart disease of native coronary artery without angina pectoris: Principal | ICD-10-CM

## 2011-03-31 DIAGNOSIS — D62 Acute posthemorrhagic anemia: Secondary | ICD-10-CM | POA: Diagnosis not present

## 2011-03-31 DIAGNOSIS — K219 Gastro-esophageal reflux disease without esophagitis: Secondary | ICD-10-CM | POA: Diagnosis present

## 2011-03-31 DIAGNOSIS — R1310 Dysphagia, unspecified: Secondary | ICD-10-CM | POA: Diagnosis present

## 2011-03-31 DIAGNOSIS — J479 Bronchiectasis, uncomplicated: Secondary | ICD-10-CM | POA: Diagnosis present

## 2011-03-31 DIAGNOSIS — E785 Hyperlipidemia, unspecified: Secondary | ICD-10-CM | POA: Diagnosis present

## 2011-03-31 HISTORY — PX: CORONARY ARTERY BYPASS GRAFT: SHX141

## 2011-03-31 HISTORY — DX: Presence of aortocoronary bypass graft: Z95.1

## 2011-03-31 LAB — POCT I-STAT 4, (NA,K, GLUC, HGB,HCT)
Glucose, Bld: 96 mg/dL (ref 70–99)
Glucose, Bld: 103 mg/dL — ABNORMAL HIGH (ref 70–99)
HCT: 35 % — ABNORMAL LOW (ref 39.0–52.0)
HCT: 25 % — ABNORMAL LOW (ref 39.0–52.0)
HCT: 31 % — ABNORMAL LOW (ref 39.0–52.0)
HCT: 25 % — ABNORMAL LOW (ref 39.0–52.0)
Hemoglobin: 8.5 g/dL — ABNORMAL LOW (ref 13.0–17.0)
Hemoglobin: 8.5 g/dL — ABNORMAL LOW (ref 13.0–17.0)
Hemoglobin: 7.8 g/dL — ABNORMAL LOW (ref 13.0–17.0)
Potassium: 3.6 mEq/L (ref 3.5–5.1)
Potassium: 4.6 mEq/L (ref 3.5–5.1)
Potassium: 4.9 mEq/L (ref 3.5–5.1)
Sodium: 143 mEq/L (ref 135–145)
Sodium: 137 mEq/L (ref 135–145)
Sodium: 143 mEq/L (ref 135–145)

## 2011-03-31 LAB — POCT I-STAT 3, ART BLOOD GAS (G3+)
Patient temperature: 97.5
pCO2 arterial: 44.9 mmHg (ref 35.0–45.0)
pH, Arterial: 7.353 (ref 7.350–7.450)
pH, Arterial: 7.364 (ref 7.350–7.450)

## 2011-03-31 LAB — CBC
HCT: 22.2 % — ABNORMAL LOW (ref 39.0–52.0)
Hemoglobin: 7.6 g/dL — ABNORMAL LOW (ref 13.0–17.0)
MCH: 31.4 pg (ref 26.0–34.0)
MCHC: 34.2 g/dL (ref 30.0–36.0)
MCV: 91.7 fL (ref 78.0–100.0)
RBC: 2.42 MIL/uL — ABNORMAL LOW (ref 4.22–5.81)

## 2011-03-31 LAB — PREPARE PLATELET PHERESIS: Unit division: 0

## 2011-03-31 LAB — HEMOGLOBIN AND HEMATOCRIT, BLOOD: Hemoglobin: 8.1 g/dL — ABNORMAL LOW (ref 13.0–17.0)

## 2011-03-31 LAB — PREPARE RBC (CROSSMATCH)

## 2011-03-31 SURGERY — CORONARY ARTERY BYPASS GRAFTING (CABG)
Anesthesia: General | Site: Chest | Wound class: Clean

## 2011-03-31 MED ORDER — CHLORHEXIDINE GLUCONATE 4 % EX LIQD: 30.0000 mL | CUTANEOUS | Status: DC

## 2011-03-31 MED ORDER — LACTATED RINGERS IV SOLN
INTRAVENOUS | Status: AC
  Administered 2011-03-31: 21:00:00 via INTRAVENOUS

## 2011-03-31 MED ORDER — PHENYLEPHRINE HCL 10 MG/ML IJ SOLN
INTRAMUSCULAR | Status: DC | PRN
  Administered 2011-03-31: 40 ug via INTRAVENOUS
  Administered 2011-03-31: 20 ug via INTRAVENOUS

## 2011-03-31 MED ORDER — HEMOSTATIC AGENTS (NO CHARGE) OPTIME
TOPICAL | Status: DC | PRN
  Administered 2011-03-31: 1 via TOPICAL

## 2011-03-31 MED ORDER — MORPHINE SULFATE 2 MG/ML IJ SOLN
1.0000 mg | INTRAMUSCULAR | Status: DC | PRN
  Administered 2011-04-01 (×3): 2 mg via INTRAVENOUS
  Filled 2011-03-31 (×3): qty 1

## 2011-03-31 MED ORDER — LACTATED RINGERS IV SOLN
INTRAVENOUS | Status: DC | PRN
  Administered 2011-03-31: 15:00:00 via INTRAVENOUS

## 2011-03-31 MED ORDER — ONDANSETRON HCL 4 MG/2ML IJ SOLN
4.0000 mg | Freq: Four times a day (QID) | INTRAMUSCULAR | Status: DC | PRN
  Administered 2011-04-01 – 2011-04-03 (×2): 4 mg via INTRAVENOUS
  Filled 2011-03-31 (×2): qty 2

## 2011-03-31 MED ORDER — PHENYLEPHRINE HCL 10 MG/ML IJ SOLN
20.0000 mg | INTRAVENOUS | Status: DC | PRN
  Administered 2011-03-31: 10 ug/min via INTRAVENOUS

## 2011-03-31 MED ORDER — SODIUM CHLORIDE 0.9 % IV SOLN: 250.0000 mL | INTRAVENOUS | Status: DC

## 2011-03-31 MED ORDER — FENTANYL CITRATE 0.05 MG/ML IJ SOLN
INTRAMUSCULAR | Status: DC | PRN
  Administered 2011-03-31: 250 ug via INTRAVENOUS
  Administered 2011-03-31: 50 ug via INTRAVENOUS
  Administered 2011-03-31: 150 ug via INTRAVENOUS
  Administered 2011-03-31 (×3): 250 ug via INTRAVENOUS
  Administered 2011-03-31: 750 ug via INTRAVENOUS
  Administered 2011-03-31: 250 ug via INTRAVENOUS
  Administered 2011-03-31: 50 ug via INTRAVENOUS
  Administered 2011-03-31: 250 ug via INTRAVENOUS

## 2011-03-31 MED ORDER — ACETAMINOPHEN 160 MG/5ML PO SOLN
975.0000 mg | Freq: Four times a day (QID) | ORAL | Status: DC
  Administered 2011-04-02: 975 mg
  Filled 2011-03-31 (×2): qty 40.6

## 2011-03-31 MED ORDER — ROCURONIUM BROMIDE 100 MG/10ML IV SOLN
INTRAVENOUS | Status: DC | PRN
  Administered 2011-03-31: 50 mg via INTRAVENOUS

## 2011-03-31 MED ORDER — VECURONIUM BROMIDE 10 MG IV SOLR
INTRAVENOUS | Status: DC | PRN
  Administered 2011-03-31 (×2): 10 mg via INTRAVENOUS

## 2011-03-31 MED ORDER — MIDAZOLAM HCL 2 MG/2ML IJ SOLN: 2.0000 mg | INTRAMUSCULAR | Status: DC | PRN

## 2011-03-31 MED ORDER — INSULIN REGULAR BOLUS VIA INFUSION
0.0000 [IU] | Freq: Three times a day (TID) | INTRAVENOUS | Status: DC
  Filled 2011-03-31 (×4): qty 10

## 2011-03-31 MED ORDER — LACTATED RINGERS IV SOLN
INTRAVENOUS | Status: DC
Start: 2011-03-31 — End: 2011-03-31
  Administered 2011-03-31: 14:00:00 via INTRAVENOUS

## 2011-03-31 MED ORDER — LACTATED RINGERS IV SOLN
INTRAVENOUS | Status: DC | PRN
  Administered 2011-03-31 (×2): via INTRAVENOUS

## 2011-03-31 MED ORDER — ACETAMINOPHEN 160 MG/5ML PO SOLN: 650.0000 mg | ORAL | Status: AC

## 2011-03-31 MED ORDER — SODIUM CHLORIDE 0.9 % IJ SOLN: 3.0000 mL | Freq: Two times a day (BID) | INTRAMUSCULAR | Status: DC

## 2011-03-31 MED ORDER — ASPIRIN 81 MG PO CHEW: 324.0000 mg | CHEWABLE_TABLET | ORAL | Status: DC

## 2011-03-31 MED ORDER — LACTATED RINGERS IV SOLN
INTRAVENOUS | Status: DC
Start: 2011-03-31 — End: 2011-03-31
  Administered 2011-03-31: 13:00:00 via INTRAVENOUS

## 2011-03-31 MED ORDER — MAGNESIUM SULFATE 40 MG/ML IJ SOLN
4.0000 g | Freq: Once | INTRAMUSCULAR | Status: AC
  Administered 2011-03-31: 4 g via INTRAVENOUS
  Filled 2011-03-31: qty 100

## 2011-03-31 MED ORDER — PROPOFOL 10 MG/ML IV EMUL
INTRAVENOUS | Status: DC | PRN
  Administered 2011-03-31: 30 mg via INTRAVENOUS
  Administered 2011-03-31 (×2): 50 mg via INTRAVENOUS

## 2011-03-31 MED ORDER — ASPIRIN EC 325 MG PO TBEC
325.0000 mg | DELAYED_RELEASE_TABLET | Freq: Every day | ORAL | Status: DC
  Administered 2011-04-01 – 2011-04-10 (×10): 325 mg via ORAL
  Filled 2011-03-31 (×10): qty 1

## 2011-03-31 MED ORDER — DOCUSATE SODIUM 100 MG PO CAPS
200.0000 mg | ORAL_CAPSULE | Freq: Every day | ORAL | Status: DC
  Administered 2011-04-01 – 2011-04-10 (×9): 200 mg via ORAL
  Filled 2011-03-31 (×10): qty 2

## 2011-03-31 MED ORDER — ACETAMINOPHEN 500 MG PO TABS
1000.0000 mg | ORAL_TABLET | Freq: Four times a day (QID) | ORAL | Status: DC
  Administered 2011-04-01 – 2011-04-03 (×6): 1000 mg via ORAL
  Filled 2011-03-31 (×18): qty 2

## 2011-03-31 MED ORDER — SODIUM CHLORIDE 0.45 % IV SOLN: INTRAVENOUS | Status: DC

## 2011-03-31 MED ORDER — PROTAMINE SULFATE 10 MG/ML IV SOLN
INTRAVENOUS | Status: DC | PRN
  Administered 2011-03-31 (×6): 50 mg via INTRAVENOUS

## 2011-03-31 MED ORDER — SODIUM CHLORIDE 0.9 % IV SOLN: INTRAVENOUS | Status: DC

## 2011-03-31 MED ORDER — HEPARIN SODIUM (PORCINE) 1000 UNIT/ML IJ SOLN
INTRAMUSCULAR | Status: DC | PRN
  Administered 2011-03-31: 35000 [IU] via INTRAVENOUS

## 2011-03-31 MED ORDER — ASPIRIN 81 MG PO CHEW: 324.0000 mg | CHEWABLE_TABLET | Freq: Every day | ORAL | Status: DC

## 2011-03-31 MED ORDER — THROMBIN 20000 UNITS EX KIT: PACK | CUTANEOUS | Status: DC | PRN

## 2011-03-31 MED ORDER — PHENYLEPHRINE HCL 10 MG/ML IJ SOLN: 0.0000 ug/min | INTRAVENOUS | Status: DC

## 2011-03-31 MED ORDER — PANTOPRAZOLE SODIUM 40 MG PO TBEC
40.0000 mg | DELAYED_RELEASE_TABLET | Freq: Every day | ORAL | Status: DC
  Administered 2011-04-03 – 2011-04-10 (×8): 40 mg via ORAL
  Filled 2011-03-31 (×9): qty 1

## 2011-03-31 MED ORDER — SODIUM CHLORIDE 0.45 % IV SOLN
INTRAVENOUS | Status: DC
  Administered 2011-03-31: 21:00:00 via INTRAVENOUS

## 2011-03-31 MED ORDER — ACETAMINOPHEN 650 MG RE SUPP
650.0000 mg | RECTAL | Status: AC
  Administered 2011-03-31: 650 mg via RECTAL

## 2011-03-31 MED ORDER — BISACODYL 5 MG PO TBEC
10.0000 mg | DELAYED_RELEASE_TABLET | Freq: Every day | ORAL | Status: DC
  Administered 2011-04-01 – 2011-04-07 (×7): 10 mg via ORAL
  Filled 2011-03-31 (×7): qty 2

## 2011-03-31 MED ORDER — VANCOMYCIN HCL 1000 MG IV SOLR
1000.0000 mg | Freq: Once | INTRAVENOUS | Status: DC
  Filled 2011-03-31 (×3): qty 1000

## 2011-03-31 MED ORDER — SODIUM CHLORIDE 0.9 % IJ SOLN: 3.0000 mL | INTRAMUSCULAR | Status: DC | PRN

## 2011-03-31 MED ORDER — MORPHINE SULFATE 4 MG/ML IJ SOLN
2.0000 mg | INTRAMUSCULAR | Status: DC | PRN
  Administered 2011-04-01 (×3): 2 mg via INTRAVENOUS
  Filled 2011-03-31 (×2): qty 1

## 2011-03-31 MED ORDER — SODIUM CHLORIDE 0.9 % IJ SOLN
10.0000 mL | INTRAMUSCULAR | Status: DC | PRN
  Administered 2011-03-31: 10 mL via INTRAVENOUS

## 2011-03-31 MED ORDER — SODIUM CHLORIDE 0.9 % IJ SOLN
3.0000 mL | Freq: Two times a day (BID) | INTRAMUSCULAR | Status: DC
  Administered 2011-04-01 – 2011-04-03 (×6): 3 mL via INTRAVENOUS

## 2011-03-31 MED ORDER — LACTATED RINGERS IV SOLN: INTRAVENOUS | Status: DC

## 2011-03-31 MED ORDER — METOPROLOL TARTRATE 1 MG/ML IV SOLN: 2.5000 mg | INTRAVENOUS | Status: DC | PRN

## 2011-03-31 MED ORDER — BISACODYL 10 MG RE SUPP: 10.0000 mg | Freq: Every day | RECTAL | Status: DC

## 2011-03-31 MED ORDER — HETASTARCH-ELECTROLYTES 6 % IV SOLN
INTRAVENOUS | Status: DC | PRN
  Administered 2011-03-31: 20:00:00 via INTRAVENOUS

## 2011-03-31 MED ORDER — MOXIFLOXACIN HCL IN NACL 400 MG/250ML IV SOLN
400.0000 mg | Freq: Once | INTRAVENOUS | Status: AC
  Administered 2011-04-01: 400 mg via INTRAVENOUS
  Filled 2011-03-31: qty 250

## 2011-03-31 MED ORDER — SODIUM CHLORIDE 0.9 % IV SOLN
250.0000 mL | INTRAVENOUS | Status: DC
  Administered 2011-03-31: 250 mL via INTRAVENOUS

## 2011-03-31 MED ORDER — PAPAVERINE HCL 30 MG/ML IJ SOLN
INTRAMUSCULAR | Status: DC | PRN
  Administered 2011-03-31: 60 mg via INTRAVENOUS

## 2011-03-31 MED ORDER — METOPROLOL TARTRATE 25 MG/10 ML ORAL SUSPENSION
12.5000 mg | Freq: Two times a day (BID) | ORAL | Status: DC
  Administered 2011-04-01 – 2011-04-02 (×2): 12.5 mg
  Filled 2011-03-31 (×11): qty 5

## 2011-03-31 MED ORDER — MIDAZOLAM HCL 5 MG/5ML IJ SOLN
INTRAMUSCULAR | Status: DC | PRN
  Administered 2011-03-31: 1 mg via INTRAVENOUS
  Administered 2011-03-31 (×3): 2 mg via INTRAVENOUS
  Administered 2011-03-31: 4 mg via INTRAVENOUS
  Administered 2011-03-31: 1 mg via INTRAVENOUS

## 2011-03-31 MED ORDER — FAMOTIDINE IN NACL 20-0.9 MG/50ML-% IV SOLN
20.0000 mg | Freq: Two times a day (BID) | INTRAVENOUS | Status: AC
  Administered 2011-03-31: 20 mg via INTRAVENOUS

## 2011-03-31 MED ORDER — SODIUM CHLORIDE 0.9 % IV SOLN
INTRAVENOUS | Status: DC
  Administered 2011-03-31: 22:00:00 via INTRAVENOUS

## 2011-03-31 MED ORDER — SODIUM CHLORIDE 0.9 % IV SOLN
0.1000 ug/kg/h | INTRAVENOUS | Status: DC
  Filled 2011-03-31: qty 2

## 2011-03-31 MED ORDER — ALBUMIN HUMAN 5 % IV SOLN
250.0000 mL | INTRAVENOUS | Status: DC | PRN
  Administered 2011-03-31: 250 mL via INTRAVENOUS

## 2011-03-31 MED ORDER — THROMBIN 20000 UNITS EX KIT
PACK | OROMUCOSAL | Status: DC | PRN
  Administered 2011-03-31 (×2): via TOPICAL

## 2011-03-31 MED ORDER — METOPROLOL TARTRATE 12.5 MG HALF TABLET
12.5000 mg | ORAL_TABLET | Freq: Two times a day (BID) | ORAL | Status: DC
  Administered 2011-04-02 – 2011-04-05 (×5): 12.5 mg via ORAL
  Filled 2011-03-31 (×11): qty 1

## 2011-03-31 MED ORDER — POTASSIUM CHLORIDE 10 MEQ/50ML IV SOLN
10.0000 meq | INTRAVENOUS | Status: AC
  Administered 2011-03-31 (×3): 10 meq via INTRAVENOUS

## 2011-03-31 MED ORDER — NITROGLYCERIN IN D5W 200-5 MCG/ML-% IV SOLN: 0.0000 ug/min | INTRAVENOUS | Status: DC

## 2011-03-31 SURGICAL SUPPLY — 116 items
ADAPTER CARDIO PERF ANTE/RETRO (ADAPTER) IMPLANT
ATTRACTOMAT 16X20 MAGNETIC DRP (DRAPES) ×2 IMPLANT
BAG DECANTER FOR FLEXI CONT (MISCELLANEOUS) ×2 IMPLANT
BANDAGE ELASTIC 4 VELCRO ST LF (GAUZE/BANDAGES/DRESSINGS) ×2 IMPLANT
BANDAGE ELASTIC 6 VELCRO ST LF (GAUZE/BANDAGES/DRESSINGS) ×2 IMPLANT
BANDAGE GAUZE ELAST BULKY 4 IN (GAUZE/BANDAGES/DRESSINGS) ×2 IMPLANT
BASKET HEART (ORDER IN 25'S) (MISCELLANEOUS) ×1
BASKET HEART (ORDER IN 25S) (MISCELLANEOUS) ×1 IMPLANT
BLADE SAW STERNAL (BLADE) ×2 IMPLANT
BLADE SURG 11 STRL SS (BLADE) ×4 IMPLANT
BLADE SURG ROTATE 9660 (MISCELLANEOUS) IMPLANT
BNDG COHESIVE 1X5 TAN STRL LF (GAUZE/BANDAGES/DRESSINGS) ×2 IMPLANT
CANISTER SUCTION 2500CC (MISCELLANEOUS) ×2 IMPLANT
CANNULA GUNDRY RCSP 15FR (MISCELLANEOUS) IMPLANT
CATH ROBINSON RED A/P 18FR (CATHETERS) ×4 IMPLANT
CATH THORACIC 28FR (CATHETERS) ×2 IMPLANT
CATH THORACIC 28FR RT ANG (CATHETERS) IMPLANT
CATH THORACIC 36FR (CATHETERS) ×2 IMPLANT
CATH THORACIC 36FR RT ANG (CATHETERS) ×2 IMPLANT
CLIP FOGARTY SPRING 6M (CLIP) IMPLANT
CLIP RETRACTION 3.0MM CORONARY (MISCELLANEOUS) ×2 IMPLANT
CLIP TI MEDIUM 24 (CLIP) IMPLANT
CLIP TI WIDE RED SMALL 24 (CLIP) ×2 IMPLANT
CLOTH BEACON ORANGE TIMEOUT ST (SAFETY) ×2 IMPLANT
COVER SURGICAL LIGHT HANDLE (MISCELLANEOUS) ×4 IMPLANT
CRADLE DONUT ADULT HEAD (MISCELLANEOUS) ×2 IMPLANT
DRAPE CARDIOVASCULAR INCISE (DRAPES) ×1
DRAPE SLUSH MACHINE 52X66 (DRAPES) IMPLANT
DRAPE SLUSH/WARMER DISC (DRAPES) IMPLANT
DRAPE SRG 135X102X78XABS (DRAPES) ×1 IMPLANT
DRSG COVADERM 4X14 (GAUZE/BANDAGES/DRESSINGS) ×2 IMPLANT
ELECT BLADE 4.0 EZ CLEAN MEGAD (MISCELLANEOUS) ×2
ELECT CAUTERY BLADE 6.4 (BLADE) ×2 IMPLANT
ELECT REM PT RETURN 9FT ADLT (ELECTROSURGICAL) ×4
ELECTRODE BLDE 4.0 EZ CLN MEGD (MISCELLANEOUS) ×1 IMPLANT
ELECTRODE REM PT RTRN 9FT ADLT (ELECTROSURGICAL) ×2 IMPLANT
GLOVE BIO SURGEON STRL SZ 6 (GLOVE) IMPLANT
GLOVE BIO SURGEON STRL SZ 6.5 (GLOVE) IMPLANT
GLOVE BIO SURGEON STRL SZ7 (GLOVE) IMPLANT
GLOVE BIO SURGEON STRL SZ7.5 (GLOVE) IMPLANT
GLOVE BIOGEL PI IND STRL 6 (GLOVE) ×1 IMPLANT
GLOVE BIOGEL PI IND STRL 6.5 (GLOVE) IMPLANT
GLOVE BIOGEL PI IND STRL 7.0 (GLOVE) ×4 IMPLANT
GLOVE BIOGEL PI INDICATOR 6 (GLOVE) ×1
GLOVE BIOGEL PI INDICATOR 6.5 (GLOVE)
GLOVE BIOGEL PI INDICATOR 7.0 (GLOVE) ×4
GLOVE EUDERMIC 7 POWDERFREE (GLOVE) ×4 IMPLANT
GLOVE ORTHO TXT STRL SZ7.5 (GLOVE) IMPLANT
GLOVE SURG EUDERMIC 8 LTX PF (GLOVE) ×4 IMPLANT
GOWN PREVENTION PLUS XLARGE (GOWN DISPOSABLE) ×8 IMPLANT
GOWN STRL NON-REIN LRG LVL3 (GOWN DISPOSABLE) ×10 IMPLANT
HEMOSTAT POWDER SURGIFOAM 1G (HEMOSTASIS) ×4 IMPLANT
HEMOSTAT SURGICEL 2X14 (HEMOSTASIS) ×2 IMPLANT
INSERT FOGARTY 61MM (MISCELLANEOUS) IMPLANT
INSERT FOGARTY XLG (MISCELLANEOUS) IMPLANT
KIT BASIN OR (CUSTOM PROCEDURE TRAY) ×2 IMPLANT
KIT CATH CPB BARTLE (MISCELLANEOUS) ×2 IMPLANT
KIT ROOM TURNOVER OR (KITS) ×2 IMPLANT
KIT SUCTION CATH 14FR (SUCTIONS) ×2 IMPLANT
KIT VASOVIEW W/TROCAR VH 2000 (KITS) ×2 IMPLANT
NS IRRIG 1000ML POUR BTL (IV SOLUTION) ×10 IMPLANT
PACK OPEN HEART (CUSTOM PROCEDURE TRAY) ×2 IMPLANT
PAD ARMBOARD 7.5X6 YLW CONV (MISCELLANEOUS) ×4 IMPLANT
PENCIL BUTTON HOLSTER BLD 10FT (ELECTRODE) ×2 IMPLANT
PUNCH AORTIC ROTATE 4.0MM (MISCELLANEOUS) IMPLANT
PUNCH AORTIC ROTATE 4.5MM 8IN (MISCELLANEOUS) ×2 IMPLANT
PUNCH AORTIC ROTATE 5MM 8IN (MISCELLANEOUS) IMPLANT
SET CARDIOPLEGIA MPS 5001102 (MISCELLANEOUS) ×2 IMPLANT
SOLUTION ANTI FOG 6CC (MISCELLANEOUS) IMPLANT
SPONGE GAUZE 4X4 12PLY (GAUZE/BANDAGES/DRESSINGS) ×4 IMPLANT
SPONGE INTESTINAL PEANUT (DISPOSABLE) IMPLANT
SPONGE LAP 18X18 X RAY DECT (DISPOSABLE) ×4 IMPLANT
SPONGE LAP 4X18 X RAY DECT (DISPOSABLE) ×2 IMPLANT
STAPLER VISISTAT 35W (STAPLE) ×4 IMPLANT
SUT BONE WAX W31G (SUTURE) ×2 IMPLANT
SUT MNCRL AB 4-0 PS2 18 (SUTURE) ×2 IMPLANT
SUT PROLENE 3 0 SH DA (SUTURE) IMPLANT
SUT PROLENE 3 0 SH1 36 (SUTURE) IMPLANT
SUT PROLENE 4 0 RB 1 (SUTURE)
SUT PROLENE 4 0 SH DA (SUTURE) IMPLANT
SUT PROLENE 4-0 RB1 .5 CRCL 36 (SUTURE) IMPLANT
SUT PROLENE 5 0 C 1 36 (SUTURE) IMPLANT
SUT PROLENE 6 0 C 1 30 (SUTURE) IMPLANT
SUT PROLENE 6 0 CC (SUTURE) IMPLANT
SUT PROLENE 7 0 BV 1 (SUTURE) IMPLANT
SUT PROLENE 7 0 BV1 MDA (SUTURE) ×2 IMPLANT
SUT PROLENE 7.0 RB 3 (SUTURE) ×2 IMPLANT
SUT PROLENE 8 0 BV175 6 (SUTURE) IMPLANT
SUT SILK  1 MH (SUTURE) ×1
SUT SILK 1 MH (SUTURE) ×1 IMPLANT
SUT SILK 2 0 SH CR/8 (SUTURE) ×2 IMPLANT
SUT SILK 3 0 SH CR/8 (SUTURE) IMPLANT
SUT STEEL STERNAL CCS#1 18IN (SUTURE) IMPLANT
SUT STEEL SZ 6 DBL 3X14 BALL (SUTURE) ×6 IMPLANT
SUT VIC AB 1 CTX 36 (SUTURE) ×4
SUT VIC AB 1 CTX36XBRD ANBCTR (SUTURE) ×4 IMPLANT
SUT VIC AB 2-0 CT1 27 (SUTURE) ×2
SUT VIC AB 2-0 CT1 TAPERPNT 27 (SUTURE) ×2 IMPLANT
SUT VIC AB 2-0 CTX 27 (SUTURE) ×4 IMPLANT
SUT VIC AB 3-0 SH 27 (SUTURE)
SUT VIC AB 3-0 SH 27X BRD (SUTURE) IMPLANT
SUT VIC AB 3-0 X1 27 (SUTURE) ×2 IMPLANT
SUT VICRYL 4-0 PS2 18IN ABS (SUTURE) IMPLANT
SUTURE E-PAK OPEN HEART (SUTURE) ×2 IMPLANT
SYSTEM SAHARA CHEST DRAIN ATS (WOUND CARE) ×2 IMPLANT
SYSTEM SAHARA CHEST DRAIN RE-I (WOUND CARE) ×2 IMPLANT
TAPE CLOTH SURG 4X10 WHT LF (GAUZE/BANDAGES/DRESSINGS) ×2 IMPLANT
TOWEL OR 17X24 6PK STRL BLUE (TOWEL DISPOSABLE) ×2 IMPLANT
TOWEL OR 17X26 10 PK STRL BLUE (TOWEL DISPOSABLE) ×2 IMPLANT
TRAY FOLEY IC TEMP SENS 14FR (CATHETERS) ×2 IMPLANT
TUBE CONNECTING 12X1/4 (SUCTIONS) ×2 IMPLANT
TUBE SUCT INTRACARD DLP 20F (MISCELLANEOUS) ×2 IMPLANT
TUBING INSUFFLATION 10FT LAP (TUBING) ×2 IMPLANT
UNDERPAD 30X30 INCONTINENT (UNDERPADS AND DIAPERS) ×2 IMPLANT
WATER STERILE IRR 1000ML POUR (IV SOLUTION) ×4 IMPLANT
YANKAUER SUCT BULB TIP NO VENT (SUCTIONS) ×4 IMPLANT

## 2011-03-31 NOTE — Anesthesia Preprocedure Evaluation (Addendum)
Anesthesia Evaluation  Patient identified by MRN, date of birth, ID band Patient awake    Reviewed: Allergy & Precautions, H&P , NPO status , Patient's Chart, lab work & pertinent test results  Airway Mallampati: I TM Distance: >3 FB Neck ROM: Full    Dental No notable dental hx. (+) Teeth Intact and Dental Advisory Given   Pulmonary pneumonia  (treated in November), COPD COPD inhaler, Recent URI , Resolved, former smoker clear to auscultation  Pulmonary exam normal       Cardiovascular + CAD Regular Normal 11/12 Cath- 2 vessel disease 90% LAD, 90% RCA, EF 60%   Neuro/Psych Anxiety    GI/Hepatic Neg liver ROS, GERD-  Medicated and Controlled,  Endo/Other  Negative Endocrine ROS  Renal/GU negative Renal ROS     Musculoskeletal negative musculoskeletal ROS (+)   Abdominal   Peds  Hematology negative hematology ROS (+)   Anesthesia Other Findings   Reproductive/Obstetrics                          Anesthesia Physical Anesthesia Plan  ASA: III  Anesthesia Plan: General   Post-op Pain Management:    Induction: Intravenous  Airway Management Planned: Oral ETT  Additional Equipment: Arterial line and PA Cath  Intra-op Plan:   Post-operative Plan: Post-operative intubation/ventilation  Informed Consent: I have reviewed the patients History and Physical, chart, labs and discussed the procedure including the risks, benefits and alternatives for the proposed anesthesia with the patient or authorized representative who has indicated his/her understanding and acceptance.   Dental advisory given  Plan Discussed with: CRNA and Surgeon  Anesthesia Plan Comments: (Plan routine monitors, A-line, PA catheter, GETA with post op ventilation)        Anesthesia Quick Evaluation

## 2011-03-31 NOTE — Anesthesia Postprocedure Evaluation (Signed)
  Anesthesia Post-op Note  Patient: Matthew Bridges  Procedure(s) Performed:  CORONARY ARTERY BYPASS GRAFTING (CABG) - Times three using the mammary and right leg greater saphenous vein.   Patient Location: PACU and SICU  Anesthesia Type: General  Level of Consciousness: Patient remains intubated per anesthesia plan  Airway and Oxygen Therapy: Patient placed on Ventilator (see vital sign flow sheet for setting)  Post-op Pain: Controlled  Post-op Assessment: Post-op Vital signs reviewed  Post-op Vital Signs: stable  Complications: No apparent anesthesia complications

## 2011-03-31 NOTE — Transfer of Care (Signed)
Immediate Anesthesia Transfer of Care Note  Patient: Matthew Bridges  Procedure(s) Performed:  CORONARY ARTERY BYPASS GRAFTING (CABG) - Times three using the mammary and right leg greater saphenous vein.   Patient Location: SICU  Anesthesia Type: General  Level of Consciousness: sedated  Airway & Oxygen Therapy: Patient remains intubated per anesthesia plan  Post-op Assessment: Report given to PACU RN and Post -op Vital signs reviewed and stable  Post vital signs: Reviewed Filed Vitals:   03/31/11 0753  BP: 148/69  Pulse: 68  Temp: 36.4 C  Resp: 18    Complications: No apparent anesthesia complications

## 2011-03-31 NOTE — OR Nursing (Signed)
Second call to SICU at 19:33.

## 2011-03-31 NOTE — OR Nursing (Signed)
First call to SICU and Jolinda Croak at 18:40.

## 2011-03-31 NOTE — H&P (Signed)
[address]            [place] Brid[place] [address]          [phone]     History and Physical Examination   PCP is Oliver Barre, MD, MD  Referring Provider is Wendall Stade, MD  Chief Complaint   Patient presents with   .  Coronary Artery Disease     Referral from Dr Eden Emms for surgical eval, cardiac cath 02/02/11    HPI:  The patient is a 78 year old gentleman with a history of significant pulmonary problems that started last summer around July with bronchiectasis and repeat pulmonary infections, one of which required a 9 day hospital stay. He was treated with steroids for postinflammatory pulmonary fibrosis and was sent home on oxygen which has been weaned off. A cardiac workup was performed since the patient continued to have episodes of shortness of breath associated with substernal chest discomfort. He reports that these episodes have been occurring with less and less exertion. His wife has also noticed exertional fatigue. Cardiac catheterization was performed on 02/02/2011 and showed significant two-vessel coronary disease with normal left ventricular function. It was felt by Dr. Eden Emms that he may be having significant anginal symptoms due to to ischemia.  Past Medical History   Diagnosis  Date   .  Bronchiectasis    .  Nephrolithiasis    .  Pneumonia    .  Hyperlipidemia    .  Insomnia    .  Alcohol abuse    .  Asthmatic bronchitis    .  Pleurisy    .  GERD (gastroesophageal reflux disease)    .  Diverticulosis      Colonoscopy 2007/Dr. Leone Payor   .  Hx of colonic polyp     Past Surgical History   Procedure  Date   .  Tonsillectomy     Family History   Problem  Relation  Age of Onset   .  Heart disease  Mother    .  Colon cancer  Father    .  Alcohol abuse  Father    .  Colon cancer  Brother    .  Lung cancer  Brother    .  Breast cancer  Sister    .  Heart disease  Other       maternal side    Social History  History     Substance Use Topics   .  Smoking status:  Former Smoker -- 1.5 packs/day for 10 years     Types:  Cigarettes     Quit date:  03/20/1956   .  Smokeless tobacco:  Former Neurosurgeon     Types:  Snuff, Chew   .  Alcohol Use:  Yes    Current Outpatient Prescriptions   Medication  Sig  Dispense  Refill   .  albuterol (PROAIR HFA) 108 (90 BASE) MCG/ACT inhaler  Inhale 2 puffs into the lungs 4 (four) times daily as needed.     Marland Kitchen  aspirin 81 MG tablet  Take 81 mg by mouth daily.     .  beclomethasone (QVAR) 80 MCG/ACT inhaler  Inhale 2 puffs into the lungs 2 (two) times daily.  1 Inhaler  6   .  guaifenesin (HUMIBID E) 400 MG TABS  Take 400 mg by mouth 2 (two) times daily as needed.     Marland Kitchen  isosorbide mononitrate (IMDUR) 60 MG 24 hr tablet  Take 1 tablet (60 mg total) by mouth daily.  30 tablet  12   .  LORazepam (ATIVAN) 1 MG tablet  Take 1 mg by mouth 2 (two) times daily.     .  nitroGLYCERIN (NITROSTAT) 0.4 MG SL tablet  Place 0.4 mg under the tongue every 5 (five) minutes as needed.     Marland Kitchen  omeprazole (PRILOSEC) 20 MG capsule  Take 1 capsule (20 mg total) by mouth daily.  30 capsule  5    Allergies   Allergen  Reactions   .  Codeine     Review of Systems:  General: He denies any fever or chills. He has had no recent weight loss. He does report exertional fatigue.  Eyes: Negative  ENT: Negative  Endocrine: He denies diabetes and hypothyroidism.  Cardiac: He does report exertional substernal chest discomfort, shortness of breath, and fatigue. He denies orthopnea and PND. He has had no palpitations or peripheral edema.  Respiratory: He denies cough and sputum production.  GI: He denies nausea and vomiting. He has had no melena or bright red blood per rectum.  GU: He denies dysuria and hematuria.  Neurological: He denies any focal weakness or numbness. He does have occasional dizziness and some difficulty with balance and uses a walker or cane. He does report a tremor. He reports having a small  stroke in the past.  Hematological: He denies any history of bleeding disorders or easy bleeding.  Psychiatric: He denies any history of depression. He does report anxiety attacks and is on chronic lorazepam.  Musculoskeletal: He does report arthritis.  BP 142/80  Pulse 82  Resp 20  Ht 5\' 11"  (1.803 m)  Wt 205 lb (92.987 kg)  BMI 28.59 kg/m2  SpO2 98%  Physical Exam:  He is an elderly, white male in no distress.  HEENT: Normocephalic and atraumatic. Pupils are equal and reactive to light and accommodation. Extraocular muscles are intact. Oropharynx is clear.  Neck: Carotid pulses are palpable bilaterally. There no bruits. There is no adenopathy or thyromegaly.  Heart: Regular rate and rhythm with normal S1 and S2. There is no murmur, rub, or gallop.  Lungs: Clear  Abdomen: Bowel sounds are present. Abdomen is soft and nontender. There are no palpable masses or organomegaly.  Extremities: No peripheral edema. Pedal pulses are palpable bilaterally. He has severe contraction of the fingers of his right hand, being worse in the fourth and fifth fingers.  Skin: Warm and dry.  Neurological: He is alert and oriented x3. He has obvious tremor involving his head and upper extremities. Motor strength is 5 out of 5 in all extremities. Sensation is grossly intact.  Diagnostic Tests:  Hemodynamics:  Right atrial pressure is 7/6 / 5  Right ventricular pressure: 25/8-mean of 8  Pulmonary capillary wedge pressure is 7  Pulmonary artery pressure: 26/9-mean of 16  Left ventricular pressures 123/8  [name] pressure is 119/56  Aortic saturation: 90%  Pulmonary artery saturation: 65%  Hemoglobin 14.3  Fick cardiac output equals 5.5 L a minute  Thermodilution cardiac output is 4.9 L per minute  Coronary angiograms: Standard catheters were used including a Judkins left 4 and a 3-D RC (4 [place]). The catheters were exchanged out over a guidewire.  Coronary angiography:  The left main is fairly smooth and  normal.  Left anterior descending artery: The LAD has mild to moderate calcifications. There is a proximal 70% stenosis. There is a  mid 80-90% stenosis and a terminal 80-90% stenosis right before the bifurcation. The first diagonal artery is fairly small branch that originates after the proximal stenosis. There is no significant irregularities.  Left circumflex artery:  The left circumflex artery provides flow to a large piece marginal branch. The left complex artery is subtotally occluded just after giving off the first acute marginal branch. This continuation branch fills very slowly. The first acute marginal artery has minor luminal regularities.  Right coronary artery:  The right coronary artery is severely and diffusely diseased. There are 80-90% stenosis in the proximal vessel, mid vessel, and distally. There is a tight 90% stenosis just prior to the bifurcation. There is a tight 90% stenosis in the mid/distal posterior descending artery. The posterior lateral branch has mild diffuse irregular disease.  Left ventriculogram:  The left ventriculogram was performed in the 30 RAO position. It reveals overall normal left ventricular systolic function with an ejection fraction of 60%.  PFTs: FEV1 is 2.77 or 107% of predicted. Lung volumes are normal. Diffusion capacity is moderately reduced.  Impression:  He has severe two-vessel coronary disease with significant anginal symptoms. This may certainly exacerbate his pulmonary problems. He is fairly limited in what he can do now without getting chest discomfort and shortness of breath. I think it is best to proceed with coronary bypass graft surgery. His lung function is clearly adequate and his coronary vessels are graftable. I discussed the operative procedure with the patient and family including alternatives, benefits and risks; including but not limited to bleeding, blood transfusion, infection, stroke, myocardial infarction, graft failure, heart block  requiring a permanent pacemaker, organ dysfunction, and death. [facility] understands and agrees to proceed. We will schedule surgery for next Friday at his request.  Plan:  Plan CABG Friday 03/31/11

## 2011-03-31 NOTE — Brief Op Note (Signed)
03/31/2011  8:43 PM  PATIENT:  Faye Ramsay  78 y.o. male  PRE-OPERATIVE DIAGNOSIS:  CAD  POST-OPERATIVE DIAGNOSIS:  Coronary Artery Disease  PROCEDURE:  Procedure(s): CORONARY ARTERY BYPASS GRAFTING (CABG) x3 LIMA to LAD SEQ SVG to distal RCA to PDA EVH right leg.  SURGEON:  Surgeon(s): [name], MD  PHYSICIAN ASSISTANT: none  ASSISTANTS: Al Corpus, SA ANESTHESIA:   general  EBL:  Total I/O In: 1500 [I.V.:1000; IV Piggyback:500] Out: 400 [Urine:400]  BLOOD ADMINISTERED:1 unit PLTS  To SICU stable

## 2011-03-31 NOTE — Preoperative (Signed)
Beta Blockers   Reason not to administer Beta Blockers:Not Applicable 

## 2011-03-31 NOTE — Interval H&P Note (Signed)
History and Physical Interval Note:  03/31/2011 2:01 PM  Matthew Bridges  has presented today for surgery, with the diagnosis of CAD  The various methods of treatment have been discussed with the patient and family. After consideration of risks, benefits and other options for treatment, the patient has consented to  Procedure(s): CORONARY ARTERY BYPASS GRAFTING (CABG) as a surgical intervention .  The patients' history has been reviewed, patient examined, no change in status, stable for surgery.  I have reviewed the patients' chart and labs.  Questions were answered to the patient's satisfaction.     [name]

## 2011-03-31 NOTE — OR Nursing (Signed)
Staples placed in right leg prior to dressing.

## 2011-04-01 ENCOUNTER — Inpatient Hospital Stay (HOSPITAL_COMMUNITY): Payer: Medicare Other

## 2011-04-01 LAB — BASIC METABOLIC PANEL
BUN: 12 mg/dL (ref 6–23)
CO2: 23 mEq/L (ref 19–32)
Chloride: 112 mEq/L (ref 96–112)
Creatinine, Ser: 0.7 mg/dL (ref 0.50–1.35)
Glucose, Bld: 149 mg/dL — ABNORMAL HIGH (ref 70–99)

## 2011-04-01 LAB — POCT I-STAT 3, ART BLOOD GAS (G3+)
Bicarbonate: 24.3 mEq/L — ABNORMAL HIGH (ref 20.0–24.0)
Patient temperature: 38
Patient temperature: 37.9
TCO2: 26 mmol/L (ref 0–100)
pH, Arterial: 7.374 (ref 7.350–7.450)

## 2011-04-01 LAB — CBC
HCT: 23.4 % — ABNORMAL LOW (ref 39.0–52.0)
HCT: 25.7 % — ABNORMAL LOW (ref 39.0–52.0)
Hemoglobin: 7.9 g/dL — ABNORMAL LOW (ref 13.0–17.0)
MCH: 29.9 pg (ref 26.0–34.0)
MCV: 88.6 fL (ref 78.0–100.0)
MCV: 90.2 fL (ref 78.0–100.0)
Platelets: 150 10*3/uL (ref 150–400)
Platelets: 153 10*3/uL (ref 150–400)
RBC: 2.64 MIL/uL — ABNORMAL LOW (ref 4.22–5.81)
RBC: 2.85 MIL/uL — ABNORMAL LOW (ref 4.22–5.81)
WBC: 11.1 10*3/uL — ABNORMAL HIGH (ref 4.0–10.5)
WBC: 12.5 10*3/uL — ABNORMAL HIGH (ref 4.0–10.5)

## 2011-04-01 LAB — GLUCOSE, CAPILLARY
Glucose-Capillary: 79 mg/dL (ref 70–99)
Glucose-Capillary: 145 mg/dL — ABNORMAL HIGH (ref 70–99)
Glucose-Capillary: 148 mg/dL — ABNORMAL HIGH (ref 70–99)
Glucose-Capillary: 136 mg/dL — ABNORMAL HIGH (ref 70–99)

## 2011-04-01 LAB — POCT I-STAT, CHEM 8
Calcium, Ion: 1.19 mmol/L (ref 1.12–1.32)
Chloride: 107 mEq/L (ref 96–112)
Glucose, Bld: 123 mg/dL — ABNORMAL HIGH (ref 70–99)
HCT: 25 % — ABNORMAL LOW (ref 39.0–52.0)
TCO2: 24 mmol/L (ref 0–100)

## 2011-04-01 LAB — MAGNESIUM
Magnesium: 2.5 mg/dL (ref 1.5–2.5)
Magnesium: 2.3 mg/dL (ref 1.5–2.5)

## 2011-04-01 LAB — CREATININE, SERUM
GFR calc Af Amer: >90 mL/min (ref >90)
GFR calc non Af Amer: 86 mL/min — ABNORMAL LOW (ref >90)

## 2011-04-01 MED ORDER — LORAZEPAM 0.5 MG PO TABS
0.5000 mg | ORAL_TABLET | Freq: Every day | ORAL | Status: DC
  Administered 2011-04-01: 0.5 mg via ORAL
  Filled 2011-04-01: qty 1

## 2011-04-01 MED ORDER — INSULIN ASPART 100 UNIT/ML ~~LOC~~ SOLN
0.0000 [IU] | SUBCUTANEOUS | Status: DC
  Administered 2011-04-01 (×2): 2 [IU] via SUBCUTANEOUS
  Administered 2011-04-02: 4 [IU] via SUBCUTANEOUS
  Administered 2011-04-02 – 2011-04-03 (×5): 2 [IU] via SUBCUTANEOUS

## 2011-04-01 MED ORDER — TRAMADOL HCL 50 MG PO TABS
50.0000 mg | ORAL_TABLET | ORAL | Status: DC | PRN
  Administered 2011-04-01 – 2011-04-03 (×3): 50 mg via ORAL
  Filled 2011-04-01 (×3): qty 1

## 2011-04-01 MED ORDER — POTASSIUM CHLORIDE 10 MEQ/50ML IV SOLN: 10.0000 meq | Freq: Once | INTRAVENOUS | Status: DC

## 2011-04-01 MED ORDER — AMIODARONE LOAD VIA INFUSION
150.0000 mg | Freq: Once | INTRAVENOUS | Status: AC
  Administered 2011-04-01: 150 mg via INTRAVENOUS
  Filled 2011-04-01: qty 83.34

## 2011-04-01 MED ORDER — INSULIN ASPART 100 UNIT/ML ~~LOC~~ SOLN
0.0000 [IU] | SUBCUTANEOUS | Status: DC
  Administered 2011-04-01: 2 [IU] via SUBCUTANEOUS
  Filled 2011-04-01: qty 3

## 2011-04-01 MED ORDER — AMIODARONE HCL IN DEXTROSE 360-4.14 MG/200ML-% IV SOLN
1.0000 mg/min | INTRAVENOUS | Status: AC
  Administered 2011-04-01 (×2): 1 mg/min via INTRAVENOUS
  Filled 2011-04-01: qty 200

## 2011-04-01 MED ORDER — POTASSIUM CHLORIDE 10 MEQ/50ML IV SOLN
INTRAVENOUS | Status: AC
  Administered 2011-04-01: 19:00:00
  Filled 2011-04-01: qty 50

## 2011-04-01 MED ORDER — INSULIN ASPART 100 UNIT/ML ~~LOC~~ SOLN
0.0000 [IU] | SUBCUTANEOUS | Status: AC
  Administered 2011-04-01 (×3): 2 [IU] via SUBCUTANEOUS
  Filled 2011-04-01: qty 3

## 2011-04-01 MED ORDER — AMIODARONE HCL IN DEXTROSE 360-4.14 MG/200ML-% IV SOLN
0.5000 mg/min | INTRAVENOUS | Status: DC
  Filled 2011-04-01 (×6): qty 200

## 2011-04-01 MED ORDER — POTASSIUM CHLORIDE 10 MEQ/50ML IV SOLN
10.0000 meq | INTRAVENOUS | Status: AC
  Administered 2011-04-01 (×3): 10 meq via INTRAVENOUS

## 2011-04-01 MED ORDER — ALBUTEROL SULFATE (5 MG/ML) 0.5% IN NEBU
2.5000 mg | INHALATION_SOLUTION | RESPIRATORY_TRACT | Status: DC | PRN
  Administered 2011-04-01: 2.5 mg via RESPIRATORY_TRACT
  Filled 2011-04-01: qty 0.5

## 2011-04-01 NOTE — Progress Notes (Addendum)
1 Day Post-Op Procedure(s) (LRB): CORONARY ARTERY BYPASS GRAFTING (CABG) (N/A)  Subjective: Wife at bedside.He is pleasantly confused this am. He is oriented to place .  Objective: Vital signs in last 24 hours: Patient Vitals for the past 24 hrs:  BP Temp Temp src Pulse Resp SpO2 Height Weight  04/01/11 0800 - 99.5 F (37.5 C) - 91  16  98 % - -  04/01/11 0700 - 99.9 F (37.7 C) - 82  13  99 % - -  04/01/11 0600 - 99.7 F (37.6 C) - 89  17  98 % - -  04/01/11 0500 - 99.9 F (37.7 C) - 92  22  100 % - 212 lb 1.3 oz (96.2 kg)  04/01/11 0430 - - - - - 100 % - -  04/01/11 0400 - 100.2 F (37.9 C) Core 72  14  100 % - -  04/01/11 0332 - 100.4 F (38 C) - 73  21  100 % - -  04/01/11 0300 - 100.4 F (38 C) - 75  21  95 % - -  04/01/11 0224 - 100.6 F (38.1 C) - 75  21  98 % - -   Pre op weight  92.5 kg Current Weight  04/01/11 212 lb 1.3 oz (96.2 kg)    Hemodynamic parameters for last 24 hours: PAP: (26-42)/(11-21) 26/11 mmHg CO:  [4.1 L/min-8 L/min] 8 L/min CI:  [2 L/min/m2-3.8 L/min/m2] 3.8 L/min/m2  Intake/Output from previous day: 01/11 0701 - 01/12 0700 In: 6552 [P.O.:120; I.V.:3859; Blood:1173; IV Piggyback:1400] Out: 4635 [Urine:4205; Chest Tube:430]   Physical Exam:  Cardiovascular: RRR, no murmurs, gallops, or rubs. Pulmonary: Diminished at bases; no rales, wheezes, or rhonchi. Abdomen: Soft, non tender, bowel sounds present. Extremities: Mild bilateral lower extremity edema. Wounds: Dressings clean and dry.  No erythema or signs of infection.  Lab Results: CBC: Basename 04/01/11 0404 03/31/11 2045  WBC 11.1* 13.5*  HGB 7.9* 7.6*  HCT 23.4* 22.2*  PLT 150 160   BMET:  Basename 04/01/11 0404 03/31/11 2038 03/29/11 1525  NA 142 143 --  K 3.6 3.8 --  CL 112 -- 103  CO2 23 -- 21  GLUCOSE 149* 102* --  BUN 12 -- 17  CREATININE 0.70 -- 0.75  CALCIUM 7.2* -- 9.6    PT/INR:  Basename 03/31/11 2045  LABPROT 18.6*  INR 1.52*   ABG:  INR: Will add  last result for INR, ABG once components are confirmed Will add last 4 CBG results once components are confirmed  Assessment/Plan:  1. CV - SR.IKG shows SR and prolonged QT interval (less now than now). Hold Lopressor if SBP less than 100. 2.  Pulmonary - Encourage incentive spirometer. 3. Volume Overload - Begin gentle diuresis when bp improves. 4.  Acute blood loss anemia - H/H slightly increased to 7.9/23.4. Consider transfusion. 5.Supplement Potassium (given 3 runs). 6.HGA1C pre op 5.8. Will need follow up with medical doctor for further surveillance upon discharge. 7.Confusion-only on Morphine IV for pain is is allergic to codeine. Will change to Ultram. 8.D/c swan, aline, mediastinal tubes. Likely d/c pleural tubes later today.   ZIMMERMAN,DONIELLE MPA-C 04/01/2011   Now in A-fib rate 90-100 No neuro findings of concern except disorientation Family at bedside

## 2011-04-01 NOTE — Procedures (Signed)
Extubation Procedure Note  Patient Details:   Name: Kraven Calk DOB: 01-21-1931 MRN: [id number]   Airway Documentation:  Airway 8 mm (Active)  Secured at (cm) 22 cm 03/31/2011 11:57 PM  Measured From Lips 03/31/2011 11:57 PM  Secured Location Right 03/31/2011 11:57 PM  Secured By Caron Presume Tape 03/31/2011 11:57 PM    Evaluation  O2 sats: stable throughout Complications: No apparent complications Patient did tolerate procedure well. Bilateral Breath Sounds: Clear   Yes  Tineka Uriegas, Thelma Barge 04/01/2011, 2:27 AM

## 2011-04-01 NOTE — Progress Notes (Signed)
Patient awake and following commands. SICU rapid wean protocol initiated.

## 2011-04-02 ENCOUNTER — Inpatient Hospital Stay (HOSPITAL_COMMUNITY): Payer: Medicare Other

## 2011-04-02 LAB — CBC
MCH: 30.6 pg (ref 26.0–34.0)
MCV: 90.6 fL (ref 78.0–100.0)
Platelets: 128 10*3/uL — ABNORMAL LOW (ref 150–400)
RBC: 2.45 MIL/uL — ABNORMAL LOW (ref 4.22–5.81)

## 2011-04-02 LAB — BASIC METABOLIC PANEL
CO2: 27 mEq/L (ref 19–32)
Calcium: 8 mg/dL — ABNORMAL LOW (ref 8.4–10.5)
Creatinine, Ser: 0.76 mg/dL (ref 0.50–1.35)
Glucose, Bld: 135 mg/dL — ABNORMAL HIGH (ref 70–99)

## 2011-04-02 LAB — GLUCOSE, CAPILLARY
Glucose-Capillary: 124 mg/dL — ABNORMAL HIGH (ref 70–99)
Glucose-Capillary: 111 mg/dL — ABNORMAL HIGH (ref 70–99)
Glucose-Capillary: 162 mg/dL — ABNORMAL HIGH (ref 70–99)

## 2011-04-02 LAB — PREPARE RBC (CROSSMATCH)

## 2011-04-02 MED ORDER — ENOXAPARIN SODIUM 30 MG/0.3ML ~~LOC~~ SOLN
30.0000 mg | SUBCUTANEOUS | Status: DC
  Administered 2011-04-02 – 2011-04-10 (×10): 30 mg via SUBCUTANEOUS
  Filled 2011-04-02 (×11): qty 0.3

## 2011-04-02 MED ORDER — SODIUM CHLORIDE 0.9 % IV SOLN
INTRAVENOUS | Status: DC
  Administered 2011-04-02 – 2011-04-03 (×2): 50 mL/h via INTRAVENOUS

## 2011-04-02 MED ORDER — FUROSEMIDE 10 MG/ML IJ SOLN
40.0000 mg | Freq: Once | INTRAMUSCULAR | Status: AC
  Administered 2011-04-02: 40 mg via INTRAVENOUS
  Filled 2011-04-02: qty 4

## 2011-04-02 NOTE — Progress Notes (Signed)
Physical Therapy Evaluation Patient Details Name: Matthew Bridges MRN: [id number] DOB: 02/28/1931 Today's Date: 04/02/2011  Problem List:  Patient Active Problem List  Diagnoses  . HYPERLIPIDEMIA, MILD  . Alcohol abuse, in remission  . Other chronic pain  . BRONCHIECTASIS  . COPD  . GERD  . DIVERTICULOSIS-COLON  . CONSTIPATION  . INSOMNIA  . PERSONAL HX COLONIC POLYPS  . HX OF GALLSTONE  . NEPHROLITHIASIS, HX OF  . Postinflammatory pulmonary fibrosis  . Preventative health care  . Epistaxis  . Anxiety  . Dizziness  . Chest pain on exertion    Past Medical History:  Past Medical History  Diagnosis Date  . Bronchiectasis   . Nephrolithiasis   . Pneumonia   . Hyperlipidemia   . Insomnia   . Alcohol abuse   . Asthmatic bronchitis   . Pleurisy   . GERD (gastroesophageal reflux disease)   . Diverticulosis     Colonoscopy 2007/Dr. Leone Payor  . Hx of colonic polyp   . H/O hiatal hernia   . Blood transfusion   . Anemia   . 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 Arthritis   . Anxiety   . Shortness of breath   . Coronary artery disease     sees Dr. Eden Emms, saw 1 month ago  . COPD (chronic obstructive pulmonary disease)     sees Dr/ Delford Field, saw last Dec 2012   Past Surgical History:  Past Surgical History  Procedure Date  . Tonsillectomy   . Tonsillectomy   . Cataracts     bilateral  . Kidney stone surgery     removal of kidney stone  . Cardiac catheterization     Friday 4, 2013    PT Assessment/Plan/Recommendation PT Assessment Clinical Impression Statement: Pt s/p CABG with significant decline in function due to decr cognitive status and weakness.  Will benefit from PT to maximize safety and independence prior to ultimate d/c home with wife's support.  May need post-acute venue for therapies prior to returning home.  Will continue to assess needs as mobility and cognition progress. PT Recommendation/Assessment: Patient will need skilled  PT in the acute care venue PT Problem List: Decreased strength;Decreased activity tolerance;Decreased mobility;Decreased cognition;Decreased knowledge of use of DME;Decreased knowledge of precautions Barriers to Discharge: None PT Therapy Diagnosis : Difficulty walking;Generalized weakness PT Plan PT Frequency: Min 3X/week PT Treatment/Interventions: DME instruction;Gait training;Functional mobility training;Therapeutic activities;Therapeutic exercise;Cognitive remediation;Patient/family education PT Recommendation Recommendations for Other Services: OT consult Follow Up Recommendations: Skilled nursing facility Equipment Recommended: Defer to next venue PT Goals  Acute Rehab PT Goals PT Goal Formulation: With family Time For Goal Achievement: 2 weeks Pt will Roll Supine to Left Side: with min assist PT Goal: Rolling Supine to Left Side - Progress: Not met Pt will go Supine/Side to Sit: with min assist PT Goal: Supine/Side to Sit - Progress: Not met Pt will Sit at [facility] of Bed: with supervision;with no upper extremity support;1-2 min PT Goal: Sit at Edge Of Bed - Progress: Not met Pt will go Sit to Supine/Side: with min assist PT Goal: Sit to Supine/Side - Progress: Not met Pt will go Sit to Stand: with mod assist;without upper extremity assist PT Goal: Sit to Stand - Progress: Not met Pt will go Stand to Sit: with min assist;without upper extremity assist PT Goal: Stand to Sit - Progress: Not met Pt will Ambulate: 16 - 50 feet;with min assist;with least restrictive assistive device PT Goal: Ambulate -  Progress: Not met Pt will Perform Home Exercise Program: with supervision, verbal cues required/provided PT Goal: Perform Home Exercise Program - Progress: Not met  PT Evaluation Precautions/Restrictions  Precautions Precautions: Sternal;Fall Precaution Comments: Pt unable to understand sternal precautions due to confusion. Repeatedly trying to push on Rt armrest with RUE (family  reports he does this in his recliner at home). Restrictions Weight Bearing Restrictions: No Prior Functioning  Home Living Lives With: Spouse Type of Home: Apartment (handicapped accessible apt) Home Layout: One level Home Access: Level entry Bathroom Shower/Tub: Health visitor: Handicapped height Bathroom Accessibility: Yes How Accessible: Accessible via walker Home Adaptive Equipment: Built-in shower seat;Grab bars around toilet;Grab bars in shower;Straight cane;Walker - four wheeled Additional Comments: After hospitalization in 7/12, pt required rollator (including using for seated rest breaks). Had progressed back to using cane and walking around apt complex with wife for exercise. Prior Function Level of Independence: Independent with basic ADLs;Independent with gait;Requires assistive device for independence (uses cane at all times) Cognition Cognition Arousal/Alertness: Awake/alert Overall Cognitive Status: Impaired Attention: Impaired Current Attention Level: Sustained Memory: Decreased recall of precautions Orientation Level: Oriented to person;Disoriented to place;Disoriented to time;Disoriented to situation (unable to recall location or situation immed p discussed) Following Commands: Follows one step commands inconsistently Cognition - Other Comments: requires verbal, visual, and tactile cues to follow simple commands Sensation/Coordination Sensation Light Touch: Not tested Extremity Assessment RLE Assessment RLE Assessment: Exceptions to [facility] RLE AROM (degrees) Overall AROM Right Lower Extremity: Within functional limits for tasks assessed RLE Overall AROM Comments: knee extension 3/5 with max cues to stay on task; ankle DF 3/5 LLE Assessment LLE Assessment: Exceptions to WFL LLE AROM (degrees) Overall AROM Left Lower Extremity: Within functional limits for tasks assessed LLE Overall AROM Comments: knee extension 3/5 with max cues to stay on task;  ankle DF 3/5 Mobility (including Balance) Bed Mobility Bed Mobility: No (up in chair on arrival) Transfers Transfers: No (RN reports +2 male RNs to pivot;unable to stand with RN & MD)    Exercise  General Exercises - Lower Extremity Ankle Circles/Pumps: AROM;Both;10 reps;Seated Long Arc Quad: AROM;Both;10 reps;Seated End of Session PT - End of Session Activity Tolerance: Other (comment) (limited by confusion and decr following commands) Patient left: in chair;with family/visitor present (pt unable to scoot back in chair with max A with ant wt-shif) Nurse Communication: Need for lift equipment General Behavior During Session: Flat affect  Raia Amico 04/02/2011, 11:31 AM Pager [phone]

## 2011-04-02 NOTE — Op Note (Signed)
NAMETERRENCE, WISHON            ACCOUNT NO.:  [id number]  MEDICAL RECORD NO.:  [id number]  LOCATION:  2311                         FACILITY:  [facility]  PHYSICIAN:  Evelene Croon, M.D.     DATE OF BIRTH:  Jun 24, 1930  DATE OF PROCEDURE:  03/31/2011 DATE OF DISCHARGE:                              OPERATIVE REPORT   PREOPERATIVE DIAGNOSIS:  Severe two-vessel coronary artery disease with unstable angina  POSTOPERATIVE DIAGNOSIS:  Severe two-vessel coronary artery disease with unstable angina.  OPERATIVE PROCEDURE:  Median sternotomy, extracorporeal circulation, coronary bypass graft surgery x3 using a left internal mammary artery graft to the left anterior descending coronary artery, with a sequential saphenous vein graft to the distal right coronary artery, and then the posterior descending coronary artery.  Endoscopic vein harvesting from the right leg.  ATTENDING SURGEON:  Evelene Croon, M.D.  ASSISTANT:  Al Corpus, [place].  CLINICAL HISTORY:  This patient is a 78 year old gentleman with a history of significant pulmonary problems started last summer around July with bronchiectasis and repeat pulmonary infections, one of which required a 9-day hospital stay.  He was treated with steroids for postinflammatory pulmonary fibrosis and was sent home on oxygen which was subsequently weaned off.  A cardiac workup was done since the patient continued to have episodes of shortness of breath associated with substernal chest discomfort.  These episodes have been occurring with less and less exertion and some at rest.  He has also had exertional fatigue.  Cardiac catheterizations on February 02, 2011, showed significant 2 vessel disease with normal left ventricular function.  The left main coronary artery was fairly smooth and normal. The LAD had a proximal 70% stenosis and then 80-90% midvessel stenosis. There was a terminal 80-90% stenosis just before the bifurcation at the apex.  The  first diagonal was fairly small branch.  Left circumflex had a large obtuse marginal branch that had no disease in it.  The left circumflex was subtotally occluded just beyond this marginal branch with the more distal left circumflex filling slowly.  This is a relatively small distal vessel.  The right coronary artery was severely and diffusely diseased with 80-90% stenosis in the proximal vessel, mid vessel, and distally.  There was a 90% stenosis just prior to the bifurcation of the posterior descending artery, and a tight 90% stenosis in the mid distal posterior descending artery itself.  Both the posterior descending and posterolateral branches were relatively small vessels, although the posterior descending looked graftable.  Left ventricular function was normal with ejection fraction of 60%.  After review of the catheterization and examination, the patient was felt that coronary artery bypass graft surgery is the best treatment to prevent further ischemia and infarction.  I discussed the operative procedure with the patient and his family including alternatives, benefits, and risks including, but not limited to bleeding, blood transfusion, infection, stroke, myocardial infarction, graft failure, and death.  He understood all this and agreed to proceed.  OPERATIVE PROCEDURE:  The patient was taken to the operative room, placed on the table in supine position.  After induction of general endotracheal anesthesia, a Foley catheter was placed in bladder using sterile technique.  Then, the chest, abdomen, and  both lower extremities were prepped and draped in the usual sterile manner.  The chest was entered through a median sternotomy incision and the pericardium was opened in midline.  Examination of the heart showed good ventricular contractility.  The ascending aorta had no palpable plaques in it.  Then, the left internal mammary artery was harvested from chest wall as a pedicle  graft.  This was a medium caliber vessel with excellent blood flow through it.  At the same time, a segment of greater saphenous vein was harvested from the right leg using endoscopic vein harvest technique.  This vein was of medium size and good quality.  Then, the patient was heparinized when adequate ACT was obtained, the distal ascending aorta was cannulated using a 20-French aortic cannula for arterial inflow.  Venous outflow was achieved using a two-stage venous cannula for the right atrial appendage.  An antegrade cardioplegia and vent cannula was inserted in aortic root.  The patient was placed on cardiopulmonary bypass and distal coronary artery was identified.  The LAD was heavily diseased in its proximal portion.  The mid to distal vessel was suitable for grafting.  The diagonal was a small vessel, it is lying deep in the myocardial fat and was not graftable.  The obtuse marginal was a large vessel that had no significant disease in it.  The distal left circumflex beyond the obtuse marginal was a small nongraftable vessel.  The right coronary artery was diffusely diseased.  I traced this out just beyond the takeoff of the posterior descending branch, and this area was fairly soft but a little difficult to expose.  I felt that it would be graftable here to supply the posterolateral branch which was a fairly small branch.  It was not graftable itself.  The posterior descending was diseased in its proximal portion.  It was relatively small distally but still graftable.  Then, the aorta was crossclamped and 1000 mL of cold blood antegrade cardioplegia was administered in the aortic root with quick arrest of the heart.  Systemic hypothermia to 32 degrees centigrade and topical hypothermic ice saline was used.  A temperature probe was placed in septum insulating pad in the pericardium.  The first distal anastomosis was performed to the distal right coronary artery just beyond the  takeoff of the posterior descending branch.  The internal diameter here was about 1.75 mm.  The conduit used was a segment of greater saphenous vein, and the anastomosis performed in a sequential side-to-side manner using continuous 7-0 Prolene suture. Flow was noted through the graft and was excellent.  The second distal anastomosis was performed with the posterior descending artery distally.  The internal diameter was about 1.5-1.6 mm distally.  The conduit used was the second segment of greater saphenous vein, and the anastomosis performed in a sequential end-to-side manner using continuous 7-0 Prolene suture.  Flow was noted through the graft, it was excellent.  Then, a dose of cardioplegia was given down the vein graft and aortic root.  Third distal anastomosis was performed to the mid to distal portion of the left anterior descending coronary artery.  The internal diameter of this vessel at this location was 1.75 mm.  Conduit used was the left internal mammary graft, this brought through an opening of left pericardium, anterior phrenic nerve.  It was anastomosed to the LAD in an end-to-side manner using continuous 8-0 Prolene suture.  The pedicle was sutured to the epicardium 6-0 Prolene sutures.  The patient rewarmed to 37  degrees centigrade.  With the crossclamp in place, the single proximal vein graft anastomosis was performed in the mid ascending aorta in an end-to-side manner using continuous 6-0 Prolene suture.  Then, the clamp was moved to mammary pedicle.  There was rapid warming of the ventricular septum and return of spontaneous ventricular fibrillation. Crossclamp was removed with the time of 69 minutes, and the patient spontaneously converted to sinus rhythm.  The proximal and distal anastomoses appeared to be hemostatic and allowed the grafts satisfactory.  Graft marker was placed around the proximal anastomosis. Two temporary right ventricular and right atrial pacing  wires were placed above the skin.  When the patient was rewarmed to 37 degrees centigrade, he was weaned from cardiopulmonary bypass on no inotropic agents.  Total bypass time was 83 minutes.  Cardiac function appeared excellent with a cardiac output of 5 L/minute.  Protamine was given, and the venous and aortic cannulas were removed without difficulty.  Hemostasis was achieved.  The patient was given 1 unit of platelets due to platelet count of 90,000. After obtaining hemostasis, 3 chest tubes were placed with two in the posterior pericardium, one left pleural space, one in anterior mediastinum.  Sternum was closed with double #6 stainless steel wires. Fascia was closed with continuous #1 Vicryl suture.  Subcutaneous tissue was closed with continuous 2-0 Vicryl, and the skin with a 3-0 Vicryl subcuticular closure.  After closing the chest, it was noted that the patient had some hematoma formation in the right leg saphenous vein harvest tunnel, and it was brisk oozing from the upper thigh stab incision.  I was concerned that a small arterial branch may have been nicked when the vein was divided.  Therefore, the incision in the upper thigh was extended and carried down through the subcutaneous tissue using electrocautery.  There was a large amount of bright red blood within the tunnel and this was suctioned.  I looked down the tunnel as far as I could see, and I did not see any specific bleeding site, but there was still blood coming from below.  Therefore, the knee incision was opened and hematoma evacuated.  Examination above and below this incision showed that the bleeding appeared to be coming from the tunnel below this and the incision, and therefore I had to make another incision in the lower leg to explore the tunnel.  I did find a bleeding site to be a small perforating arterial branch coming through the fascia that must have been divided when the stabbing grab was performed  to divide the lower end of the vein.  This arterial branch was very small but bleeding briskly.  It was clipped and complete hemostasis was obtained.  Subcutaneous tissue of all 3 of these lower extremity wounds was closed with continuous 2-0 Vicryl suture.  The skin was closed with staples since it was fairly thin and I was concerned about subcuticular sutures not holding.  The sponge, needle, instrument counts were correct according to scrub nurse.  Dry sterile dressing applied over the incisions around the chest tubes, which were Pleur-Evac suction.  The patient remained hemodynamically stable and transferred to the SICU in guarded but stable condition.     Evelene Croon, M.D.     BB/MEDQ  D:  03/31/2011  T:  04/02/2011  Job:  [id number]

## 2011-04-02 NOTE — Progress Notes (Signed)
2 Days Post-Op Procedure(s) (LRB): CORONARY ARTERY BYPASS GRAFTING (CABG) (N/A) Subjective:                                       [address]            [place] [address]          [phone]     OOB to chair,confused, some swallow problems,some L arm weakness Controlled A-Fib Anemic Hb7g CXR no infiltrate Slept w/ .5 ativan but drowsy today Objective: Vital signs in last 24 hours: Temp:  [97.7 F (36.5 C)-99.5 F (37.5 C)] 98.2 F (36.8 C) (01/13 0759) Pulse Rate:  [81-122] 102  (01/13 0800) Cardiac Rhythm:  [-] Atrial fibrillation (01/13 0800) Resp:  [13-25] 18  (01/13 0800) BP: (94-123)/(41-59) 114/53 mmHg (01/13 0800) SpO2:  [89 %-100 %] 91 % (01/13 0800) Arterial Line BP: (97)/(46) 97/46 mmHg (01/12 1100) Weight:  [211 lb 3.2 oz (95.8 kg)] 211 lb 3.2 oz (95.8 kg) (01/13 0600)  Hemodynamic parameters for last 24 hours: PAP: (37)/(13) 37/13 mmHg  Intake/Output from previous day: 01/12 0701 - 01/13 0700 In: 700 [P.O.:360; I.V.:340] Out: 2350 [Urine:1550; Chest Tube:800] Intake/Output this shift: Total I/O In: -  Out: 190 [Urine:130; Chest Tube:60]    Lab Results:  Basename 04/02/11 0350 04/01/11 1640  WBC 10.3 12.5*  HGB 7.5* 8.6*  HCT 22.2* 25.7*  PLT 128* 153   BMET:  Basename 04/02/11 0350 04/01/11 1640 04/01/11 1638 04/01/11 0404  NA 139 -- 144 --  K 3.6 -- 3.9 --  CL 107 -- 107 --  CO2 27 -- -- 23  GLUCOSE 135* -- 123* --  BUN 15 -- 12 --  CREATININE 0.76 0.74 -- --  CALCIUM 8.0* -- -- 7.2*    PT/INR:  Basename 03/31/11 2045  LABPROT 18.6*  INR 1.52*   ABG    Component Value Date/Time   PHART 7.374 04/01/2011 0358   HCO3 24.9* 04/01/2011 0358   TCO2 24 04/01/2011 1638   ACIDBASEDEF 1.0 03/31/2011 2034   O2SAT 99.0 04/01/2011 0358   CBG (last 3)   Basename 04/02/11 0757 04/02/11 0340 04/01/11 1958  GLUCAP 132* 144* 136*    Assessment/Plan: S/P Procedure(s) (LRB): CORONARY ARTERY BYPASS GRAFTING (CABG) (N/A)    PLAN  Transfuse 1 PRBC NPO until SLT except meds Stop amio may be causing AMS,stop hs ativan (home med) Screening head CT PT has been consulted  LOS: 2 days    VAN TRIGT III,Jaynee Winters 04/02/2011

## 2011-04-02 NOTE — Progress Notes (Signed)
Speech Language/Pathology Clinical/Bedside Swallow Evaluation Patient Details  Name: Matthew Bridges MRN: [id number] DOB: 1930/07/19 Today's Date: 04/02/2011  Past Medical History:  Past Medical History  Diagnosis Date  . Bronchiectasis   . Nephrolithiasis   . Pneumonia   . Hyperlipidemia   . Insomnia   . Alcohol abuse   . Asthmatic bronchitis   . Pleurisy   . GERD (gastroesophageal reflux disease)   . Diverticulosis     Colonoscopy 2007/Dr. Leone Payor  . Hx of colonic polyp   . H/O hiatal hernia   . Blood transfusion   . Anemia   . 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 Arthritis   . Anxiety   . Shortness of breath   . Coronary artery disease     sees Dr. Eden Emms, saw 1 month ago  . COPD (chronic obstructive pulmonary disease)     sees Dr/ Delford Field, saw last Dec 2012   Past Surgical History:  Past Surgical History  Procedure Date  . Tonsillectomy   . Tonsillectomy   . Cataracts     bilateral  . Kidney stone surgery     removal of kidney stone  . Cardiac catheterization     Friday 4, 2013   HPI:  78 y/o male s/p CABG x3 . Nursing reports cognitive decline since am with difficulty completing am meal due to oral holding and inability to swallow medication. Patient's son present throughout evaluation and denied prior reports of dysphagia. Patient's spouse present during last 5 minutes of evaluation and stated that patient could not swallow his "grits" this morning . No prior  BSE or MBS  completed per spouse.    Assessment/Recommendations/Treatment Plan    SLP Assessment Clinical Impression Statement:  Oropharyngeal swallow intact but secondary to noted cognitive deficits with immediate agitation when given cues to attempt to complete compensatory strategies recommend continued NPO status and reassess on 04-03-11 for possible PO readiness. Patient observed to hold all Po trials even with max verbal cues to complete oral prep stage.  PO trial of regular  consistency cleared out of ora cavity with swab by SLP as patient unable to follow directions to expectorate cracker. Aspiration risk high with PO intake at this time  secondary to decreased safety awareness.  Other Related Risk Factors: History of GERD;History of pneumonia;Cognitive impairment;Decreased respiratory status  Swallow Recommendations General Recommendation: NPO  Treatment Plan Speech Therapy Frequency: min 2x/week Treatment Duration: 2 weeks Interventions: Aspiration precaution training;Patient/family education;Compensatory techniques  Prognosis Barriers to Reach Goals: Cognitive deficits;Behavior  Individuals Consulted Consulted and Agree with Results and Recommendations: Family member/caregiver;RN;Patient  Swallowing Goals  SLP Swallowing Goals Goal #3: Patient will consume PO trials of various consistencies administered by SLP only with no observed clinical signs of aspiration with moderate assistance.   Swallow Study Prior Functional Status     General  Date of Onset: 04/01/11 HPI: 78 y/o male s/p CABG x3 . Nursing reports cognitive decline since am with difficulty completing am meal due to oral holding and inability to swallow medication. Patient's son present throughout evaluation and denied prior reports of dysphagia. Patient's spouse present during last 5 minutes of evaluation and stated that patient could not swallow his "grits" this morning . No prior  BSE or MBS  completed per spouse.  Type of Study: Bedside swallow evaluation Diet Prior to this Study: NPO Respiratory Status: Supplemental O2 delivered via (comment) History of Intubation: Yes Length of Intubations (days): 1 days Date extubated: 04/01/11  Behavior/Cognition: Agitated;Uncooperative;Distractible;Requires cueing;Doesn't follow directions Oral Cavity - Dentition: Adequate natural dentition Vision: Functional for self-feeding Patient Positioning: Upright in bed Volitional Cough: Cognitively  unable to elicit Volitional Swallow: Unable to elicit Ice chips: Tested (comment)  Oral Motor/Sensory Function  Labial ROM: Reduced right Labial Symmetry: Abnormal symmetry right Labial Strength: Reduced Labial Sensation: Reduced Lingual ROM: Reduced right Lingual Symmetry: Abnormal symmetry right Lingual Strength: Reduced Lingual Sensation: Reduced Facial ROM: Reduced right Facial Symmetry: Right droop Facial Strength: Reduced Facial Sensation: Reduced Velum: Within Functional Limits Mandible: Within Functional Limits  Consistency Results  Ice Chips Ice chips: Impaired Oral Phase Impairments: Impaired anterior to posterior transit;Poor awareness of bolus Oral Phase Functional Implications: Oral holding Pharyngeal Phase Impairments: Delayed Swallow;Decreased hyoid-laryngeal movement;Throat Clearing - Immediate Other Comments: Inconsistent oral prep stage as patient unable to follow verbal cues to initiate mastication  but eventually began chewing.   Thin Liquid Thin Liquid: Impaired Oral Phase Impairments: Poor awareness of bolus;Impaired anterior to posterior transit Oral Phase Functional Implications: Oral holding Pharyngeal  Phase Impairments: Delayed Swallow;Throat Clearing - Delayed  Nectar Thick Liquid Nectar Thick Liquid: Not tested  Honey Thick Liquid Honey Thick Liquid: Impaired Presentation: Cup Oral Phase Impairments: Poor awareness of bolus;Impaired anterior to posterior transit Oral Phase Functional Implications: Oral holding Pharyngeal Phase Impairments: Delayed Swallow Other Comments:  Patient refused further trials. "I don't like it"  Puree Puree: Impaired Oral Phase Impairments: Poor awareness of bolus Oral Phase Functional Implications: Oral holding Pharyngeal Phase Impairments: Delayed Swallow  Solid Solid: Impaired Oral Phase Impairments: Poor awareness of bolus;Impaired anterior to posterior transit;Reduced lingual movement/coordination Oral  Phase Functional Implications: Right lateral sulci pocketing;Left lateral sulci pocketing;Oral residue;Oral holding;Other (comment) (eventually cleared by SLP with swab) Pharyngeal Phase Impairments: Delayed Joneen Caraway M.S., CCC-SLP [phone]  [facility] 04/02/2011,6:24 PM

## 2011-04-02 NOTE — Progress Notes (Signed)
Patient examined and record reviewed.Hemodynamics stable,labs satisfactory.Patient had stable day.Continue current care.  The patient continues to have some confusion. A CT of the head showed no acute change with an old right-sided lacunar stroke. Amiodarone has been stopped and this evening the patient is in a sinus rhythm. The patient was transfused one unit of packed cells for a hemoglobin of 7.5 and altered mental status. Followup hemoglobin in a.m. Attempted speech swallow study was not completed due to the patient's refusal to swallow. We will keep him n.p.o. except for medications and applesauce. Will place the patient on low dose maintenance IV fluids.      VAN TRIGT III,Arlenis Blaydes 04/02/2011

## 2011-04-02 NOTE — Plan of Care (Signed)
Problem: Phase II Progression Outcomes Goal: Patient extubated within - Outcome: Completed/Met Date Met:  04/02/11 6.5 hrs to extubate

## 2011-04-03 ENCOUNTER — Inpatient Hospital Stay (HOSPITAL_COMMUNITY): Payer: Medicare Other

## 2011-04-03 ENCOUNTER — Encounter (HOSPITAL_COMMUNITY): Payer: Self-pay | Admitting: *Deleted

## 2011-04-03 LAB — BASIC METABOLIC PANEL
BUN: 18 mg/dL (ref 6–23)
CO2: 27 mEq/L (ref 19–32)
CO2: 27 mEq/L (ref 19–32)
Calcium: 8.1 mg/dL — ABNORMAL LOW (ref 8.4–10.5)
Calcium: 8.4 mg/dL (ref 8.4–10.5)
Chloride: 106 mEq/L (ref 96–112)
Creatinine, Ser: 0.67 mg/dL (ref 0.50–1.35)
Creatinine, Ser: 0.68 mg/dL (ref 0.50–1.35)
GFR calc Af Amer: >90 mL/min (ref >90)
GFR calc non Af Amer: 90 mL/min — ABNORMAL LOW (ref >90)
GFR calc non Af Amer: 89 mL/min — ABNORMAL LOW (ref >90)
Glucose, Bld: 106 mg/dL — ABNORMAL HIGH (ref 70–99)
Glucose, Bld: 104 mg/dL — ABNORMAL HIGH (ref 70–99)
Potassium: 3.3 mEq/L — ABNORMAL LOW (ref 3.5–5.1)
Sodium: 140 mEq/L (ref 135–145)
Sodium: 141 mEq/L (ref 135–145)

## 2011-04-03 LAB — CBC
HCT: 24.4 % — ABNORMAL LOW (ref 39.0–52.0)
Hemoglobin: 8.3 g/dL — ABNORMAL LOW (ref 13.0–17.0)
MCH: 30.4 pg (ref 26.0–34.0)
MCHC: 34 g/dL (ref 30.0–36.0)
MCV: 89.4 fL (ref 78.0–100.0)
Platelets: 123 10*3/uL — ABNORMAL LOW (ref 150–400)
RBC: 2.73 MIL/uL — ABNORMAL LOW (ref 4.22–5.81)
RDW: 15.3 % (ref 11.5–15.5)
WBC: 12.1 10*3/uL — ABNORMAL HIGH (ref 4.0–10.5)

## 2011-04-03 LAB — GLUCOSE, CAPILLARY
Glucose-Capillary: 106 mg/dL — ABNORMAL HIGH (ref 70–99)
Glucose-Capillary: 108 mg/dL — ABNORMAL HIGH (ref 70–99)

## 2011-04-03 MED ORDER — FUROSEMIDE 10 MG/ML IJ SOLN
40.0000 mg | Freq: Once | INTRAMUSCULAR | Status: AC
  Administered 2011-04-03: 40 mg via INTRAVENOUS
  Filled 2011-04-03: qty 4

## 2011-04-03 MED ORDER — POTASSIUM CHLORIDE 10 MEQ/50ML IV SOLN
10.0000 meq | INTRAVENOUS | Status: DC | PRN
  Administered 2011-04-03: 10 meq via INTRAVENOUS

## 2011-04-03 MED ORDER — POTASSIUM CHLORIDE 10 MEQ/50ML IV SOLN
INTRAVENOUS | Status: AC
  Administered 2011-04-03: 10 meq via INTRAVENOUS
  Filled 2011-04-03: qty 150

## 2011-04-03 MED ORDER — HALOPERIDOL LACTATE 2 MG/ML PO CONC: 2.0000 mg | Freq: Four times a day (QID) | ORAL | Status: DC | PRN

## 2011-04-03 MED ORDER — POTASSIUM CHLORIDE 10 MEQ/100ML IV SOLN: 10.0000 meq | INTRAVENOUS | Status: DC

## 2011-04-03 MED ORDER — POTASSIUM CHLORIDE 10 MEQ/50ML IV SOLN
10.0000 meq | INTRAVENOUS | Status: AC
  Administered 2011-04-03 (×3): 10 meq via INTRAVENOUS
  Filled 2011-04-03 (×5): qty 50

## 2011-04-03 MED ORDER — HALOPERIDOL LACTATE 5 MG/ML IJ SOLN
INTRAMUSCULAR | Status: AC
  Administered 2011-04-03: 5 mg via INTRAVENOUS
  Filled 2011-04-03: qty 1

## 2011-04-03 MED ORDER — HALOPERIDOL 2 MG PO TABS
2.0000 mg | ORAL_TABLET | Freq: Four times a day (QID) | ORAL | Status: DC | PRN
  Filled 2011-04-03: qty 1

## 2011-04-03 MED ORDER — POTASSIUM CHLORIDE 10 MEQ/50ML IV SOLN
10.0000 meq | INTRAVENOUS | Status: AC
  Administered 2011-04-03 (×2): 10 meq via INTRAVENOUS

## 2011-04-03 MED ORDER — HALOPERIDOL LACTATE 5 MG/ML IJ SOLN: 2.0000 mg | Freq: Four times a day (QID) | INTRAMUSCULAR | Status: DC | PRN

## 2011-04-03 MED ORDER — HALOPERIDOL LACTATE 5 MG/ML IJ SOLN
5.0000 mg | Freq: Once | INTRAMUSCULAR | Status: AC
  Administered 2011-04-03: 5 mg via INTRAVENOUS

## 2011-04-03 MED FILL — Magnesium Sulfate Inj 50%: INTRAMUSCULAR | Qty: 10 | Status: AC

## 2011-04-03 MED FILL — Potassium Chloride Inj 2 mEq/ML: INTRAVENOUS | Qty: 40 | Status: AC

## 2011-04-03 NOTE — Clinical Documentation Improvement (Signed)
CHANGE MENTAL STATUS DOCUMENTATION CLARIFICATION   THIS DOCUMENT IS NOT A PERMANENT PART OF THE MEDICAL RECORD  TO RESPOND TO THE THIS QUERY, FOLLOW THE INSTRUCTIONS BELOW:  1. If needed, update documentation for the patient's encounter via the notes activity.  2. Access this query again and click edit on the In [employer].  3. After updating, or not, click F2 to complete all highlighted (required) fields concerning your review. Select "additional documentation in the medical record" OR "no additional documentation provided".  4. Click Sign note button.  5. The deficiency will fall out of your In Basket *Please let us know if you are not able to complete this workflow by phone or e-mail (listed below).         04/03/11  Dear Dr. Laneta Simmers Marton Redwood  In an effort to better capture your patient's severity of illness, reflect appropriate length of stay and utilization of resources, a review of the patient medical record has revealed the following indicators.    Based on your clinical judgment, please clarify and document in a progress note and/or discharge summary the clinical condition associated with the following supporting information:    Possible Clinical Conditions?  _______Encephalopathy (describe type if known)                       Anoxic                       Septic                       Alcoholic                        Hepatic                       Hypertensive                       Metabolic                       Toxic  _______Drug induced confusion/delirium _______Acute confusion _______Acute delirium _______Acute exacerbation of known dementia (indicate type) _______New diagnosis of Dementia, Alzheimer's, cerebral atherosclerosis _______Other Condition__________________ _______Cannot Clinically Determine   Supporting Information:  Signs & Symptoms:  Per 1/13 progress note:  Stop amio, may be causing AMS, stop ativan.  Reviewed:  Reviewed and answered in the  progress notes.  Thank You,  Marciano Sequin,  Clinical Documentation Specialist:  Pager: [phone]  Health Information Management [place]

## 2011-04-03 NOTE — Progress Notes (Signed)
3 Days Post-Op Procedure(s) (LRB): CORONARY ARTERY BYPASS GRAFTING (CABG) (N/A)  Subjective: Feels bad but nonspecific  Objective: Vital signs in last 24 hours: Temp:  [97.8 F (36.6 C)-99.2 F (37.3 C)] 98.2 F (36.8 C) (01/14 0753) Pulse Rate:  [65-109] 79  (01/14 0700) Cardiac Rhythm:  [-] Normal sinus rhythm (01/14 0740) Resp:  [15-22] 20  (01/14 0700) BP: (93-136)/(41-103) 121/62 mmHg (01/14 0700) SpO2:  [88 %-100 %] 99 % (01/14 0700) Weight:  [97 kg (213 lb 13.5 oz)] 97 kg (213 lb 13.5 oz) (01/14 0353)  Hemodynamic parameters for last 24 hours:    Intake/Output from previous day: 01/13 0701 - 01/14 0700 In: 645.5 [P.O.:180; I.V.:103; Blood:362.5] Out: 2725 [Urine:2405; Chest Tube:320] Intake/Output this shift: Total I/O In: 50 [IV Piggyback:50] Out: 60 [Urine:60]  General appearance: cooperative Neurologic: intact Heart: regular rate and rhythm, S1, S2 normal, no murmur, click, rub or gallop Lungs: diminished breath sounds bibasilar Extremities: edema mild Wound: incisions ok  Lab Results:  Basename 04/03/11 0400 04/02/11 0350  WBC 12.1* 10.3  HGB 8.3* 7.5*  HCT 24.4* 22.2*  PLT 123* 128*   BMET:  Basename 04/03/11 0400 04/02/11 0350  NA 140 139  K 3.3* 3.6  CL 106 107  CO2 27 27  GLUCOSE 106* 135*  BUN 18 15  CREATININE 0.67 0.76  CALCIUM 8.1* 8.0*    PT/INR:  Basename 03/31/11 2045  LABPROT 18.6*  INR 1.52*   ABG    Component Value Date/Time   PHART 7.374 04/01/2011 0358   HCO3 24.9* 04/01/2011 0358   TCO2 24 04/01/2011 1638   ACIDBASEDEF 1.0 03/31/2011 2034   O2SAT 99.0 04/01/2011 0358   CBG (last 3)   Basename 04/03/11 0752 04/03/11 0355 04/02/11 2316  GLUCAP 108* 106* 162*    Assessment/Plan: S/P Procedure(s) (LRB): CORONARY ARTERY BYPASS GRAFTING (CABG) (N/A) Mobilize Diuresis Continue PT. Will consult OT. Speech therapy swallowing eval   LOS: 3 days    BARTLE,BRYAN K 04/03/2011

## 2011-04-03 NOTE — Progress Notes (Signed)
Speech Language/Pathology Speech Pathology: Dysphagia Treatment Note  Patient was observed with : Mechanical Soft / Ground and Thin liquids.  Patient was noted to have s/s of aspiration : Yes:  Cough with straw sip  Lung Sounds:  clear Temperature: afebrile  Pt's mentation much improved from yesterday. Pt accepted straw sip of water with cough x1, cup sips with timely swallow response, no cough or wet vocal quality. Accepted pills from RN in puree/pudding with no difficulty. Mastication of cracker prolonged with oral residue that pt required cues to clear.    Clinical Impression: Pt's dysphagia yesterday apparently secondary to altered mental status. Pt now with timely transit of liquids though still with mild struggle with solids due to weakness and poor sustained attention. Pt may initiate a dysphagia 2 finely chopped diet with thin liquids, following aspiration precautions. SLP will follow for tolerance.   Recommendations:  Dys2/thin, no straws. Pills whole in puree.   Pain:   none Intervention Required:   No   Goals: All Goals Met  Updated Goals: Pt will tolerate a Dys2 diet with thin liquids with no overt s/s of aspiration.  Pt will follow aspiration precautions/compensatory strategies with min verbal cues.   Kenshawn Maciolek, MA CCC-SLP [phone]    

## 2011-04-03 NOTE — Progress Notes (Signed)
**Note Matthew-Identified via Obfuscation** Physical Therapy Treatment Patient Details Name: Matthew Bridges MRN: [id number] DOB: Aug 28, 1930 Today's Date: 04/03/2011  PT Assessment/Plan  PT - Assessment/Plan Comments on Treatment Session: Pt did much better today. Able to ambulate 140 ft with minA. Family present throughout session and they are aware of seated exercises for HEP.  PT Plan: Discharge plan remains appropriate PT Goals  Acute Rehab PT Goals PT Goal: Rolling Supine to Left Side - Progress: Not met PT Goal: Supine/Side to Sit - Progress: Not met PT Goal: Sit at Edge Of Bed - Progress: Progressing toward goal PT Goal: Sit to Supine/Side - Progress: Not met PT Goal: Sit to Stand - Progress: Progressing toward goal PT Goal: Stand to Sit - Progress: Progressing toward goal Pt will Ambulate: with supervision;with least restrictive assistive device PT Goal: Ambulate - Progress: (Revised due to goal met) PT Goal: Perform Home Exercise Program - Progress: Progressing toward goal  PT Treatment Precautions/Restrictions  Precautions Precautions: Sternal;Fall Precaution Comments: Pt did better with sternal precautions today but unable to recall them independently and still needing continuous cueing to prevent him from using his UE during sit<->stand  Restrictions Weight Bearing Restrictions: No Mobility (including Balance) Bed Mobility Bed Mobility: No (pt in his recliner on presentation) Transfers Transfers: Yes Sit to Stand: 1: +2 Total assist;From chair/3-in-1;Without upper extremity assist (65%) Sit to Stand Details (indicate cue type and reason): +2totalpt65% with cueing to sequence, specifically for adherence to sternal precautions and follow through to stand with facilitation for anterior translation Stand to Sit: 1: +2 Total assist;To chair/3-in-1;Without upper extremity assist (50%) Stand to Sit Details: +2totalpt50%; pt very resistant to sitting without using his arms to reach back for chair; max verbal and tactile  cueing to sequence stand->sit; faciliation at hips to flex and faciliation to guide hips into chair Ambulation/Gait Ambulation/Gait: Yes Ambulation/Gait Assistance: 4: Min assist Ambulation/Gait Assistance Details (indicate cue type and reason): pt amb approx 140 ft pushing w/c with minA for self monitoring; chair followed pt and he kept complaining that he ws going to fall but pt did very welll, no obvious appearance of major unsteadiness when holding on to w/c handles Ambulation Distance (Feet): 140 Feet Gait Pattern: Trunk flexed;Shuffle  Posture/Postural Control Posture/Postural Control: No significant limitations Balance Balance Assessed: No Exercise  Total Joint Exercises Ankle Circles/Pumps: AROM;Both;10 reps;Seated Long Arc Quad: AAROM;Both;10 reps;Seated (pt with difficulty sequencing this exercise) End of Session PT - End of Session Equipment Utilized During Treatment: Gait belt Activity Tolerance: Patient tolerated treatment well Patient left: in chair;with call bell in reach;with family/visitor present Nurse Communication: Mobility status for transfers;Mobility status for ambulation General Behavior During Session: [facility] for tasks performed Cognition: Impaired Cognitive Impairment: better ability to follow commands today but still difficulty with problem solving, safety awareness and memory  [facility] HELEN 04/03/2011, 10:11 AM

## 2011-04-04 ENCOUNTER — Inpatient Hospital Stay (HOSPITAL_COMMUNITY): Payer: Medicare Other

## 2011-04-04 LAB — BASIC METABOLIC PANEL
BUN: 16 mg/dL (ref 6–23)
CO2: 26 mEq/L (ref 19–32)
Chloride: 106 mEq/L (ref 96–112)
GFR calc non Af Amer: 89 mL/min — ABNORMAL LOW (ref >90)
Glucose, Bld: 104 mg/dL — ABNORMAL HIGH (ref 70–99)
Potassium: 3.9 mEq/L (ref 3.5–5.1)
Sodium: 140 mEq/L (ref 135–145)

## 2011-04-04 LAB — GLUCOSE, CAPILLARY
Glucose-Capillary: 103 mg/dL — ABNORMAL HIGH (ref 70–99)
Glucose-Capillary: 98 mg/dL (ref 70–99)

## 2011-04-04 LAB — CBC
HCT: 24.1 % — ABNORMAL LOW (ref 39.0–52.0)
Hemoglobin: 8.2 g/dL — ABNORMAL LOW (ref 13.0–17.0)
MCHC: 34 g/dL (ref 30.0–36.0)
RBC: 2.69 MIL/uL — ABNORMAL LOW (ref 4.22–5.81)
WBC: 10.6 10*3/uL — ABNORMAL HIGH (ref 4.0–10.5)

## 2011-04-04 MED ORDER — TRAMADOL HCL 50 MG PO TABS
50.0000 mg | ORAL_TABLET | ORAL | Status: DC | PRN
  Administered 2011-04-04 (×3): 50 mg via ORAL
  Administered 2011-04-05: 100 mg via ORAL
  Administered 2011-04-07 – 2011-04-08 (×3): 50 mg via ORAL
  Administered 2011-04-09 – 2011-04-10 (×3): 100 mg via ORAL
  Filled 2011-04-04 (×3): qty 2
  Filled 2011-04-04 (×2): qty 1
  Filled 2011-04-04: qty 2
  Filled 2011-04-04 (×4): qty 1

## 2011-04-04 MED ORDER — ACETAMINOPHEN 325 MG PO TABS: 650.0000 mg | ORAL_TABLET | Freq: Four times a day (QID) | ORAL | Status: DC | PRN

## 2011-04-04 MED ORDER — SODIUM CHLORIDE 0.9 % IV SOLN: 250.0000 mL | INTRAVENOUS | Status: DC | PRN

## 2011-04-04 MED ORDER — ALBUTEROL SULFATE HFA 108 (90 BASE) MCG/ACT IN AERS
2.0000 | INHALATION_SPRAY | Freq: Four times a day (QID) | RESPIRATORY_TRACT | Status: DC | PRN
  Filled 2011-04-04: qty 6.7

## 2011-04-04 MED ORDER — POVIDONE-IODINE 10 % EX SOLN
1.0000 "application " | Freq: Two times a day (BID) | CUTANEOUS | Status: DC
  Administered 2011-04-04 – 2011-04-10 (×13): 1 via TOPICAL
  Filled 2011-04-04: qty 15

## 2011-04-04 MED ORDER — LORAZEPAM 1 MG PO TABS
1.0000 mg | ORAL_TABLET | Freq: Two times a day (BID) | ORAL | Status: DC
  Administered 2011-04-04 – 2011-04-10 (×13): 1 mg via ORAL
  Filled 2011-04-04 (×13): qty 1

## 2011-04-04 MED ORDER — MOVING RIGHT ALONG BOOK
Freq: Once | Status: AC
  Administered 2011-04-04: 13:00:00
  Filled 2011-04-04 (×2): qty 1

## 2011-04-04 MED ORDER — FERROUS GLUCONATE 324 (38 FE) MG PO TABS
324.0000 mg | ORAL_TABLET | Freq: Two times a day (BID) | ORAL | Status: DC
  Administered 2011-04-04 – 2011-04-10 (×13): 324 mg via ORAL
  Filled 2011-04-04 (×16): qty 1

## 2011-04-04 MED ORDER — SODIUM CHLORIDE 0.9 % IJ SOLN: 3.0000 mL | INTRAMUSCULAR | Status: DC | PRN

## 2011-04-04 MED ORDER — INSULIN ASPART 100 UNIT/ML ~~LOC~~ SOLN
0.0000 [IU] | Freq: Three times a day (TID) | SUBCUTANEOUS | Status: DC
  Administered 2011-04-04 – 2011-04-10 (×15): 2 [IU] via SUBCUTANEOUS
  Filled 2011-04-04 (×2): qty 3

## 2011-04-04 MED ORDER — SODIUM CHLORIDE 0.9 % IJ SOLN
3.0000 mL | Freq: Two times a day (BID) | INTRAMUSCULAR | Status: DC
  Administered 2011-04-04 – 2011-04-10 (×12): 3 mL via INTRAVENOUS

## 2011-04-04 MED FILL — Sodium Bicarbonate IV Soln 8.4%: INTRAVENOUS | Qty: 50 | Status: AC

## 2011-04-04 MED FILL — Electrolyte-R (PH 7.4) Solution: INTRAVENOUS | Qty: 6000 | Status: AC

## 2011-04-04 MED FILL — Heparin Sodium (Porcine) Inj 1000 Unit/ML: INTRAMUSCULAR | Qty: 40 | Status: AC

## 2011-04-04 MED FILL — Mannitol IV Soln 20%: INTRAVENOUS | Qty: 500 | Status: AC

## 2011-04-04 MED FILL — Sodium Chloride Irrigation Soln 0.9%: Qty: 3000 | Status: AC

## 2011-04-04 MED FILL — Lidocaine HCl IV Inj 20 MG/ML: INTRAVENOUS | Qty: 5 | Status: AC

## 2011-04-04 MED FILL — Sodium Chloride IV Soln 0.9%: INTRAVENOUS | Qty: 1000 | Status: AC

## 2011-04-04 NOTE — Progress Notes (Signed)
Physical Therapy Treatment Patient Details Name: Matthew Bridges MRN: [id number] DOB: 09-Jan-1931 Today's Date: 04/04/2011  PT Assessment/Plan  PT - Assessment/Plan Comments on Treatment Session: Pt continues to progress with therapy. Still continues to need a lot of reminders/cueing for sternal precautions during transfers. Wife reports they will take him home. She is aware that they will need to make surfaces higher for easier sit<->stand. Will need to progress bed mobility to a point where him and his wife can peform independently at home unless he wants to sleep in recliner. If pt goes home he will need HHPT. PT Plan: Discharge plan needs to be updated PT Frequency: Min 3X/week Follow Up Recommendations: Home health PT;Supervision/Assistance - 24 hour (if pt goes home he will need HHPT with 24 hour supervision) Equipment Recommended:  (TBD- may need shower seat?) PT Goals  Acute Rehab PT Goals Time For Goal Achievement: 2 weeks PT Goal: Rolling Supine to Left Side - Progress: Progressing toward goal PT Goal: Supine/Side to Sit - Progress: Progressing toward goal PT Goal: Sit at Edge Of Bed - Progress: Progressing toward goal PT Goal: Sit to Supine/Side - Progress: Progressing toward goal PT Goal: Sit to Stand - Progress: Progressing toward goal PT Goal: Stand to Sit - Progress: Progressing toward goal PT Goal: Ambulate - Progress: Progressing toward goal PT Goal: Perform Home Exercise Program - Progress: Progressing toward goal  PT Treatment Precautions/Restrictions  Precautions Precautions: Fall;Sternal Precaution Comments: Pt required Max cues to maintain sternal precautions Restrictions Weight Bearing Restrictions: No Mobility (including Balance) Bed Mobility Rolling Left: 1: +2 Total assist (30%) Rolling Left Details (indicate cue type and reason): +2totalpt30%; pt requires cueing for sequencing especially for rotation at hips to initiate roll without pulling on rail with  RUE Left Sidelying to Sit: 1: +2 Total assist Left Sidelying to Sit Details (indicate cue type and reason): +2totalpt30%; max cueing to prevent pushing with LUE; pt with POOR adherence to sternal precautions even with max cueing Sit to Sidelying Right: 1: +2 Total assist Sit to Sidelying Right Details (indicate cue type and reason): +2totalpt50%; pt holding onto cardiac pillow; assist to elevate legs to bed and to guide shoulders to bed; patient then able to roll with supervision onto his back  Transfers Sit to Stand: From bed;From toilet;Without upper extremity assist;1: +2 Total assist Sit to Stand Details (indicate cue type and reason): Sit->stand x2; first sit->stand from bed with elevated surface and pt able to stand minA while keeping his hands in his lap with cueing; second sit->stand was from toilet pt needing +2totalpt50% as from a lower surface, max cueing/faciliation to prevent pt from pulling up on RW or reaching behind to push up Stand to Sit Details: +2totalpt50% with max faciliation and verbal encouragement to flex at hips/knees into sitting on toilet as pt relies heavily on UE assist prior to surgery (pt has a lot more difficulty sitting on a lower surface like toilet); second stand->sit pt did much better as he ws able to feel the bed behind him and the surface was higher Ambulation/Gait Ambulation/Gait: Yes Ambulation/Gait Assistance: 4: Min assist Ambulation/Gait Assistance Details (indicate cue type and reason): amb minA with RW x140 ft; minA to several imbalances and cueing for safe technique with RW Ambulation Distance (Feet): 140 Feet Assistive device: Rolling walker Gait Pattern: Trunk flexed  Posture/Postural Control Posture/Postural Control: No significant limitations Balance Balance Assessed: No Exercise  Total Joint Exercises Ankle Circles/Pumps: AROM;Both;10 reps;Supine End of Session PT - End of Session Equipment Utilized  During Treatment: Gait belt Activity  Tolerance: Patient tolerated treatment well Patient left: in bed Nurse Communication: Mobility status for transfers;Mobility status for ambulation General Behavior During Session: [name] for tasks performed Cognition: Impaired  Bridges,Matthew Amend HELEN 04/04/2011, 1:58 PM

## 2011-04-04 NOTE — Significant Event (Signed)
Report call to 2000. Receiving RN unavailable at the time. Receiving RN states will call when able. Shawndrea Rutkowski, Charity fundraiser.

## 2011-04-04 NOTE — Plan of Care (Signed)
Problem: Phase III Progression Outcomes Goal: Transfer to PCTU/Telemetry POD Outcome: Progressing Pt to be transferred to 2017.

## 2011-04-04 NOTE — Progress Notes (Signed)
Occupational Therapy Evaluation Patient Details Name: Matthew Bridges MRN: [id number] DOB: [birth year] Today's Date: 04/04/2011  Problem List:  Patient Active Problem List  Diagnoses  . HYPERLIPIDEMIA, MILD  . Alcohol abuse, in remission  . Other chronic pain  . BRONCHIECTASIS  . COPD  . GERD  . DIVERTICULOSIS-COLON  . CONSTIPATION  . INSOMNIA  . PERSONAL HX COLONIC POLYPS  . HX OF GALLSTONE  . NEPHROLITHIASIS, HX OF  . Postinflammatory pulmonary fibrosis  . Preventative health care  . Epistaxis  . Anxiety  . Dizziness  . Chest pain on exertion    Past Medical History:  Past Medical History  Diagnosis Date  . Bronchiectasis   . Nephrolithiasis   . Pneumonia   . Hyperlipidemia   . Insomnia   . Alcohol abuse   . Asthmatic bronchitis   . Pleurisy   . GERD (gastroesophageal reflux disease)   . Diverticulosis     Colonoscopy 2007/Dr. Leone Payor  . Hx of colonic polyp   . H/O hiatal hernia   . Blood transfusion   . Anemia   . 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 Arthritis   . Anxiety   . Shortness of breath   . Coronary artery disease     sees Dr. Eden Emms, saw 1 month ago  . COPD (chronic obstructive pulmonary disease)     sees Dr/ Delford Field, saw last Dec 2012  . S/P CABG (coronary artery bypass graft)    Past Surgical History:  Past Surgical History  Procedure Date  . Tonsillectomy   . Tonsillectomy   . Cataracts     bilateral  . Kidney stone surgery     removal of kidney stone  . Cardiac catheterization     Friday 4, 2013  . Coronary artery bypass graft 03/31/2011    Procedure: CORONARY ARTERY BYPASS GRAFTING (CABG);  Surgeon: [name], MD;  Location: [facility] OR;  Service: Open Heart Surgery;  Laterality: N/A;  Times three using the mammary and right leg greater saphenous vein.     OT Assessment/Plan/Recommendation OT Assessment Clinical Impression Statement: Pt s/p CABG with significant decline in function due to decr cognitive  status and weakness. Will benefit from skilled OT in the acute setting to maximize I with ADL and ADL mobility to facilitate safe d/c home with wife as patient is very adamant about not d/c'ing to SNF, but returning to his home OT Recommendation/Assessment: Patient will need skilled OT in the acute care venue OT Problem List: Decreased strength;Decreased activity tolerance;Impaired balance (sitting and/or standing);Decreased safety awareness;Decreased knowledge of use of DME or AE;Decreased knowledge of precautions;Pain OT Therapy Diagnosis : Generalized weakness;Acute pain OT Plan OT Frequency: Min 2X/week OT Treatment/Interventions: Self-care/ADL training;DME and/or AE instruction;Therapeutic activities;Cognitive remediation/compensation;Patient/family education;Balance training OT Recommendation Follow Up Recommendations: Home health OT;Supervision/Assistance - 24 hour Equipment Recommended:  (TBD- may need shower seat?) Individuals Consulted Consulted and Agree with Results and Recommendations: Family member/caregiver;Patient Family Member Consulted: wife OT Goals Acute Rehab OT Goals OT Goal Formulation: With patient ADL Goals Pt Will Perform Grooming: with supervision;Standing at sink ADL Goal: Grooming - Progress: Not met Pt Will Perform Upper Body Dressing: with supervision;with set-up;Sitting, bed ADL Goal: Upper Body Dressing - Progress: Not met Pt Will Perform Lower Body Dressing: with min assist;Sit to stand from bed ADL Goal: Lower Body Dressing - Progress: Not met Pt Will Transfer to Toilet: with supervision;3-in-1;with DME (maintaining sternal precautions) ADL Goal: Toilet Transfer - Progress: Not met  Pt Will Perform Toileting - Hygiene: with supervision;Sit to stand from 3-in-1/toilet ADL Goal: Toileting - Hygiene - Progress: Not met Pt Will Perform Tub/Shower Transfer: with min assist;with DME;Ambulation ADL Goal: Tub/Shower Transfer - Progress: Not met Additional ADL  Goal #1: Pt will independently verbalize/generalize sternal precautions ADL Goal: Additional Goal #1 - Progress: Not met  OT Evaluation Precautions/Restrictions  Precautions Precautions: Sternal;Fall Precaution Comments: Pt required Max cues to maintain sternal precautions Restrictions Weight Bearing Restrictions: No Prior Functioning Home Living Lives With: Spouse Type of Home: Apartment Home Layout: One level Home Access: Level entry Bathroom Shower/Tub: Health visitor: Handicapped height Bathroom Accessibility: Yes How Accessible: Accessible via walker Home Adaptive Equipment: Built-in shower seat;Grab bars around toilet;Grab bars in shower;Straight cane;Walker - four wheeled Prior Function Level of Independence: Independent with basic ADLs;Independent with gait;Requires assistive device for independence ADL ADL Eating/Feeding: Simulated;Set up;Supervision/safety Where Assessed - Eating/Feeding: Edge of bed Grooming: Simulated;Minimal assistance Where Assessed - Grooming: Standing at sink Upper Body Bathing: Not assessed Lower Body Bathing: Not assessed Upper Body Dressing: Simulated;Moderate assistance Where Assessed - Upper Body Dressing: Sitting, bed Lower Body Dressing: Simulated;Maximal assistance Where Assessed - Lower Body Dressing: Sit to stand from bed Toilet Transfer: Performed;+2 Total assistance (pt=50% sit <-> stand) Toilet Transfer Details (indicate cue type and reason): Pt. required Max VC and tactile cues to maintain hands on knees/thighs for sit to stand and stand to sit in order to maintain sternal precautions. Min A with RW ambulation to/from toilet Toilet Transfer Method: Ambulating Toileting - Clothing Manipulation: Not assessed Toileting - Hygiene: Not assessed Tub/Shower Transfer: Not assessed Equipment Used: Rolling walker Ambulation Related to ADLs: Min A with RW ambulation with cues to stand within walker ADL Comments: Patient  greatly limited by inability to use UE for transfers (secondary to precautions) and need for constant cues to maintain this precaution. Vision/Perception  Vision - Assessment Vision Assessment: Vision not tested Cognition Cognition Arousal/Alertness: Awake/alert Current Attention Level: Sustained Orientation Level: Disoriented to situation;Disoriented to time;Disoriented to place;Oriented to person Following Commands: Follows one step commands inconsistently Safety/Judgement: Decreased awareness of safety precautions Sensation/Coordination Sensation Light Touch: Appears Intact Coordination Gross Motor Movements are Fluid and Coordinated: Yes Fine Motor Movements are Fluid and Coordinated: Yes Extremity Assessment RUE Assessment RUE Assessment: Within Functional Limits LUE Assessment LUE Assessment: Within Functional Limits Mobility  Bed Mobility Rolling Left: 1: +2 Total assist (pt=30%) Rolling Left Details (indicate cue type and reason): VC to bend leg and reach but not pull with RUE Left Sidelying to Sit: 1: +2 Total assist (pt=30%) Sit to Sidelying Right: 1: +2 Total assist (pt=50%; pt. given cardiac pillow to hug) End of Session OT - End of Session Equipment Utilized During Treatment: Gait belt Activity Tolerance: Patient tolerated treatment well Patient left: in bed;with call bell in reach;with family/visitor present Nurse Communication: Mobility status for transfers;Mobility status for ambulation   Artrell Lawless 04/04/2011, 12:27 PM

## 2011-04-04 NOTE — Progress Notes (Signed)
4 Days Post-Op Procedure(s) (LRB): CORONARY ARTERY BYPASS GRAFTING (CABG) (N/A) Subjective: No complaints  Objective: Vital signs in last 24 hours: Temp:  [97.8 F (36.6 C)-98.7 F (37.1 C)] 97.9 F (36.6 C) (01/15 0802) Pulse Rate:  [79-95] 79  (01/15 0700) Cardiac Rhythm:  [-] Normal sinus rhythm (01/15 0700) Resp:  [13-27] 15  (01/15 0700) BP: (93-129)/(42-66) 114/42 mmHg (01/15 0700) SpO2:  [91 %-100 %] 98 % (01/15 0700) Weight:  [91.1 kg (200 lb 13.4 oz)] 91.1 kg (200 lb 13.4 oz) (01/15 0600)   Some confusion overnight  Hemodynamic parameters for last 24 hours:    Intake/Output from previous day: 01/14 0701 - 01/15 0700 In: 1696 [P.O.:490; I.V.:900; IV Piggyback:306] Out: 2501 [Urine:2410; Stool:1; Chest Tube:90] Intake/Output this shift:    General appearance: alert and cooperative Neurologic: intact Heart: regular rate and rhythm, S1, S2 normal, no murmur, click, rub or gallop Lungs: clear to auscultation bilaterally Extremities: extremities normal, atraumatic, no cyanosis or edema Wound: incisions ok  Lab Results:  Basename 04/04/11 0355 04/03/11 0400  WBC 10.6* 12.1*  HGB 8.2* 8.3*  HCT 24.1* 24.4*  PLT 143* 123*   BMET:  Basename 04/04/11 0355 04/03/11 1621  NA 140 141  K 3.9 3.6  CL 106 105  CO2 26 27  GLUCOSE 104* 104*  BUN 16 18  CREATININE 0.69 0.68  CALCIUM 8.3* 8.4    PT/INR: No results found for this basename: LABPROT,INR in the last 72 hours ABG    Component Value Date/Time   PHART 7.374 04/01/2011 0358   HCO3 24.9* 04/01/2011 0358   TCO2 24 04/01/2011 1638   ACIDBASEDEF 1.0 03/31/2011 2034   O2SAT 99.0 04/01/2011 0358   CBG (last 3)   Basename 04/04/11 0801 04/04/11 0428 04/04/11 0008  GLUCAP 98 99 103*    Assessment/Plan: S/P Procedure(s) (LRB): CORONARY ARTERY BYPASS GRAFTING (CABG) (N/A) Postop delerium improved. He was on lorazepam bid at home and we will continue that. Acute blood loss anemia:  Start Fe2+ and  observe. Transfer to [facility] and continue mobilization.   LOS: 4 days    Cornella Emmer K 04/04/2011

## 2011-04-04 NOTE — Progress Notes (Signed)
Speech Language/Pathology Speech Pathology: Dysphagia Treatment Note  Patient was observed with : Thin liquids  Patient was noted to have s/s of aspiration : No:    Lung Sounds:  clear Temperature: Afebrile  Pt independently followed aspiration precautions - upright postures, small sips, no straws. No s/s of aspiration observed. Family reports pt has tolerated minced diet well, takes pill well crushed in applesauce. Reports pt struggled with whole pills. Pt refused further PO trials.     Clinical Impression: Pt demonstrates tolerance of thin liquids with precautions in place. No overt s/s of aspiration. Pt tolerating minced diet, will continue to conserve energy.   Recommendations:  Continue current diet - Dys 2/thin liquids with precautions. SLP will f/u in 2 days with hope for increased strength and endurance for diet upgrade.   Pain:   none Intervention Required:   No   Goals: Goals Partially Met  Harlon Ditty, MA CCC-SLP [phone]

## 2011-04-04 NOTE — Significant Event (Signed)
Bilateral soft wrist restraints removed. Pt demonstrates safety measures-not pulling on lines or tubes at the time. Pt spouse aware and is in the unit. Will monitor patient closely. Aydien Majette, Charity fundraiser.

## 2011-04-04 NOTE — Significant Event (Signed)
Pt transferred to 2017 safely and settled in bed. Pt spouse at bedside. All belongings taken to new room. Receiving RN and NA awared pt is in the room. Nikos Anglemyer, Charity fundraiser.

## 2011-04-05 ENCOUNTER — Other Ambulatory Visit: Payer: Self-pay

## 2011-04-05 LAB — CBC
HCT: 29.6 % — ABNORMAL LOW (ref 39.0–52.0)
Hemoglobin: 9.7 g/dL — ABNORMAL LOW (ref 13.0–17.0)
WBC: 13.7 10*3/uL — ABNORMAL HIGH (ref 4.0–10.5)

## 2011-04-05 LAB — GLUCOSE, CAPILLARY
Glucose-Capillary: 149 mg/dL — ABNORMAL HIGH (ref 70–99)
Glucose-Capillary: 118 mg/dL — ABNORMAL HIGH (ref 70–99)

## 2011-04-05 LAB — BASIC METABOLIC PANEL
BUN: 15 mg/dL (ref 6–23)
CO2: 25 mEq/L (ref 19–32)
Chloride: 107 mEq/L (ref 96–112)
Glucose, Bld: 124 mg/dL — ABNORMAL HIGH (ref 70–99)
Potassium: 3.7 mEq/L (ref 3.5–5.1)

## 2011-04-05 MED ORDER — POTASSIUM CHLORIDE CRYS ER 20 MEQ PO TBCR
40.0000 meq | EXTENDED_RELEASE_TABLET | Freq: Once | ORAL | Status: AC
  Administered 2011-04-05: 40 meq via ORAL
  Filled 2011-04-05 (×2): qty 1

## 2011-04-05 MED ORDER — AMIODARONE IV BOLUS ONLY 150 MG/100ML
150.0000 mg | Freq: Once | INTRAVENOUS | Status: AC
  Administered 2011-04-05: 150 mg via INTRAVENOUS
  Filled 2011-04-05: qty 100

## 2011-04-05 MED ORDER — METOPROLOL TARTRATE 25 MG PO TABS
25.0000 mg | ORAL_TABLET | Freq: Two times a day (BID) | ORAL | Status: DC
  Administered 2011-04-05 – 2011-04-09 (×9): 25 mg via ORAL
  Filled 2011-04-05 (×11): qty 1

## 2011-04-05 MED ORDER — AMIODARONE HCL 200 MG PO TABS
400.0000 mg | ORAL_TABLET | Freq: Two times a day (BID) | ORAL | Status: DC
  Administered 2011-04-05 – 2011-04-10 (×11): 400 mg via ORAL
  Filled 2011-04-05 (×12): qty 2

## 2011-04-05 MED ORDER — METOPROLOL TARTRATE 25 MG PO TABS
25.0000 mg | ORAL_TABLET | Freq: Two times a day (BID) | ORAL | Status: DC
  Filled 2011-04-05 (×4): qty 1

## 2011-04-05 NOTE — Progress Notes (Addendum)
                    [address]            [place] [address]          [phone]     5 Days Post-Op Procedure(s) (LRB): CORONARY ARTERY BYPASS GRAFTING (CABG) (N/A)  Subjective: Slept poorly last night.  C/O pain in shoulders which kept him awake.  Not eating well.  In AFib this am, rates currently 100s after bolus IV Amio.  Objective: Vital signs in last 24 hours: Patient Vitals for the past 24 hrs:  BP Temp Temp src Pulse Resp SpO2 Weight  04/05/11 0440 109/73 mmHg 97.4 F (36.3 C) Oral 130  19  94 % 89.994 kg (198 lb 6.4 oz)  04/04/11 2040 137/57 mmHg 98.7 F (37.1 C) Oral 105  20  97 % -  04/04/11 1551 128/57 mmHg 97.9 F (36.6 C) Oral 97  20  98 % -  04/04/11 1500 112/91 mmHg - - 92  19  97 % -  04/04/11 1400 96/64 mmHg - - 84  15  95 % -  04/04/11 1300 137/59 mmHg - - 86  19  99 % -  04/04/11 1241 - 97.8 F (36.6 C) Oral - - - -  04/04/11 1226 - - - - - 98 % -  04/04/11 1200 124/55 mmHg - - 98  21  98 % -  04/04/11 1104 - - - 98  16  97 % -  04/04/11 1006 - - - - - 97 % -  04/04/11 0802 - 97.9 F (36.6 C) Oral - - - -  04/04/11 0800 124/42 mmHg - - 91  17  99 % -   Current Weight  04/05/11 89.994 kg (198 lb 6.4 oz)     Intake/Output from previous day: 01/15 0701 - 01/16 0700 In: 1403 [P.O.:1200; I.V.:103; IV Piggyback:100] Out: 1785 [Urine:1725; Chest Tube:60]    PHYSICAL EXAM:  Heart: irr irr, tachy 100-110s Lungs: clear Wound:clean and dry Extremities: mild LE edema  Lab Results: CBC: Basename 04/05/11 0548 04/04/11 0355  WBC 13.7* 10.6*  HGB 9.7* 8.2*  HCT 29.6* 24.1*  PLT 215 143*   BMET:  Basename 04/05/11 0548 04/04/11 0355  NA 140 140  K 3.7 3.9  CL 107 106  CO2 25 26  GLUCOSE 124* 104*  BUN 15 16  CREATININE 0.73 0.69  CALCIUM 8.9 8.3*    PT/INR: No results found for this basename: LABPROT,INR in the last 72 hours   Assessment/Plan: S/P Procedure(s) (LRB): CORONARY ARTERY BYPASS GRAFTING (CABG)  (N/A) Delirium- MS stable CV- AF this am.  Started on po Amio after IV bolus given.  BPs a little soft, so will not increase Lopressor at this point.   ABL anemia- improving.  Continue Fe. Pulm- stable  Dysphagia- continue D2 diet.  Speech tx to f/u later this week. Continue PT/OT   LOS: 5 days    COLLINS,GINA H 04/05/2011    Chart reviewed, patient examined, agree with above. Will increase lopressor to 25 bid. Repleat K+

## 2011-04-05 NOTE — Progress Notes (Signed)
Pt HR is irregular ranging from 115 to 140s per min,Atrial fibrillation (confirmed with 12 leads EKG),B/P is 109/73 mmHg,RR is 20 not on distress,saturation is 94% RA.Dr Laneta Simmers made aware with orders made to give 150 mg IV amiodarone bolus and to start Amiodarone PO 400 mg BID.Will continue to monitor. Prezley Qadir RN

## 2011-04-05 NOTE — Progress Notes (Signed)
CARDIAC REHAB PHASE I   PRE:  Rate/Rhythm: 110-120 afib  BP:  Supine:   Sitting: 98/52  Standing:    SaO2: 94%RA  MODE:  Ambulation: 150 ft   POST:  Rate/Rhythem: 138afib  BP:  Supine:   Sitting: 102/59  Standing:    SaO2: 96%RA 1140-1200 Pt walked 150 ft on RA with rolling walker and asst x 2. Needs much direction. Encouraged to stay close to walker, stand upright, and observe sternal precautions. Pt needs much repetition and cues. Needs PT reordered as pt needs their asst with  D/c planning and strengthening. To recliner with call bell. Wife in room. Hr to 138 with activity.  [name]

## 2011-04-06 ENCOUNTER — Other Ambulatory Visit: Payer: Self-pay

## 2011-04-06 LAB — GLUCOSE, CAPILLARY

## 2011-04-06 NOTE — Progress Notes (Signed)
Occupational Therapy Treatment Patient Details Name: Matthew Bridges MRN: [id number] DOB: 06-04-1930 Today's Date: 04/06/2011  OT Assessment/Plan OT Assessment/Plan OT Plan: Discharge plan needs to be updated Follow Up Recommendations: Skilled nursing facility Equipment Recommended: Defer to next venue OT Goals ADL Goals Pt Will Perform Grooming: with modified independence;Standing at sink ADL Goal: Grooming - Progress: Updated due to goal met ADL Goal: Lower Body Dressing - Progress: Progressing toward goals ADL Goal: Toilet Transfer - Progress: Progressing toward goals ADL Goal: Additional Goal #1 - Progress: Not progressing (limited by dec memory)  OT Treatment Precautions/Restrictions  Precautions Precautions: Fall;Sternal Precaution Comments: pt still needing max cueing for sternal precautions Restrictions Weight Bearing Restrictions: No   ADL ADL Grooming: Performed;Supervision/safety Grooming Details (indicate cue type and reason): pt performed various grooming activities for >8min standing at sink without c/o fatigue or pain Where Assessed - Grooming: Standing at sink Lower Body Dressing: Performed;+1 Total assistance Lower Body Dressing Details (indicate cue type and reason): don socks; limited by pain when attempting to bend over Where Assessed - Lower Body Dressing: Sitting, bed Toilet Transfer: Performed;Minimal assistance Toilet Transfer Details (indicate cue type and reason): Pt. required Max VC and tactile cues to maintain hands on knees/thighs for sit to stand and stand to sit in order to maintain sternal precautions. Min A with RW ambulation to/from toilet Toilet Transfer Method: Proofreader: Raised toilet seat with arms (or 3-in-1 over toilet) Toileting - Clothing Manipulation: Not assessed Equipment Used: Rolling walker Ambulation Related to ADLs: Min A with RW ambulation with cues to stand within walker ADL Comments: Patient  continues to be greatly limited by inability to use UE for transfers (secondary to precautions) and need for constant cues to maintain this precaution. Mobility  Bed Mobility Bed Mobility: Yes Supine to Sit: 3: Mod assist Supine to Sit Details (indicate cue type and reason): max cueing for sternal precautions for limiting use of UEs.  Sitting - Scoot to Edge of Bed: 4: Min assist Sitting - Scoot to [place] of Bed Details (indicate cue type and reason): cues for precautions Transfers Sit to Stand: 3: Mod assist;From bed Sit to Stand Details (indicate cue type and reason): Max cueing and hand over hand to place hands and knees and not pull on RW.   Stand to Sit: 3: Mod assist;To chair/3-in-1 Stand to Sit Details: Again cues for sternal precautions and hands to knees  End of Session OT - End of Session Equipment Utilized During Treatment: Gait belt Activity Tolerance: Patient tolerated treatment well Patient left: in chair;with family/visitor present;with call bell in reach General Behavior During Session: [facility] for tasks performed Cognition: Impaired Cognitive Impairment: Still having difficulty with memory, safety awareness, and problem solving. pt needs repeated cueing for precautions and safety  Matthew Bridges  04/06/2011, 1:32 PM

## 2011-04-06 NOTE — Progress Notes (Signed)
CSW signing on to coordinate SNF placement. Completed psychosocial assessment (located in shadow chart). Will follow to facilitate transfer - possibly Saturday, per MD.  Baxter Flattery, MSW [phone]

## 2011-04-06 NOTE — Progress Notes (Signed)
Patient has returned to NSR according to this morning's EKG.  Will continue to monitor.  Matthew Bridges

## 2011-04-06 NOTE — Progress Notes (Addendum)
                    [address]            [place] [address]          [phone]     6 Days Post-Op Procedure(s) (LRB): CORONARY ARTERY BYPASS GRAFTING (CABG) (N/A)  Subjective: Feeling better today.  Back in sinus rhythm.  Eating better.  Objective: Vital signs in last 24 hours: Patient Vitals for the past 24 hrs:  BP Temp Temp src Pulse Resp SpO2 Weight  04/06/11 0401 130/58 mmHg 97.5 F (36.4 C) Oral 86  18  93 % 89.4 kg (197 lb 1.5 oz)  04/05/11 2133 115/72 mmHg 97.8 F (36.6 C) Oral 91  20  94 % -  04/05/11 1647 98/61 mmHg - - 113  - - -  04/05/11 1500 105/64 mmHg 98 F (36.7 C) Oral 115  18  96 % -  04/05/11 1006 110/95 mmHg - - - - - -   Current Weight  04/06/11 89.4 kg (197 lb 1.5 oz)     Intake/Output from previous day: 01/16 0701 - 01/17 0700 In: 363 [P.O.:360; I.V.:3] Out: 475 [Urine:475]    PHYSICAL EXAM:  Heart: RRR Lungs: slightly decreased BS in bases Wound: clean and dry Extremities: mild LE edema  Lab Results: CBC: Basename 04/05/11 0548 04/04/11 0355  WBC 13.7* 10.6*  HGB 9.7* 8.2*  HCT 29.6* 24.1*  PLT 215 143*   BMET:  Basename 04/05/11 0548 04/04/11 0355  NA 140 140  K 3.7 3.9  CL 107 106  CO2 25 26  GLUCOSE 124* 104*  BUN 15 16  CREATININE 0.73 0.69  CALCIUM 8.9 8.3*    PT/INR: No results found for this basename: LABPROT,INR in the last 72 hours   Assessment/Plan: S/P Procedure(s) (LRB): CORONARY ARTERY BYPASS GRAFTING (CABG) (N/A) CV- Back in SR.  Tolerating po Lopressor and Amio. Dysphagia- D2 diet.  Speech tx to f/u later this week. PT/OT/reconditioning.   LOS: 6 days    COLLINS,GINA H 04/06/2011    Chart reviewed, patient examined, agree with above. He is progressing but I don't think he can go home.  He will need SNF for rehab.  He has been seen by PT and OT and both feel that he needs SNF.  Will ask social work to consult.

## 2011-04-06 NOTE — Progress Notes (Signed)
Physical Therapy Treatment Patient Details Name: Matthew Bridges MRN: [id number] DOB: 1930-09-22 Today's Date: 04/06/2011  PT Assessment/Plan  PT - Assessment/Plan Comments on Treatment Session: pt still requiring increased A for bed mobility and with impaired cognition.  Feel best D/C option would be to ST-SNF at this time.   PT Plan: Discharge plan needs to be updated PT Frequency: Min 3X/week Follow Up Recommendations: Skilled nursing facility Equipment Recommended: Defer to next venue PT Goals  Acute Rehab PT Goals PT Goal: Rolling Supine to Left Side - Progress: Progressing toward goal PT Goal: Supine/Side to Sit - Progress: Progressing toward goal PT Goal: Sit at Edge Of Bed - Progress: Progressing toward goal PT Goal: Sit to Supine/Side - Progress: Progressing toward goal PT Goal: Sit to Stand - Progress: Partly met PT Goal: Stand to Sit - Progress: Progressing toward goal PT Goal: Ambulate - Progress: Progressing toward goal PT Goal: Perform Home Exercise Program - Progress: Progressing toward goal  PT Treatment Precautions/Restrictions  Precautions Precautions: Fall;Sternal Precaution Comments: pt still needing max cueingfor sternal precautions Restrictions Weight Bearing Restrictions: No Mobility (including Balance) Bed Mobility Bed Mobility: Yes Supine to Sit: 3: Mod assist Supine to Sit Details (indicate cue type and reason): max cueing for sternal precautions for limiting use of UEs.   Sitting - Scoot to Edge of Bed: 4: Min assist Sitting - Scoot to [place] of Bed Details (indicate cue type and reason): cues for precautions. Transfers Transfers: Yes Sit to Stand: 3: Mod assist;From bed Sit to Stand Details (indicate cue type and reason): Max cueing and hand over hand to place hands and knees and not pull on RW.   Stand to Sit: 3: Mod assist;To chair/3-in-1 Stand to Sit Details: Again cues for sternal precautions and hands to knees Ambulation/Gait Ambulation/Gait:  Yes Ambulation/Gait Assistance: 4: Min assist Ambulation/Gait Assistance Details (indicate cue type and reason): cues and A for positioning in RW, upright posture, and safety with turns.   Ambulation Distance (Feet): 150 Feet Assistive device: Rolling walker Gait Pattern: Decreased stride length;Trunk flexed Stairs: No Wheelchair Mobility Wheelchair Mobility: No    Exercise    End of Session PT - End of Session Equipment Utilized During Treatment: Gait belt Activity Tolerance: Patient tolerated treatment well Patient left: in chair;with call bell in reach Nurse Communication: Mobility status for transfers;Mobility status for ambulation General Behavior During Session: [facility] for tasks performed Cognition: Impaired Cognitive Impairment: Still having difficulty with memory, safety awareness, and problem solving.  pt needs repeated cueing for precautions and safety.    Sunny Schlein, [place] [phone] 04/06/2011, 12:45 PM

## 2011-04-06 NOTE — Progress Notes (Signed)
Physical Therapy Patient Details Name: Matthew Bridges MRN: [id number] DOB: 03-24-1930 Today's Date: 04/06/2011  Noted PT order was discontinued on 04/04/11.  Please write new order for PT to resume with pt.  Thanks!    Sunny Schlein, [place] [phone] 04/06/2011, 7:30 AM

## 2011-04-06 NOTE — Progress Notes (Signed)
CARDIAC REHAB PHASE I   PRE:  Rate/Rhythm: 83SR  BP:  Supine: 101/53  Sitting:   Standing:    SaO2: 93%RA  MODE:  Ambulation: 180 ft   POST:  Rate/Rhythem: 90  BP:  Supine:   Sitting: 110/60  Standing:    SaO2: 97%RA 1350-1425 Pt walked 180 ft on RA with rolling walker and asst x 2. Needs verbal cues and much asst to walk. Unsteady. Incontinent of urine during walk. Cleaned pt and assisted to recliner. Wife in room.  Very stiff.  [name]

## 2011-04-07 LAB — GLUCOSE, CAPILLARY
Glucose-Capillary: 124 mg/dL — ABNORMAL HIGH (ref 70–99)
Glucose-Capillary: 134 mg/dL — ABNORMAL HIGH (ref 70–99)
Glucose-Capillary: 130 mg/dL — ABNORMAL HIGH (ref 70–99)

## 2011-04-07 MED ORDER — ASPIRIN 325 MG PO TBEC: 325.0000 mg | DELAYED_RELEASE_TABLET | Freq: Every day | ORAL | Status: DC

## 2011-04-07 MED ORDER — TRAMADOL HCL 50 MG PO TABS: 50.0000 mg | ORAL_TABLET | Freq: Four times a day (QID) | ORAL | Status: AC | PRN

## 2011-04-07 MED ORDER — FERROUS GLUCONATE 324 (38 FE) MG PO TABS: 324.0000 mg | ORAL_TABLET | Freq: Two times a day (BID) | ORAL | Status: DC

## 2011-04-07 MED ORDER — AMIODARONE HCL 400 MG PO TABS: 400.0000 mg | ORAL_TABLET | Freq: Two times a day (BID) | ORAL | Status: DC

## 2011-04-07 MED ORDER — METOPROLOL TARTRATE 25 MG PO TABS: 25.0000 mg | ORAL_TABLET | Freq: Two times a day (BID) | ORAL | Status: DC

## 2011-04-07 NOTE — Progress Notes (Signed)
7 Days Post-Op Procedure(s) (LRB): CORONARY ARTERY BYPASS GRAFTING (CABG) (N/A) Subjective: No complaints.  He was reportedly confused and a little agitated overnight.  Objective: Vital signs in last 24 hours: Temp:  [97.9 F (36.6 C)-98 F (36.7 C)] 97.9 F (36.6 C) (01/17 2029) Pulse Rate:  [84-90] 88  (01/17 2029) Cardiac Rhythm:  [-] Heart block (01/18 0912) Resp:  [18] 18  (01/17 2029) BP: (99-122)/(48-68) 122/68 mmHg (01/17 2029) SpO2:  [92 %-94 %] 94 % (01/17 2029)  Hemodynamic parameters for last 24 hours:    Intake/Output from previous day: 01/17 0701 - 01/18 0700 In: 960 [P.O.:960] Out: 445 [Urine:445] Intake/Output this shift:    General appearance: alert and cooperative Heart: regular rate and rhythm, S1, S2 normal, no murmur, click, rub or gallop Lungs: clear to auscultation bilaterally Extremities: extremities normal, atraumatic, no cyanosis or edema Wound: incisions ok  Lab Results:  Basename 04/05/11 0548  WBC 13.7*  HGB 9.7*  HCT 29.6*  PLT 215   BMET:  Basename 04/05/11 0548  NA 140  K 3.7  CL 107  CO2 25  GLUCOSE 124*  BUN 15  CREATININE 0.73  CALCIUM 8.9    PT/INR: No results found for this basename: LABPROT,INR in the last 72 hours ABG    Component Value Date/Time   PHART 7.374 04/01/2011 0358   HCO3 24.9* 04/01/2011 0358   TCO2 24 04/01/2011 1638   ACIDBASEDEF 1.0 03/31/2011 2034   O2SAT 99.0 04/01/2011 0358   CBG (last 3)   Basename 04/07/11 0636 04/06/11 2026 04/06/11 1631  GLUCAP 124* 118* 134*    Assessment/Plan: S/P Procedure(s) (LRB): CORONARY ARTERY BYPASS GRAFTING (CABG) (N/A) He is doing well medically. Speech therapy to reevaluate swallowing for diet advancement. Postop Afib:  Maintaining sinus on current regimen. Postop delerium:  Sundowning.  This will improve with time. DC leg staples prior to discharge. SNF when bed available. Discussed status with wife.   LOS: 7 days    Zakariyah Freimark K 04/07/2011

## 2011-04-07 NOTE — Progress Notes (Signed)
CARDIAC REHAB PHASE I   PRE:  Rate/Rhythm: 85SR  BP:  Supine:   Sitting: 100/50  Standing:    SaO2: 98%RA  MODE:  Ambulation: 230 ft   POST:  Rate/Rhythem: 93  BP:  Supine:   Sitting: 100/60  Standing:    SaO2: 97%RA 0837-0900 Pt assisted to new room. Room change due to room cold. Pt walked 230 ft on RA with rolling walker and asst x 2. Tried to void prior to walk without success. Pt wants to go home. Emotional support given. To recliner with call bell and chair alarm. Wife in room. Needs verbal cues when walking.  [name]

## 2011-04-07 NOTE — Discharge Summary (Signed)
[address]            Matthew Bridges [address]          [phone]      Matthew Bridges 06-Apr-1930 78 y.o. [id number]  03/31/2011   Matthew Borne, MD  CAD  HPI:  The patient is a 78 year old gentleman with a history of significant pulmonary problems that started last summer around July with bronchiectasis and repeat pulmonary infections, one of which required a 9 day hospital stay. He was treated with steroids for postinflammatory pulmonary fibrosis and was sent home on oxygen which has been weaned off. A cardiac workup was performed since the patient continued to have episodes of shortness of breath associated with substernal chest discomfort. He reports that these episodes have been occurring with less and less exertion. His wife has also noticed exertional fatigue. Cardiac catheterization was performed on 02/02/2011 and showed significant two-vessel coronary disease with normal left ventricular function. It was felt by Matthew Bridges that he may be having significant anginal symptoms due to to ischemia.  Past Medical History   Diagnosis  Date   .  Bronchiectasis    .  Nephrolithiasis    .  Pneumonia    .  Hyperlipidemia    .  Insomnia    .  Alcohol abuse    .  Asthmatic bronchitis    .  Pleurisy    .  GERD (gastroesophageal reflux disease)    .  Diverticulosis      Colonoscopy 2007/Dr. Leone Bridges   .  Hx of colonic polyp     Past Surgical History   Procedure  Date   .  Tonsillectomy     Family History   Problem  Relation  Age of Onset   .  Heart disease  Mother    .  Colon cancer  Father    .  Alcohol abuse  Father    .  Colon cancer  Brother    .  Lung cancer  Brother    .  Breast cancer  Sister    .  Heart disease  Other       maternal side    Social History  History   Substance Use Topics   .  Smoking status:  Former Smoker -- 1.5 packs/day for 10 years     Types:  Cigarettes     Quit date:  03/20/1956   .  Smokeless tobacco:   Former Matthew Bridges     Types:  Snuff, Chew   .  Alcohol Use:  Yes    Current Outpatient Prescriptions   Medication  Sig  Dispense  Refill   .  albuterol (PROAIR HFA) 108 (90 BASE) MCG/ACT inhaler  Inhale 2 puffs into the lungs 4 (four) times daily as needed.     Marland Kitchen  aspirin 81 MG tablet  Take 81 mg by mouth daily.     .  beclomethasone (QVAR) 80 MCG/ACT inhaler  Inhale 2 puffs into the lungs 2 (two) times daily.  1 Inhaler  6   .  guaifenesin (HUMIBID E) 400 MG TABS  Take 400 mg by mouth 2 (two) times daily as needed.     .  isosorbide mononitrate (IMDUR) 60 MG 24 hr tablet  Take 1 tablet (60 mg total) by mouth daily.  30 tablet  12   .  LORazepam (ATIVAN) 1  MG tablet  Take 1 mg by mouth 2 (two) times daily.     .  nitroGLYCERIN (NITROSTAT) 0.4 MG SL tablet  Place 0.4 mg under the tongue every 5 (five) minutes as needed.     Marland Kitchen  omeprazole (PRILOSEC) 20 MG capsule  Take 1 capsule (20 mg total) by mouth daily.  30 capsule  5    Allergies   Allergen  Reactions   .  Codeine     Review of Systems: At time of consultation General: He denies any fever or chills. He has had no recent weight loss. He does report exertional fatigue.  Eyes: Negative  ENT: Negative  Endocrine: He denies diabetes and hypothyroidism.  Cardiac: He does report exertional substernal chest discomfort, shortness of breath, and fatigue. He denies orthopnea and PND. He has had no palpitations or peripheral edema.  Respiratory: He denies cough and sputum production.  GI: He denies nausea and vomiting. He has had no melena or bright red blood per rectum.  GU: He denies dysuria and hematuria.  Neurological: He denies any focal weakness or numbness. He does have occasional dizziness and some difficulty with balance and uses a walker or cane. He does report a tremor. He reports having a small stroke in the past.  Hematological: He denies any history of bleeding disorders or easy bleeding.  Psychiatric: He denies any history of  depression. He does report anxiety attacks and is on chronic lorazepam.  Musculoskeletal: He does report arthritis.  BP 142/80  Pulse 82  Resp 20  Ht 5\' 11"  (1.803 m)  Wt 205 lb (92.987 kg)  BMI 28.59 kg/m2  SpO2 98%  Physical Exam: At time of consultation He is an elderly, white male in no distress.  HEENT: Normocephalic and atraumatic. Pupils are equal and reactive to light and accommodation. Extraocular muscles are intact. Oropharynx is clear.  Neck: Carotid pulses are palpable bilaterally. There no bruits. There is no adenopathy or thyromegaly.  Heart: Regular rate and rhythm with normal S1 and S2. There is no murmur, rub, or gallop.  Lungs: Clear  Abdomen: Bowel sounds are present. Abdomen is soft and nontender. There are no palpable masses or organomegaly.  Extremities: No peripheral edema. Pedal pulses are palpable bilaterally. He has severe contraction of the fingers of his right hand, being worse in the fourth and fifth fingers.  Skin: Warm and dry.  Neurological: He is alert and oriented x3. He has obvious tremor involving his head and upper extremities. Motor strength is 5 out of 5 in all extremities. Sensation is grossly intact.  Diagnostic Tests:  Hemodynamics:  Right atrial pressure is 7/6 / 5  Right ventricular pressure: 25/8-mean of 8  Pulmonary capillary wedge pressure is 7  Pulmonary artery pressure: 26/9-mean of 16  Left ventricular pressures 123/8  [name] pressure is 119/56  Aortic saturation: 90%  Pulmonary artery saturation: 65%  Hemoglobin 14.3  Fick cardiac output equals 5.5 L a minute  Thermodilution cardiac output is 4.9 L per minute  Coronary angiograms: Standard catheters were used including a Judkins left 4 and a 3-D RC (4 [place]). The catheters were exchanged out over a guidewire.  Coronary angiography:  The left main is fairly smooth and normal.  Left anterior descending artery: The LAD has mild to moderate calcifications. There is a proximal 70%  stenosis. There is a mid 80-90% stenosis and a terminal 80-90% stenosis right before the bifurcation. The first diagonal artery is fairly small branch that originates after the  proximal stenosis. There is no significant irregularities.  Left circumflex artery:  The left circumflex artery provides flow to a large piece marginal branch. The left complex artery is subtotally occluded just after giving off the first acute marginal branch. This continuation branch fills very slowly. The first acute marginal artery has minor luminal regularities.  Right coronary artery:  The right coronary artery is severely and diffusely diseased. There are 80-90% stenosis in the proximal vessel, mid vessel, and distally. There is a tight 90% stenosis just prior to the bifurcation. There is a tight 90% stenosis in the mid/distal posterior descending artery. The posterior lateral branch has mild diffuse irregular disease.  Left ventriculogram:  The left ventriculogram was performed in the 30 RAO position. It reveals overall normal left ventricular systolic function with an ejection fraction of 60%.  PFTs: FEV1 is 2.77 or 107% of predicted. Lung volumes are normal. Diffusion capacity is moderately reduced. The patient was admitted to the hospital and scheduled for the procedure. On 03/31/2011 the patient underwent the following procedure:  OPERATIVE REPORT  PREOPERATIVE DIAGNOSIS: Severe two-vessel coronary artery disease with  unstable angina  POSTOPERATIVE DIAGNOSIS: Severe two-vessel coronary artery disease with  unstable angina.  OPERATIVE PROCEDURE: Median sternotomy, extracorporeal circulation,  coronary bypass graft surgery x3 using a left internal mammary artery  graft to the left anterior descending coronary artery, with a sequential  saphenous vein graft to the distal right coronary artery, and then the  posterior descending coronary artery. Endoscopic vein harvesting from  the right leg.  ATTENDING SURGEON:  Evelene Croon, M.D.  ASSISTANT: Al Corpus, SA.   Postoperative hospital course:  The patient has overall progressed nicely. He did initially have some postoperative delirium which has resolved with time. CT scan was unremarkable for acute findings although there was some evidence of old infarct. He was on lorazepam preoperatively which has been restarted. Additionally he has had some difficulty with swallowing. Currently he is on a dysphasia 2 thin liquid diet with precautions as recommended by speech therapy. The patient has also had postoperative atrial fibrillation but has been chemically cardioverted to normal sinus rhythm. He is otherwise remained quite hemodynamically stable. All routine lines, monitors, drainage devices have been discontinued in the standard fashion. Incisions are healing well without evidence of infection. He is tolerating a gradual increase activities using standard protocols but it is felt that he will require at least short-term skilled nursing facility for ongoing rehabilitation. He does have an acute blood loss anemia with stable values. Currently his status is felt to be tentatively stable for transfer to the nursing facility when a bed becomes available.   Basename 04/05/11 0548  NA 140  K 3.7  CL 107  CO2 25  GLUCOSE 124*  BUN 15  CALCIUM 8.9    Basename 04/05/11 0548  WBC 13.7*  HGB 9.7*  HCT 29.6*  PLT 215   No results found for this basename: INR:2 in the last 72 hours   Discharge Instructions:  The patient is discharged to home with extensive instructions on wound care and progressive ambulation.  They are instructed not to drive or perform any heavy lifting until returning to see the physician in his office.  Discharge Diagnosis:  CAD  Secondary Diagnosis: Patient Active Problem List  Diagnoses  . HYPERLIPIDEMIA, MILD  . Alcohol abuse, in remission  . Other chronic pain  . BRONCHIECTASIS  . COPD  . GERD  . DIVERTICULOSIS-COLON  .  CONSTIPATION  . INSOMNIA  .  PERSONAL HX COLONIC POLYPS  . HX OF GALLSTONE  . NEPHROLITHIASIS, HX OF  . Postinflammatory pulmonary fibrosis  . Preventative health care  . Epistaxis  . Anxiety  . Dizziness  . Chest pain on exertion   Past Medical History  Diagnosis Date  . Bronchiectasis   . Nephrolithiasis   . Pneumonia   . Hyperlipidemia   . Insomnia   . Alcohol abuse   . Asthmatic bronchitis   . Pleurisy   . GERD (gastroesophageal reflux disease)   . Diverticulosis     Colonoscopy 2007/Dr. Leone Bridges  . Hx of colonic polyp   . H/O hiatal hernia   . Blood transfusion   . Anemia   . 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 Arthritis   . Anxiety   . Shortness of breath   . Coronary artery disease     sees Matthew Bridges, saw 1 month ago  . COPD (chronic obstructive pulmonary disease)     sees Dr/ Delford Field, saw last Dec 2012  . S/P CABG (coronary artery bypass graft)        Matthew Bridges, Matthew Bridges  Home Medication Instructions AVW:[id number]   Printed on:04/07/11 1400  Medication Information                    guaifenesin (HUMIBID E) 400 MG TABS Take 400 mg by mouth 2 (two) times daily as needed. For cough           albuterol (PROAIR HFA) 108 (90 BASE) MCG/ACT inhaler Inhale 2 puffs into the lungs 4 (four) times daily as needed. For shortness of breath           LORazepam (ATIVAN) 1 MG tablet Take 1 mg by mouth 2 (two) times daily.             beclomethasone (QVAR) 80 MCG/ACT inhaler Inhale 2 puffs into the lungs 2 (two) times daily.           omeprazole (PRILOSEC) 20 MG capsule Take 20 mg by mouth daily.           amiodarone (PACERONE) 400 MG tablet Take 1 tablet (400 mg total) by mouth 2 (two) times daily. For 7 days then 400 mg once daily           aspirin EC 325 MG EC tablet Take 1 tablet (325 mg total) by mouth daily.           ferrous gluconate (FERGON) 324 MG tablet Take 1 tablet (324 mg total) by mouth 2 (two) times daily with a meal.            metoprolol tartrate (LOPRESSOR) 25 MG tablet Take 1 tablet (25 mg total) by mouth 2 (two) times daily.           traMADol (ULTRAM) 50 MG tablet Take 1-2 tablets (50-100 mg total) by mouth every 6 (six) hours as needed.             Disposition: To be discharged to skilled nursing facility  Patient's condition is Good  Gershon Crane, PA-C 04/07/2011  2:00 PM

## 2011-04-07 NOTE — Progress Notes (Signed)
   CARE MANAGEMENT NOTE 04/07/2011  Patient:  Matthew Bridges,Matthew Bridges   Account Number:  [id number]  Date Initiated:  04/04/2011  Documentation initiated by:  [facility]  Subjective/Objective Assessment:   post op CABG     Action/Plan:   PTA, PT LIVES WITH WIFE IN [facility].   Anticipated DC Date:  04/08/2011   Anticipated DC Plan:  SKILLED NURSING FACILITY  In-house referral  Clinical Social Worker      DC Planning Services  CM consult      Choice offered to / List presented to:             Status of service:  In process, will continue to follow Medicare Important Message given?   (If response is "NO", the following Medicare IM given date fields will be blank) Date Medicare IM given:   Date Additional Medicare IM given:    Discharge Disposition:    Per UR Regulation:  Reviewed for med. necessity/level of care/duration of stay  Comments:  04/07/11 Jaida Basurto,RN,BSN PT QUITE DECONDITIONED; WILL NEED SHORT TERM SNF FOR REHAB. REFERRAL TO CSW TO FACILITATE DISCHARGE TO SNF WHEN MEDICALLY STABLE. Phone #[phone]   04-07-11 8pm Avie Arenas, RNBSN - [phone] UR Completed.  04-04-11 4:40pm Johny Shears - [phone] UR completed.

## 2011-04-07 NOTE — Progress Notes (Signed)
CSW working to secure SNF placement for pt Monday morning. Will continue to follow.   Baxter Flattery, MSW [phone]

## 2011-04-07 NOTE — Progress Notes (Signed)
Speech Language/Pathology  Patient Details Name: Laurice Kimmons MRN: [id number] DOB: Feb 22, 1931 Today's Date: 04/07/2011  Speech Pathology: Dysphagia Treatment Note  Patient was observed with : Mechanical Soft textures and Thin liquids.  Patient was noted to have s/s of aspiration : Yes, Cough X 1 with regular textures secondary to talking with full oral cavity.   Lung Sounds:  Diminished  Temperature: afebrile  Patient required: Min verbal cues to consistently follow precautions/strategies of small bites and not talking while oral cavity is full.  Clinical Impression: Pt demonstrates prolonged mastication with regular textures. Pt's wife reports prolonged mastication at baseline but mastication also impacted by decreased sustained attention to bolus and pt was easily distracted by t.v., visitors, etc throughout treatment session. Pt's wife also reports pt has been eating food from the downstairs cafeteria (sandwich, pancakes, etc.) without difficulty.   Recommendations: Recommend diet upgrade to Dys. 3 textures and continue thin liquids via cup.   Pain:   none  Goals: progressing    Mrytle Bento 04/07/2011, 3:02 PM

## 2011-04-07 NOTE — Progress Notes (Signed)
UR Completed.  Matthew Bridges Jane [phone] 04/07/2011  

## 2011-04-08 LAB — GLUCOSE, CAPILLARY
Glucose-Capillary: 137 mg/dL — ABNORMAL HIGH (ref 70–99)
Glucose-Capillary: 117 mg/dL — ABNORMAL HIGH (ref 70–99)
Glucose-Capillary: 115 mg/dL — ABNORMAL HIGH (ref 70–99)

## 2011-04-08 NOTE — Progress Notes (Signed)
CARDIAC REHAB PHASE I   PRE:  Rate/Rhythm: 80 SR  BP:  Supine: 132/62  Sitting:   Standing:    SaO2: 94   % RA  MODE:  Ambulation: 500 ft   POST:  Rate/Rhythem: 90  BP:  Supine:   Sitting: 140/62  Standing:    SaO2: 96 RA  [id number]  Ambulated with assist x 2 using rolling walker.  Gait fairly steady with walker, short steps, took several standing rest breaks.  Returned to bedside chair for lunch.  Call bell placed within reach, wife with patient.  Jackey Loge

## 2011-04-08 NOTE — Progress Notes (Addendum)
[address]            [place] [address]          [phone]     8 Days Post-Op  Procedure(s) (LRB): CORONARY ARTERY BYPASS GRAFTING (CABG) (N/A) Subjective: Remains quite weak  Objective  Telemetry NSR since about 16:00 yesterday  Temp:  [97.9 F (36.6 C)-98.2 F (36.8 C)] 97.9 F (36.6 C) (01/19 0408) Pulse Rate:  [77-87] 77  (01/19 0408) Resp:  [18-19] 19  (01/19 0408) BP: (105-124)/(55-68) 105/55 mmHg (01/19 0408) SpO2:  [95 %-98 %] 95 % (01/19 0408) Weight:  [197 lb 4.8 oz (89.495 kg)] 197 lb 4.8 oz (89.495 kg) (01/19 0408)   Intake/Output Summary (Last 24 hours) at 04/08/11 0854 Last data filed at 04/08/11 0106  Gross per 24 hour  Intake    480 ml  Output    100 ml  Net    380 ml       General appearance: alert, fatigued and no distress Heart: regular rate and rhythm and S1, S2 normal Lungs: mildly diminished in the bases Abdomen: benign exam Extremities: minor LE edema Wound: incisions healing well without signs of infection  Lab Results: No results found for this basename: NA:2,K:2,CL:2,CO2:2,GLUCOSE:2,BUN:2,CREATININE:2,CALCIUM:2,MG:2,PHOS:2 in the last 72 hours No results found for this basename: AST:2,ALT:2,ALKPHOS:2,BILITOT:2,PROT:2,ALBUMIN:2 in the last 72 hours No results found for this basename: LIPASE:2,AMYLASE:2 in the last 72 hours No results found for this basename: WBC:2,NEUTROABS:2,HGB:2,HCT:2,MCV:2,PLT:2 in the last 72 hours No results found for this basename: CKTOTAL:4,CKMB:4,TROPONINI:4 in the last 72 hours No components found with this basename: POCBNP:3 No results found for this basename: DDIMER in the last 72 hours No results found for this basename: HGBA1C in the last 72 hours No results found for this basename: CHOL,HDL,LDLCALC,TRIG,CHOLHDL in the last 72 hours No results found for this basename: TSH,T4TOTAL,FREET3,T3FREE,THYROIDAB in the last 72 hours No results found for this basename:  VITAMINB12,FOLATE,FERRITIN,TIBC,IRON,RETICCTPCT in the last 72 hours  Medications: Scheduled    . amiodarone  400 mg Oral BID  . aspirin EC  325 mg Oral Daily   Or  . aspirin  324 mg Per Tube Daily  . bisacodyl  10 mg Oral Daily   Or  . bisacodyl  10 mg Rectal Daily  . docusate sodium  200 mg Oral Daily  . enoxaparin  30 mg Subcutaneous Q24H  . ferrous gluconate  324 mg Oral BID WC  . insulin aspart  0-24 Units Subcutaneous TID AC & HS  . LORazepam  1 mg Oral BID  . metoprolol tartrate  25 mg Oral BID  . pantoprazole  40 mg Oral Q1200  . povidone-iodine  1 application Topical BID  . sodium chloride  3 mL Intravenous Q12H     Radiology/Studies:  No results found.  INR: Will add last result for INR, ABG once components are confirmed Will add last 4 CBG results once components are confirmed  Assessment/Plan: S/P Procedure(s) (LRB): CORONARY ARTERY BYPASS GRAFTING (CABG) (N/A)  1. Slow , steady progress 2. D3 diet, thin liquids per speech 3. Monitor rhythm on current rx 4. Push rehab/pulm toilet as able 5. Recheck labs in am  LOS: 8 days    Bridges,Matthew E 1/19/20138:54 AM    I have seen and examined Matthew Bridges and agree with the above assessment  and plan.  Delight Ovens MD Beeper [phone] Office [phone] 04/08/2011 7:07  PM

## 2011-04-09 LAB — BASIC METABOLIC PANEL
BUN: 16 mg/dL (ref 6–23)
Chloride: 109 mEq/L (ref 96–112)
Creatinine, Ser: 0.7 mg/dL (ref 0.50–1.35)
Glucose, Bld: 119 mg/dL — ABNORMAL HIGH (ref 70–99)
Potassium: 3.8 mEq/L (ref 3.5–5.1)

## 2011-04-09 LAB — CBC
HCT: 26.4 % — ABNORMAL LOW (ref 39.0–52.0)
Hemoglobin: 8.6 g/dL — ABNORMAL LOW (ref 13.0–17.0)
MCHC: 32.6 g/dL (ref 30.0–36.0)
MCV: 93 fL (ref 78.0–100.0)
WBC: 11.5 10*3/uL — ABNORMAL HIGH (ref 4.0–10.5)

## 2011-04-09 LAB — GLUCOSE, CAPILLARY: Glucose-Capillary: 128 mg/dL — ABNORMAL HIGH (ref 70–99)

## 2011-04-09 MED ORDER — PHENOL 1.4 % MT LIQD
1.0000 | OROMUCOSAL | Status: DC | PRN
  Filled 2011-04-09: qty 177

## 2011-04-09 NOTE — Progress Notes (Signed)
04/09/2011 1700 Nursing Note:  Patient ambulated 179ft with RN, RW and on RA. Patient tolerated walk well. Encouraged one more walk this evening.  Oneill Bais, Blanchard Kelch

## 2011-04-09 NOTE — Progress Notes (Signed)
Pt ambulated 300 ft in hallway using rolling walker.  Assist x 1.  Gait slightly unsteady.  Pt stated he was feeling tired but overall tolerated walk well.  Pt assisted into chair after walk.   Caryl Asp, Student Nurse

## 2011-04-09 NOTE — Progress Notes (Signed)
[address]            [place] [address]          [phone]     9 Days Post-Op  Procedure(s) (LRB): CORONARY ARTERY BYPASS GRAFTING (CABG) (N/A) Subjective: Feels a little stronger  Objective  Telemetry episodes of atrial fibrillation  Temp:  [97.5 F (36.4 C)-97.9 F (36.6 C)] 97.6 F (36.4 C) (01/20 0443) Pulse Rate:  [72-81] 72  (01/20 0443) Resp:  [17-18] 18  (01/20 0443) BP: (103-109)/(52-68) 109/66 mmHg (01/20 0443) SpO2:  [94 %-97 %] 94 % (01/20 0443) Weight:  [197 lb 8 oz (89.585 kg)] 197 lb 8 oz (89.585 kg) (01/20 0500)   Intake/Output Summary (Last 24 hours) at 04/09/11 0912 Last data filed at 04/09/11 0500  Gross per 24 hour  Intake    480 ml  Output    350 ml  Net    130 ml       General appearance: alert, cooperative, fatigued and no distress Heart: regular rate and rhythm Lungs: min diminished in bases Abdomen: soft, nontender, non distended Extremities: no edema, redness or tenderness in the calves or thighs Wound: incisions healing well  Lab Results:  Basename 04/09/11 0610  NA 142  K 3.8  CL 109  CO2 24  GLUCOSE 119*  BUN 16  CREATININE 0.70  CALCIUM 8.7  MG --  PHOS --   No results found for this basename: AST:2,ALT:2,ALKPHOS:2,BILITOT:2,PROT:2,ALBUMIN:2 in the last 72 hours No results found for this basename: LIPASE:2,AMYLASE:2 in the last 72 hours  Basename 04/09/11 0610  WBC 11.5*  NEUTROABS --  HGB 8.6*  HCT 26.4*  MCV 93.0  PLT 281   No results found for this basename: CKTOTAL:4,CKMB:4,TROPONINI:4 in the last 72 hours No components found with this basename: POCBNP:3 No results found for this basename: DDIMER in the last 72 hours No results found for this basename: HGBA1C in the last 72 hours No results found for this basename: CHOL,HDL,LDLCALC,TRIG,CHOLHDL in the last 72 hours No results found for this basename: TSH,T4TOTAL,FREET3,T3FREE,THYROIDAB in the last 72 hours No results  found for this basename: VITAMINB12,FOLATE,FERRITIN,TIBC,IRON,RETICCTPCT in the last 72 hours  Medications: Scheduled    . amiodarone  400 mg Oral BID  . aspirin EC  325 mg Oral Daily   Or  . aspirin  324 mg Per Tube Daily  . bisacodyl  10 mg Oral Daily   Or  . bisacodyl  10 mg Rectal Daily  . docusate sodium  200 mg Oral Daily  . enoxaparin  30 mg Subcutaneous Q24H  . ferrous gluconate  324 mg Oral BID WC  . insulin aspart  0-24 Units Subcutaneous TID AC & HS  . LORazepam  1 mg Oral BID  . metoprolol tartrate  25 mg Oral BID  . pantoprazole  40 mg Oral Q1200  . povidone-iodine  1 application Topical BID  . sodium chloride  3 mL Intravenous Q12H     Radiology/Studies:  No results found.  INR: Will add last result for INR, ABG once components are confirmed Will add last 4 CBG results once components are confirmed  Assessment/Plan: S/P Procedure(s) (LRB): CORONARY ARTERY BYPASS GRAFTING (CABG) (N/A) 1. PAF - cont amiodarone oral load, on lovenox, not a good coumadin candidate 2. Cont rehab, for SNF soon    LOS: 9 days    Bridges,Matthew E 1/20/20139:12 AM

## 2011-04-09 NOTE — Progress Notes (Signed)
Patient refused to walk during night shift.  Will continue to monitor.

## 2011-04-10 LAB — GLUCOSE, CAPILLARY: Glucose-Capillary: 102 mg/dL — ABNORMAL HIGH (ref 70–99)

## 2011-04-10 MED ORDER — AMIODARONE HCL 400 MG PO TABS: 400.0000 mg | ORAL_TABLET | Freq: Two times a day (BID) | ORAL | Status: DC

## 2011-04-10 MED ORDER — FERROUS GLUCONATE 324 (38 FE) MG PO TABS: 324.0000 mg | ORAL_TABLET | Freq: Two times a day (BID) | ORAL | Status: DC

## 2011-04-10 MED ORDER — METOPROLOL TARTRATE 25 MG PO TABS: 25.0000 mg | ORAL_TABLET | Freq: Two times a day (BID) | ORAL | Status: DC

## 2011-04-10 NOTE — Progress Notes (Signed)
[id number] Cardiac Rehab Completed discharge education with pt. He agrees to [facility]. CRP in [place], will send referral.

## 2011-04-10 NOTE — Progress Notes (Signed)
Occupational Therapy Treatment Patient Details Name: Octavio Matheney MRN: [id number] DOB: [birth year] Today's Date: 04/10/2011  OT Assessment/Plan OT Assessment/Plan Comments on Treatment Session: pts. family states they will do d/c home with a sitter, and hh therapies if they can not go to the specific snf they want to go to OT Frequency: Min 2X/week Follow Up Recommendations: Skilled nursing facility Equipment Recommended: Defer to next venue OT Goals ADL Goals ADL Goal: Grooming - Progress: Progressing toward goals ADL Goal: Toilet Transfer - Progress: Progressing toward goals ADL Goal: Toileting - Hygiene - Progress: Progressing toward goals ADL Goal: Additional Goal #1 - Progress: Progressing toward goals  OT Treatment Precautions/Restrictions  Precautions Precautions: Fall;Sternal Precaution Comments: max. inst. cues to maintain sternal precautions Restrictions Weight Bearing Restrictions: No   ADL ADL Grooming: Performed;Wash/dry hands;Teeth care;Brushing hair;Supervision/safety Where Assessed - Grooming: Standing at sink Toilet Transfer: Simulated;Other (comment) (ambulating to recliner) Toilet Transfer Details (indicate cue type and reason): max. inst. cues to maintain sternal precautions for sit/stand, stand/sit Toilet Transfer Method: Ambulating Toileting - Clothing Manipulation: Other (comment) (needs elevated surface ie: 3-n-1) Where Assessed - Toileting Clothing Manipulation: Not assessed Toileting - Hygiene: Not assessed Where Assessed - Toileting Hygiene: Not assessed Tub/Shower Transfer: Not assessed Tub/Shower Transfer Method: Not assessed Equipment Used: Rolling walker Ambulation Related to ADLs: rw for ambulation, cues to stay inside walker ADL Comments: requires max cues to  maintain sternal precautions during functional act./movements Mobility  Bed Mobility Bed Mobility: Yes Rolling Left: Other (comment) (see p.t. note for  details) Transfers Transfers: Yes Sit to Stand: 3: Mod assist Sit to Stand Details (indicate cue type and reason): con't. inst. cues to maintain sternal precautions, pt. tries to push up from surface and pull up on walker Stand to Sit: 3: Mod assist Stand to Sit Details: cues for sternal precautions Exercises    End of Session OT - End of Session Activity Tolerance: Patient tolerated treatment well Patient left: in chair;with call bell in reach;with family/visitor present General Behavior During Session: [facility] for tasks performed Cognition: Impaired Cognitive Impairment: difficulty with memory, safety awareness, and problem solving  Robet Leu COTA/L 04/10/2011, 12:52 PM

## 2011-04-10 NOTE — Progress Notes (Signed)
Physical Therapy Treatment Patient Details Name: Matthew Bridges MRN: [id number] DOB: 31-Jul-1930 Today's Date: 04/10/2011  PT Assessment/Plan  PT - Assessment/Plan Comments on Treatment Session: Family only intereseted in [facility]. If he doesn't get a bed offer there son and wife are willing to take him home with 24 hour assist (possibly Chartered loss adjuster). They understand that his cognition, overal mobility status and sternal precautions put him at a risk of falling and that he will need increased support at home.  PT Plan: Discharge plan remains appropriate (if pt does go home he will need HHPT) Equipment Recommended: Defer to next venue PT Goals  Acute Rehab PT Goals PT Goal: Rolling Supine to Left Side - Progress: Progressing toward goal PT Goal: Supine/Side to Sit - Progress: Progressing toward goal PT Goal: Sit at Edge Of Bed - Progress: Met PT Goal: Sit to Supine/Side - Progress:  (not addressed) Pt will go Sit to Stand: with min assist;without upper extremity assist PT Goal: Sit to Stand - Progress: Updated due to goal met PT Goal: Stand to Sit - Progress: Progressing toward goal PT Goal: Ambulate - Progress: Progressing toward goal PT Goal: Perform Home Exercise Program - Progress: Progressing toward goal  PT Treatment Precautions/Restrictions  Precautions Precautions: Sternal;Fall Precaution Comments: max. inst. cues to maintain sternal precautions Restrictions Weight Bearing Restrictions: No Mobility (including Balance) Bed Mobility Bed Mobility: Yes Rolling Left: 3: Mod assist Rolling Left Details (indicate cue type and reason): mod cueing to sequence and to initiate rotation; pt needing min hand over hand cueing to maintain holding onto cardiac pillow to prevent reaching and pulling on railing;  Left Sidelying to Sit: 3: Mod assist Left Sidelying to Sit Details (indicate cue type and reason): mod facilitation to bring trunk upright; again needing hand over hand to  maintain sternal precautions while pt held cardiac pillow Sitting - Scoot to Edge of Bed: 3: Mod assist Sitting - Scoot to Edge of Bed Details (indicate cue type and reason): use of pad to assist with reciprocal scooting Transfers Sit to Stand: 3: Mod assist Sit to Stand Details (indicate cue type and reason): had pt continue to hold onto pillow to discourage pushing up from bed; pt needing mod faciliation for follow through to stand used elevated surface to assist with sit->stand (pt responds well to holding onto pillow and using rocking and counting to 3 in prep to stand)  Stand to Sit: 3: Mod assist Stand to Sit Details: mod faciliatory cues for flexion; again hand over hand to prevent reaching back for chair; pt fearful when he can't reach back; faciliation to assist in lower to chair Ambulation/Gait Ambulation/Gait Assistance: 4: Min assist Ambulation/Gait Assistance Details (indicate cue type and reason): amb approx 350 ft with RW; cues for upright posture and safety with RW during turning; pt with shuffled feet and gets a forward momentum needing cueing to slow down Ambulation Distance (Feet): 350 Feet Assistive device: Rolling walker Gait Pattern: Shuffle;Trunk flexed    Exercise    End of Session PT - End of Session Equipment Utilized During Treatment: Gait belt Activity Tolerance: Patient tolerated treatment well Patient left: in chair;with call bell in reach;with family/visitor present (chair alarm activated) Nurse Communication: Mobility status for transfers;Mobility status for ambulation General Behavior During Session: [facility] for tasks performed Cognition: Impaired Cognitive Impairment: difficulty with memory, safety awareness, and problem solving Seen this session with OT, see OT note for further details concerning treatment session.   Matthew Bridges 04/10/2011, 1:35 PM

## 2011-04-10 NOTE — Progress Notes (Signed)
Addendum Note to D/C Matthew Bridges is a 78 y.o. male who is S/P Procedure(s): CORONARY ARTERY BYPASS GRAFTING (CABG).  He has remained afebrile, hemodynamically stable, and in sinus rhythm (has had PAF). The patient is stable and ready for discharge.   No changes to History, Physical Exam, Meds or D/C instructions  Ardelle Balls PA-C 04/10/2011 11:51 AM

## 2011-04-10 NOTE — Progress Notes (Signed)
10 Days Post-Op Procedure(s) (LRB): CORONARY ARTERY BYPASS GRAFTING (CABG) (N/A)  Subjective: Patient hopes to go either to SNF or possible home with [facility], PT etc.  Objective: Vital signs in last 24 hours: Patient Vitals for the past 24 hrs:  BP Temp Temp src Pulse Resp SpO2 Weight  04/10/11 0426 99/59 mmHg 98.3 F (36.8 C) Oral 70  18  93 % 200 lb 1.6 oz (90.765 kg)  04/09/11 2014 118/69 mmHg 98.5 F (36.9 C) Oral 74  19  97 % -  04/09/11 1417 107/60 mmHg 97 F (36.1 C) Oral 68  20  96 % -  04/09/11 1038 104/67 mmHg - - 77  - - -   Pre op weight 92.5 kg Current Weight  04/10/11 200 lb 1.6 oz (90.765 kg)        Intake/Output from previous day: 01/20 0701 - 01/21 0700 In: 360 [P.O.:360] Out: 500 [Urine:500]   Physical Exam:  Cardiovascular: RRR, no murmurs, gallops, or rubs. Pulmonary: Slightly decreased at right base; no rales, wheezes, or rhonchi. Abdomen: Soft, non tender, bowel sounds present. Extremities: Trace bilateral lower extremity edema. Wounds: Clean and dry.  No erythema or signs of infection.  Lab Results: CBC: Basename 04/09/11 0610  WBC 11.5*  HGB 8.6*  HCT 26.4*  PLT 281   BMET:  Basename 04/09/11 0610  NA 142  K 3.8  CL 109  CO2 24  GLUCOSE 119*  BUN 16  CREATININE 0.70  CALCIUM 8.7    PT/INR: No results found for this basename: LABPROT,INR in the last 72 hours ABG:  INR: Will add last result for INR, ABG once components are confirmed Will add last 4 CBG results once components are confirmed  Assessment/Plan:  1. CV - PAF.SR this am.Contniue Amiodarone,Lopressor, Lovenox. Not a good Coumadin candidate. 2.  Pulmonary - Encourage incentive spirometer. 3.HGA1C pre op 5.8. Will need further follow up as outpatient. 4.  Acute blood loss anemia - Last H/H stable at 8.6/26.4. 5.Remove EPW, CT sutures, and staples. 6.Possible SNF vs. home with [facility]  today/in am.   ZIMMERMAN,DONIELLE MPA-C 04/10/2011

## 2011-04-10 NOTE — Progress Notes (Signed)
Pt has accepted bed at [facility]. Will d/c today at 1400.  Baxter Flattery, MSW [phone]

## 2011-04-10 NOTE — Progress Notes (Signed)
EPW DC'd per MD order and protocol.  Wires intact, no ectopy or bleeding noted.  Pt tolerated well and instructed him and family for him to remain in bed x 1 hr.  Staples on RLE incisions also removed per MD order, R groin staples every other one removed.  All incisions painted with betadine.  Will continue to monitor closely. Ave Filter

## 2011-04-10 NOTE — Progress Notes (Signed)
Patient and his wife were shown Recovering From Heart Surgery video.  Will continue to monitor.

## 2011-04-10 NOTE — Progress Notes (Signed)
CSW has called [facility] to transport pt to [facility]. RN advised. CSW signing off.  Baxter Flattery, MSW [phone]

## 2011-04-17 ENCOUNTER — Telehealth: Payer: Self-pay

## 2011-04-17 NOTE — Telephone Encounter (Signed)
[facility] called to inform patient is scheduled to come in tomorrow for B12. They can give the patient his B12 at the nursing home, would need either verbal order or written order. Please advise call Corrie Dandy at [phone] fax number [phone]

## 2011-04-17 NOTE — Telephone Encounter (Signed)
Nursing home informed

## 2011-04-17 NOTE — Telephone Encounter (Signed)
Verbal is ok.

## 2011-04-19 ENCOUNTER — Ambulatory Visit: Payer: Medicare Other

## 2011-04-24 ENCOUNTER — Other Ambulatory Visit: Payer: Self-pay | Admitting: Surgery

## 2011-04-24 DIAGNOSIS — I2510 Atherosclerotic heart disease of native coronary artery without angina pectoris: Secondary | ICD-10-CM

## 2011-04-25 ENCOUNTER — Ambulatory Visit: Payer: Self-pay | Admitting: Surgery

## 2011-04-26 ENCOUNTER — Other Ambulatory Visit: Payer: Self-pay | Admitting: Surgery

## 2011-04-26 ENCOUNTER — Ambulatory Visit: Payer: Medicare Other | Admitting: Internal Medicine

## 2011-04-26 DIAGNOSIS — I2510 Atherosclerotic heart disease of native coronary artery without angina pectoris: Secondary | ICD-10-CM

## 2011-05-01 ENCOUNTER — Ambulatory Visit
Admission: RE | Admit: 2011-05-01 | Discharge: 2011-05-01 | Disposition: A | Discharge status: Home / Self Care | Payer: Medicare Other | Source: Ambulatory Visit | Attending: Surgery | Admitting: Surgery

## 2011-05-01 ENCOUNTER — Ambulatory Visit (INDEPENDENT_AMBULATORY_CARE_PROVIDER_SITE_OTHER): Payer: Self-pay | Admitting: Physician Assistant

## 2011-05-01 VITALS — BP 136/68 | HR 70 | Resp 20 | Ht 71.0 in | Wt 194.0 lb

## 2011-05-01 DIAGNOSIS — Z951 Presence of aortocoronary bypass graft: Secondary | ICD-10-CM

## 2011-05-01 DIAGNOSIS — I2510 Atherosclerotic heart disease of native coronary artery without angina pectoris: Secondary | ICD-10-CM

## 2011-05-01 NOTE — Progress Notes (Signed)
HPI:  Patient returns for routine postoperative follow-up having undergone CABGx 3 on 04/11/2011 by Dr. Laneta Simmers. The patient's early postoperative recovery while in the hospital was notable for post op afib and some dysphagia. Since hospital discharge the patient reports he feels fatigued and sometimes "feels as though his head is foggy". He denies any chest pain, shortness of breath, fever, chills, visual changes, or weakness in upper or lower extremities.   Current Outpatient Prescriptions  Medication Sig Dispense Refill  . albuterol (PROAIR HFA) 108 (90 BASE) MCG/ACT inhaler Inhale 2 puffs into the lungs 4 (four) times daily as needed. For shortness of breath      . amiodarone (PACERONE) 400 MG tablet Take 200 mg by mouth daily. For 7 days then 400 mg once daily      . aspirin 81 MG tablet Take 81 mg by mouth daily.      . beclomethasone (QVAR) 80 MCG/ACT inhaler Inhale 2 puffs into the lungs 2 (two) times daily.      . ferrous gluconate (FERGON) 324 MG tablet Take 1 tablet (324 mg total) by mouth 2 (two) times daily with a meal.  60 tablet  1  . guaifenesin (HUMIBID E) 400 MG TABS Take 400 mg by mouth 2 (two) times daily as needed. For cough      . LORazepam (ATIVAN) 1 MG tablet Take 1 mg by mouth 2 (two) times daily.        . metoprolol tartrate (LOPRESSOR) 25 MG tablet Take 1 tablet (25 mg total) by mouth 2 (two) times daily.  60 tablet  1  . mirtazapine (REMERON) 15 MG tablet Take 15 mg by mouth at bedtime.      Marland Kitchen omeprazole (PRILOSEC) 20 MG capsule Take 20 mg by mouth daily.      Marland Kitchen zolpidem (AMBIEN) 5 MG tablet Take 5 mg by mouth at bedtime as needed.      Vital Signs: BP 136/68, Rr 20, HR 70, O2 sat 96% on room air  Physical Exam: Cardiovascular: Regular rate and rhythm; S1-S2 without any murmurs, gallops, or rub. Pulmonary: Clear to auscultation bilaterally; no rales, wheezes, rhonchi. Abdomen: Soft, nontender, bowel sounds present. Extremities: No cyanosis, clubbing, or  edema. Wounds: Clean, dry well-healed. No erythema or drainage. Small eschar removed from left lateral chest tube site.   Diagnostic Tests: PA and lateral chest x-ray done today shows no pneumothorax, bibasilar atelectais, small right pleural effusion, and emphysematous changes.  Impression and Plan: Overall Mr. Matthew Bridges continues to make steady progress. His medications, he was instructed he may discontinue the Humibid as he had no longer has any cough. He has not seen Dr. Eden Emms in follow up yet, but  apparently has an appointment to see him this Wednesday.Hopefully, at that time the amiodarone will be decreased. He was instructed to continue on Lopressor 25 mg by mouth 2 times daily as well as enteric-coated aspirin 325 mg po daily. In addition, he was started on Crestor 10 mg by mouth in the evening. Per his wife's request, I called this in to [facility] pharmacy. His hemoglobin A1C preop is 5.8. He was instructed to call Dr. Barrington Ellison to make a followup appointment regarding this. He was to continue with sternal precautions i.e. no lifting more than 10 pounds for the next 4-6 weeks. Speech therapy and physical therapy are still working with him at home. He does not wish to participate in cardiac rehabilitation at this time. I instructed him not to begin driving until he  begins feeling stronger. When he is feeling better, he may then begin driving short distances i.e. 30 minutes or less during the day and gradually increase his frequency and duration as tolerates at that time.He was told that he is very deconditioned and that he should hopefully begin feeling better  over the next couple of weeks.  He will see Dr. Laneta Simmers on a when necessary basis.

## 2011-05-09 ENCOUNTER — Telehealth: Payer: Self-pay

## 2011-05-09 NOTE — Telephone Encounter (Signed)
Ok for verbal 

## 2011-05-09 NOTE — Telephone Encounter (Signed)
Lyla Son called requesting verbal order for a speech consult because pt is complaining of difficulty swallowing.

## 2011-05-09 NOTE — Telephone Encounter (Signed)
Called left message to call back 

## 2011-05-09 NOTE — Telephone Encounter (Signed)
CAlled Matthew Bridges informed of verbal ok.

## 2011-05-10 ENCOUNTER — Other Ambulatory Visit: Payer: Self-pay | Admitting: *Deleted

## 2011-05-10 ENCOUNTER — Telehealth: Payer: Self-pay | Admitting: Cardiovascular Disease

## 2011-05-10 ENCOUNTER — Telehealth: Payer: Self-pay

## 2011-05-10 DIAGNOSIS — G8918 Other acute postprocedural pain: Secondary | ICD-10-CM

## 2011-05-10 MED ORDER — TRAMADOL HCL 50 MG PO TABS: 50.0000 mg | ORAL_TABLET | Freq: Four times a day (QID) | ORAL | Status: AC | PRN

## 2011-05-10 NOTE — Telephone Encounter (Signed)
OT informed

## 2011-05-10 NOTE — Telephone Encounter (Signed)
INFORMED WIFE  TO CALL PMD OR MAYBE DR Laneta Simmers WOULD FILL REQUESTED MEDS.PT'S WIFE  AWARE  DR Eden Emms ONLY FILLS HEART RELATED  MEDS VEBALIZED UNDERSTANDING./CY

## 2011-05-10 NOTE — Telephone Encounter (Signed)
Ok for verbal 

## 2011-05-10 NOTE — Telephone Encounter (Signed)
Called left message to call back 

## 2011-05-10 NOTE — Telephone Encounter (Signed)
OT evaluation has been completed, OT is requesting verbal for 1 additional visit to get the proper DME for shower transfer

## 2011-05-10 NOTE — Telephone Encounter (Signed)
New msg Pt's wife called and wanted refill lorazapam  and tramadol -50 mg She said her pcp hasnt returned her call Please let her know

## 2011-05-11 ENCOUNTER — Telehealth: Payer: Self-pay

## 2011-05-11 MED ORDER — ALPRAZOLAM 0.5 MG PO TBDP: 0.5000 mg | ORAL_TABLET | Freq: Two times a day (BID) | ORAL | Status: DC | PRN

## 2011-05-11 NOTE — Telephone Encounter (Signed)
Pharmacy requesting refill on Ativan. Also informed patient just out of [facility] and was given alprazolam 0.5 mg there.

## 2011-05-11 NOTE — Telephone Encounter (Signed)
Done hardcopy to robin  

## 2011-05-12 ENCOUNTER — Telehealth: Payer: Self-pay

## 2011-05-12 NOTE — Telephone Encounter (Signed)
Pharmacy states the patients wife is requesting a prescription for Ativan 0.5 mg that the patient was given in the hospital at [facility] from Dr. Evlyn Kanner. Wife is afraid to put on xanax rx sent in today (dissolvable tablet) due to heart problems. Please advise

## 2011-05-12 NOTE — Telephone Encounter (Signed)
Faxed hardcopy to pharmacy. 

## 2011-05-12 NOTE — Telephone Encounter (Signed)
These are essentially the same type medications, with the same side effect risks  Neither have increased heart problem risk  OK to cont med as prescribed, since they work the same way, with the same type risk of side effect

## 2011-05-12 NOTE — Telephone Encounter (Signed)
Pharmacy informed of MD's instructions 

## 2011-05-16 ENCOUNTER — Telehealth: Payer: Self-pay

## 2011-05-16 MED ORDER — LORAZEPAM 1 MG PO TABS: 1.0000 mg | ORAL_TABLET | Freq: Two times a day (BID) | ORAL | Status: DC | PRN

## 2011-05-16 NOTE — Telephone Encounter (Signed)
Patients wife called to inform patient cannot take Xanax and would like Lorazepman, please advise

## 2011-05-16 NOTE — Telephone Encounter (Signed)
Done hardcopy to robin  

## 2011-05-16 NOTE — Telephone Encounter (Signed)
Faxed hardcopy to pharmacy. 

## 2011-05-17 ENCOUNTER — Telehealth: Payer: Self-pay

## 2011-05-17 ENCOUNTER — Telehealth: Payer: Self-pay | Admitting: Cardiovascular Disease

## 2011-05-17 NOTE — Telephone Encounter (Signed)
PT'S WIFE CALLED WITH CONCERNS  RE PT'S HR  PER NURSE THAT VISITED ON MON  HR WAS 52 AND THEN TODAY WHEN  PHYSICAL THERAPISTS VISITED WAS 54  WITH B/P 124/57 NO C/O DIZZINESS  FEELS  FINE OTHER THAN SOME FATIGUE  CURRENTLY  IS TAKING  LOPRESSOR 25 MG BID  INSTRUCTED TO CONTINUE TO MONITOR B/P AND HEART RATE  IF HEART  RATE  GETS BELOW 50 TO HOLD DOSE OF LOPRESSOR HAS F/U APPT ON 05/23/11

## 2011-05-17 NOTE — Telephone Encounter (Signed)
New msg: Pt wife calling wanting to speak with nurse/MD regarding pt HR.  Please return pt call to disucss further.

## 2011-05-17 NOTE — Telephone Encounter (Signed)
Home health called to inform the patients pulse has been running in the 50's, was 52 this am. The patient was prescribed Metoprolol 25 mg BID by Dr. Evlyn Kanner when leaving the nursing home. The family would like the patient to stop the metoprolol as thinks may be causing the low pulse. Call back number for Maureen Ralphs with [facility] [phone]

## 2011-05-17 NOTE — Telephone Encounter (Signed)
Informed of MD's instructions.

## 2011-05-17 NOTE — Telephone Encounter (Signed)
Called left message to call back 

## 2011-05-17 NOTE — Telephone Encounter (Signed)
Could be related, but no need to do anything if not dizzy or trying to fall down;  HR 50's is what the cardiologist like to see

## 2011-05-18 NOTE — Telephone Encounter (Signed)
PT'S WIFE AWARE TO DECREASE METOPROLOL TO 1/2 TAB BID .Matthew Bridges

## 2011-05-18 NOTE — Telephone Encounter (Signed)
Decrease lopresser to 12.5 bid

## 2011-05-19 ENCOUNTER — Telehealth: Payer: Self-pay

## 2011-05-19 NOTE — Telephone Encounter (Signed)
Home health called to inform the patient refused speech therapy today and they need verbal ok to do therapy next week. Call back number [phone]

## 2011-05-19 NOTE — Telephone Encounter (Signed)
PT called requesting to continue therapy 2/week for 4 weeks. Matthew Bridges states that pt has not met goals this week.

## 2011-05-19 NOTE — Telephone Encounter (Signed)
Joyce advised.

## 2011-05-19 NOTE — Telephone Encounter (Signed)
Ok for verbal 

## 2011-05-22 NOTE — Telephone Encounter (Signed)
Called left message to call back 

## 2011-05-22 NOTE — Telephone Encounter (Signed)
Called informed of MD's verbal ok.

## 2011-05-23 ENCOUNTER — Ambulatory Visit (INDEPENDENT_AMBULATORY_CARE_PROVIDER_SITE_OTHER): Payer: Medicare Other | Admitting: Cardiovascular Disease

## 2011-05-23 ENCOUNTER — Encounter: Payer: Self-pay | Admitting: Cardiovascular Disease

## 2011-05-23 DIAGNOSIS — Z951 Presence of aortocoronary bypass graft: Secondary | ICD-10-CM

## 2011-05-23 DIAGNOSIS — E785 Hyperlipidemia, unspecified: Secondary | ICD-10-CM

## 2011-05-23 DIAGNOSIS — I4891 Unspecified atrial fibrillation: Secondary | ICD-10-CM

## 2011-05-23 DIAGNOSIS — I480 Paroxysmal atrial fibrillation: Secondary | ICD-10-CM

## 2011-05-23 DIAGNOSIS — R251 Tremor, unspecified: Secondary | ICD-10-CM

## 2011-05-23 DIAGNOSIS — R259 Unspecified abnormal involuntary movements: Secondary | ICD-10-CM

## 2011-05-23 HISTORY — DX: Tremor, unspecified: R25.1

## 2011-05-23 HISTORY — DX: Paroxysmal atrial fibrillation: I48.0

## 2011-05-23 HISTORY — DX: Presence of aortocoronary bypass graft: Z95.1

## 2011-05-23 NOTE — Assessment & Plan Note (Signed)
Continue beta blocker.  Previous heavy drinker  Refer to neuro

## 2011-05-23 NOTE — Assessment & Plan Note (Signed)
Cholesterol is at goal.  Continue current dose of statin and diet Rx.  No myalgias or side effects.  F/U  LFT's in 6 months. No results found for this basename: LDLCALC             

## 2011-05-23 NOTE — Progress Notes (Signed)
Patient ID: Matthew Bridges, male   DOB: 15-Aug-1930, 78 y.o.   MRN: [id number] Debilitated 78 yo post CABG 03/31/11  Post op afib.  Had malaise and low HR.  Lopresser decreased.  Has a tremor that seems to be worse since CABG.  Needs F/U with neuro.  No SSCP. Wants to drive and I think 8 weeks next week he will be fine to drive.  LE edema resolved.  Sternum well healed.  Voice still a little hoarse.  ROS: Denies fever, malais, weight loss, blurry vision, decreased visual acuity, cough, sputum, SOB, hemoptysis, pleuritic pain, palpitaitons, heartburn, abdominal pain, melena, lower extremity edema, claudication, or rash.  All other systems reviewed and negative  General: Affect appropriate Healthy:  appears stated age HEENT: normal Neck supple with no adenopathy JVP normal no bruits no thyromegaly Lungs clear with no wheezing and good diaphragmatic motion Heart:  S1/S2 no murmur, no rub, gallop or click PMI normal Sternum well healed Abdomen: benighn, BS positve, no tenderness, no AAA no bruit.  No HSM or HJR Distal pulses intact with no bruits No edema Neuro non-focal but tremor in LUE and head bobbing Skin warm and dry No muscular weakness   Current Outpatient Prescriptions  Medication Sig Dispense Refill  . albuterol (PROAIR HFA) 108 (90 BASE) MCG/ACT inhaler Inhale 2 puffs into the lungs 4 (four) times daily as needed. For shortness of breath      . amiodarone (PACERONE) 400 MG tablet Take 200 mg by mouth daily. For 7 days then 400 mg once daily      . aspirin 81 MG tablet Take 81 mg by mouth daily.      . beclomethasone (QVAR) 80 MCG/ACT inhaler Inhale 2 puffs into the lungs 2 (two) times daily.      Marland Kitchen LORazepam (ATIVAN) 1 MG tablet Take 1 mg by mouth 2 (two) times daily as needed. 1 and 1/2 tablet daily      . metoprolol tartrate (LOPRESSOR) 25 MG tablet Take 1 tablet (25 mg total) by mouth 2 (two) times daily.  60 tablet  1  . omeprazole (PRILOSEC) 20 MG capsule Take 20 mg by mouth  daily.      . Polyethylene POWD Take by mouth as needed.       . rosuvastatin (CRESTOR) 10 MG tablet Take 10 mg by mouth daily.      . traMADol (ULTRAM) 50 MG tablet Take 50 mg by mouth every 6 (six) hours as needed.        Allergies  Codeine and Penicillins  Electrocardiogram: 04/12/11  SR rate 72 nonspecific ST/T wave changes  Assessment and Plan e

## 2011-05-23 NOTE — Patient Instructions (Addendum)
Your physician wants you to follow-up in:  3 MONTHS WITH DR Haywood Filler will receive a reminder letter in the mail two months in advance. If you don't receive a letter, please call our office to schedule the follow-up appointment. Your physician has recommended you make the following change in your medication: STOP AMIODARONE You have been referred to DR [facility]  DX TREMORS

## 2011-05-23 NOTE — Assessment & Plan Note (Signed)
Resolved.  D/C amiodarone.  Continue low dose beta blocker

## 2011-05-29 ENCOUNTER — Other Ambulatory Visit: Payer: Self-pay

## 2011-05-29 ENCOUNTER — Ambulatory Visit: Payer: Medicare Other | Admitting: Cardiovascular Disease

## 2011-05-29 MED ORDER — METOPROLOL TARTRATE 25 MG PO TABS: 25.0000 mg | ORAL_TABLET | Freq: Two times a day (BID) | ORAL | Status: DC

## 2011-05-30 ENCOUNTER — Other Ambulatory Visit: Payer: Self-pay

## 2011-05-30 MED ORDER — METOPROLOL TARTRATE 25 MG PO TABS: 25.0000 mg | ORAL_TABLET | Freq: Two times a day (BID) | ORAL | Status: DC

## 2011-06-13 ENCOUNTER — Encounter: Payer: Self-pay | Admitting: Internal Medicine

## 2011-06-13 ENCOUNTER — Other Ambulatory Visit (INDEPENDENT_AMBULATORY_CARE_PROVIDER_SITE_OTHER): Payer: Medicare Other

## 2011-06-13 ENCOUNTER — Ambulatory Visit (INDEPENDENT_AMBULATORY_CARE_PROVIDER_SITE_OTHER): Payer: Medicare Other | Admitting: Internal Medicine

## 2011-06-13 VITALS — BP 120/72 | HR 62 | Temp 98.1°F | Ht 71.0 in | Wt 189.5 lb

## 2011-06-13 DIAGNOSIS — D649 Anemia, unspecified: Secondary | ICD-10-CM | POA: Insufficient documentation

## 2011-06-13 DIAGNOSIS — R7309 Other abnormal glucose: Secondary | ICD-10-CM

## 2011-06-13 DIAGNOSIS — K5900 Constipation, unspecified: Secondary | ICD-10-CM

## 2011-06-13 DIAGNOSIS — R634 Abnormal weight loss: Secondary | ICD-10-CM

## 2011-06-13 DIAGNOSIS — R42 Dizziness and giddiness: Secondary | ICD-10-CM

## 2011-06-13 DIAGNOSIS — R7302 Impaired glucose tolerance (oral): Secondary | ICD-10-CM

## 2011-06-13 DIAGNOSIS — Z0000 Encounter for general adult medical examination without abnormal findings: Secondary | ICD-10-CM

## 2011-06-13 HISTORY — DX: Impaired glucose tolerance (oral): R73.02

## 2011-06-13 HISTORY — DX: Abnormal weight loss: R63.4

## 2011-06-13 LAB — TSH: TSH: 0.73 u[IU]/mL (ref 0.35–5.50)

## 2011-06-13 LAB — BASIC METABOLIC PANEL
BUN: 15 mg/dL (ref 6–23)
Calcium: 10 mg/dL (ref 8.4–10.5)
Chloride: 106 mEq/L (ref 96–112)
Creatinine, Ser: 0.9 mg/dL (ref 0.4–1.5)
GFR: 86.59 mL/min (ref >60.00)

## 2011-06-13 LAB — IBC PANEL
Iron: 46 ug/dL (ref 42–165)
Saturation Ratios: 17.7 % — ABNORMAL LOW (ref 20.0–50.0)
Transferrin: 185.7 mg/dL — ABNORMAL LOW (ref 212.0–360.0)

## 2011-06-13 LAB — CBC WITH DIFFERENTIAL/PLATELET
Basophils Relative: 0.5 % (ref 0.0–3.0)
Eosinophils Relative: 8.5 % — ABNORMAL HIGH (ref 0.0–5.0)
Lymphocytes Relative: 26.6 % (ref 12.0–46.0)
Monocytes Relative: 6.2 % (ref 3.0–12.0)
Neutrophils Relative %: 58.2 % (ref 43.0–77.0)
Platelets: 154 10*3/uL (ref 150.0–400.0)
RBC: 4.25 Mil/uL (ref 4.22–5.81)
WBC: 11.7 10*3/uL — ABNORMAL HIGH (ref 4.5–10.5)

## 2011-06-13 LAB — HEPATIC FUNCTION PANEL
Alkaline Phosphatase: 61 U/L (ref 39–117)
Bilirubin, Direct: 0.1 mg/dL (ref 0.0–0.3)
Total Protein: 7.4 g/dL (ref 6.0–8.3)

## 2011-06-13 MED ORDER — MECLIZINE HCL 12.5 MG PO TABS: 12.5000 mg | ORAL_TABLET | Freq: Three times a day (TID) | ORAL | Status: AC | PRN

## 2011-06-13 NOTE — Assessment & Plan Note (Signed)
C/w recurrent vertigo, for meclzine prn,  to f/u any worsening symptoms or concerns; has neuro eval soon for tremor as well

## 2011-06-13 NOTE — Patient Instructions (Signed)
Take all new medications as prescribed - the meclizine Please keep your appointments with your specialists as you have planned - Dr Salome Holmes can take senakot as needed for constipation Please go to LAB in the Basement for the blood and/or urine tests to be done today You will be contacted by phone if any changes need to be made immediately.  Otherwise, you will receive a letter about your results with an explanation. Please return in 6 mo with Lab testing done 3-5 days before

## 2011-06-13 NOTE — Assessment & Plan Note (Signed)
stable overall by hx and exam, most recent data reviewed with pt, and pt to continue medical treatment as before  Lab Results  Component Value Date   WBC 11.7* 06/13/2011   HGB 12.9* 06/13/2011   HCT 39.5 06/13/2011   MCV 93.1 06/13/2011   PLT 154.0 06/13/2011

## 2011-06-13 NOTE — Assessment & Plan Note (Signed)
Likely related to postop decreased appetite, may be improving, check tsh,  to f/u any worsening symptoms or concerns

## 2011-06-13 NOTE — Progress Notes (Signed)
Subjective:    Patient ID: Matthew Bridges, male    DOB: October 13, 1930, 78 y.o.   MRN: [id number]  HPI  Here after eventful recent months, July 2012 episode pna, CABG jan 2013, then 2 wks at [facility] rehab, then last 8 wks at home, declined cardiac rehab but did have PT at rehab; has seen Dr Eden Emms and amio stopped, pt c/o wt loss recent and chart review shows wt loss here sept 2012  - 202 lbs now down to 189, most since the jan 2013 surgury, assoc with lower appetitie, but no dysphagia, abd pain, n/v, other illness; now appetite may be starting to improve; also c/o recurrent dizziness similar to episode over a yr ago better with meclizine; also with recent ongoing constipation , has seen GI and has BM with dulcolox tab when has not had much success with miralax, citrucel.  Soon to see Neuro with tremor.  Wife asks for f/u glucose, last a1c normal jan 2013 on record reviewed with her in room today.  No recent overt bleeding or bruising.  Denies worsening reflux, dysphagia, abd pain, n/v, bowel change or blood except for the above, Overall good compliance with treatment, and good medicine tolerability, including the PPI.  Pain overall controlled with ultram.  No other new compalints.  Denies hyper or hypo thyroid symptoms such as voice, skin or hair change. Past Medical History  Diagnosis Date  . Bronchiectasis   . Nephrolithiasis   . Pneumonia   . Hyperlipidemia   . Insomnia   . Alcohol abuse   . Asthmatic bronchitis   . Pleurisy   . GERD (gastroesophageal reflux disease)   . Diverticulosis     Colonoscopy 2007/Dr. Leone Payor  . Hx of colonic polyp   . H/O hiatal hernia   . Blood transfusion   . Anemia   . 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 Arthritis   . Anxiety   . Shortness of breath   . Coronary artery disease     sees Dr. Eden Emms, saw 1 month ago  . COPD (chronic obstructive pulmonary disease)     sees Dr/ Delford Field, saw last Dec 2012  . S/P CABG (coronary artery bypass  graft)    Past Surgical History  Procedure Date  . Tonsillectomy   . Tonsillectomy   . Cataracts     bilateral  . Kidney stone surgery     removal of kidney stone  . Cardiac catheterization     Friday 4, 2013  . Coronary artery bypass graft 03/31/2011    Procedure: CORONARY ARTERY BYPASS GRAFTING (CABG);  Surgeon: [name], MD;  Location: [facility] OR;  Service: Open Heart Surgery;  Laterality: N/A;  Times three using the mammary and right leg greater saphenous vein.     reports that he quit smoking about 55 years ago. His smoking use included Cigarettes. He has a 15 pack-year smoking history. He has quit using smokeless tobacco. His smokeless tobacco use included Snuff and Chew. He reports that he drinks alcohol. He reports that he does not use illicit drugs. family history includes Alcohol abuse in his father; Breast cancer in his sister; Colon cancer in his brother and father; Heart disease in his mother and other; and Lung cancer in his brother. Allergies  Allergen Reactions  . Codeine Other (See Comments)    Makes him go crazy  . Penicillins Hives   Current Outpatient Prescriptions on File Prior to Visit  Medication Sig Dispense  Refill  . albuterol (PROAIR HFA) 108 (90 BASE) MCG/ACT inhaler Inhale 2 puffs into the lungs 4 (four) times daily as needed. For shortness of breath      . amiodarone (PACERONE) 400 MG tablet Take 200 mg by mouth daily. For 7 days then 400 mg once daily      . aspirin 81 MG tablet Take 81 mg by mouth daily.      . beclomethasone (QVAR) 80 MCG/ACT inhaler Inhale 2 puffs into the lungs 2 (two) times daily.      . metoprolol tartrate (LOPRESSOR) 25 MG tablet Take 1 tablet (25 mg total) by mouth 2 (two) times daily.  60 tablet  6  . omeprazole (PRILOSEC) 20 MG capsule Take 20 mg by mouth daily.      . Polyethylene POWD Take by mouth as needed.       . rosuvastatin (CRESTOR) 10 MG tablet Take 10 mg by mouth daily.      . traMADol (ULTRAM) 50 MG tablet Take 50  mg by mouth every 6 (six) hours as needed.       Review of Systems Review of Systems  Constitutional: Negative for diaphoresis and unexpected weight change.  HENT: Negative for drooling and tinnitus.   Eyes: Negative for photophobia and visual disturbance.  Respiratory: Negative for choking and stridor.   Gastrointestinal: Negative for vomiting and blood in stool.  Genitourinary: Negative for hematuria and decreased urine volume.  Skin: Negative for color change and wound.   Psychiatric/Behavioral: Negative for decreased concentration. The patient is not hyperactive.       Objective:   Physical Exam BP 120/72  Pulse 62  Temp(Src) 98.1 F (36.7 C) (Oral)  Ht 5\' 11"  (1.803 m)  Wt 189 lb 8 oz (85.957 kg)  BMI 26.43 kg/m2  SpO2 96% Physical Exam  VS noted, obese, not ill appearing Constitutional: Pt appears well-developed and well-nourished.  HENT: Head: Normocephalic.  Right Ear: External ear normal.  Left Ear: External ear normal.  Eyes: Conjunctivae and EOM are normal. Pupils are equal, round, and reactive to light.  Neck: Normal range of motion. Neck supple.  Cardiovascular: Normal rate and regular rhythm.   Pulmonary/Chest: Effort normal and breath sounds normal.  Abd:  Soft, NT, non-distended, + BS Skin: Skin is warm. No erythema.  Psychiatric: Pt behavior is normal. Thought content normal. 1+ nervous     Assessment & Plan:

## 2011-06-13 NOTE — Assessment & Plan Note (Signed)
stable overall by hx and exam, most recent data reviewed with pt, and pt to continue medical treatment as before, for glc repeat today Lab Results  Component Value Date   HGBA1C 5.8* 03/29/2011

## 2011-06-13 NOTE — Assessment & Plan Note (Signed)
Ok for senakot prn,  to f/u any worsening symptoms or concerns 

## 2011-06-23 ENCOUNTER — Telehealth: Payer: Self-pay

## 2011-06-23 DIAGNOSIS — R42 Dizziness and giddiness: Secondary | ICD-10-CM

## 2011-06-23 DIAGNOSIS — F22 Delusional disorders: Secondary | ICD-10-CM

## 2011-06-23 NOTE — Telephone Encounter (Signed)
PT called to advise MD that pt does not meet the criteria for home PT services because he is not home bound.

## 2011-06-23 NOTE — Telephone Encounter (Signed)
Ok to refer to outpt PT - done per emr

## 2011-06-26 ENCOUNTER — Telehealth: Payer: Self-pay

## 2011-06-26 NOTE — Telephone Encounter (Signed)
noted 

## 2011-06-26 NOTE — Telephone Encounter (Signed)
[facility]N called to inform MD that pt was not seen last Friday, pt stated that he had a party to go to.

## 2011-06-30 DIAGNOSIS — Z9181 History of falling: Secondary | ICD-10-CM

## 2011-06-30 DIAGNOSIS — J449 Chronic obstructive pulmonary disease, unspecified: Secondary | ICD-10-CM

## 2011-06-30 DIAGNOSIS — I10 Essential (primary) hypertension: Secondary | ICD-10-CM

## 2011-06-30 DIAGNOSIS — I2510 Atherosclerotic heart disease of native coronary artery without angina pectoris: Secondary | ICD-10-CM

## 2011-06-30 DIAGNOSIS — R42 Dizziness and giddiness: Secondary | ICD-10-CM

## 2011-07-05 ENCOUNTER — Encounter: Payer: Self-pay | Admitting: Critical Care Medicine

## 2011-07-05 ENCOUNTER — Other Ambulatory Visit: Payer: Self-pay | Admitting: Cardiovascular Disease

## 2011-07-05 ENCOUNTER — Ambulatory Visit (INDEPENDENT_AMBULATORY_CARE_PROVIDER_SITE_OTHER): Payer: Medicare Other | Admitting: Critical Care Medicine

## 2011-07-05 VITALS — BP 136/66 | HR 57 | Temp 97.3°F | Ht 71.0 in | Wt 173.4 lb

## 2011-07-05 DIAGNOSIS — J8410 Pulmonary fibrosis, unspecified: Secondary | ICD-10-CM

## 2011-07-05 MED ORDER — ROSUVASTATIN CALCIUM 10 MG PO TABS: 10.0000 mg | ORAL_TABLET | Freq: Every day | ORAL | Status: DC

## 2011-07-05 NOTE — Patient Instructions (Signed)
No change in medications. Return in        6 months        

## 2011-07-05 NOTE — Assessment & Plan Note (Signed)
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

## 2011-07-05 NOTE — Progress Notes (Signed)
Subjective:    Patient ID: Matthew Bridges, male    DOB: 10/05/1930, 78 y.o.   MRN: [id number]  HPI  78 y.o.   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  Since that time is mediocre with dyspnea.  No real cough.  Worse at night.  No real wheeze.    Occ chest pain is noted.   Past Medical History  Diagnosis Date  . Bronchiectasis   . Nephrolithiasis   . Pneumonia   . Hyperlipidemia   . Insomnia   . Alcohol abuse   . Asthmatic bronchitis   . Pleurisy   . GERD (gastroesophageal reflux disease)   . Diverticulosis     Colonoscopy 2007/Dr. Leone Payor  . Hx of colonic polyp   . H/O hiatal hernia   . Blood transfusion   . Anemia   . 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 Arthritis   . Anxiety   . Shortness of breath   . Coronary artery disease     sees Dr. Eden Emms, saw 1 month ago  . COPD (chronic obstructive pulmonary disease)     sees Dr/ Delford Field, saw last Dec 2012  . S/P CABG (coronary artery bypass graft)      Family History  Problem Relation Age of Onset  . Heart disease Mother   . Colon cancer Father   . Alcohol abuse Father   . Colon cancer Brother   . Lung cancer Brother   . Breast cancer Sister   . Heart disease Other     maternal side      History   Social History  . Marital Status: Married    Spouse Name: N/A    Number of Children: N/A  . Years of Education: N/A   Occupational History  . Retired    Social History Main Topics  . Smoking status: Former Smoker -- 1.0 packs/day for 10 years    Types: Cigarettes    Quit date: 03/20/1956  . Smokeless tobacco: Former Neurosurgeon    Types: Snuff, Chew    Quit date: 03/20/1948  . Alcohol Use: Yes  . Drug Use: No  . Sexually Active: Not on file   Other Topics Concern  . Not on file   Social History Narrative  . No narrative  on file     Allergies  Allergen Reactions  . Codeine Other (See Comments)    Makes him go crazy  . Penicillins Hives     Outpatient Prescriptions Prior to Visit  Medication Sig Dispense Refill  . albuterol (PROAIR HFA) 108 (90 BASE) MCG/ACT inhaler Inhale 2 puffs into the lungs 4 (four) times daily as needed. For shortness of breath      . aspirin 81 MG tablet Take 81 mg by mouth daily.      . beclomethasone (QVAR) 80 MCG/ACT inhaler Inhale 2 puffs into the lungs 2 (two) times daily.      . metoprolol tartrate (LOPRESSOR) 25 MG tablet Take 1 tablet (25 mg total) by mouth 2 (two) times daily.  60 tablet  6  . omeprazole (PRILOSEC) 20 MG capsule Take 20 mg by mouth daily.      . Polyethylene POWD Take by mouth as needed.       . traMADol (ULTRAM) 50 MG tablet Take 50 mg  by mouth every 6 (six) hours as needed.      . rosuvastatin (CRESTOR) 10 MG tablet Take 10 mg by mouth daily.      Marland Kitchen amiodarone (PACERONE) 400 MG tablet Take 200 mg by mouth daily. For 7 days then 400 mg once daily          Review of Systems  Constitutional:   No  weight loss, night sweats,  Fevers, chills, + fatigue, lassitude. HEENT:   No headaches,  Difficulty swallowing,  Tooth/dental problems,  Sore throat,                No sneezing, itching, ear ache, nasal congestion, post nasal drip,   CV:  Notes  chest pain,  Orthopnea, PND, swelling in lower extremities, anasarca, dizziness, palpitations  GI  No heartburn, indigestion, abdominal pain, nausea, vomiting, diarrhea, change in bowel habits, loss of appetite  Resp: less   shortness of breath with exertion or at rest.  No excess mucus, no   productive cough,   No coughing up of blood.  No  change in color of mucus. No chest wall deformity  Skin: no rash or lesions.  GU: no dysuria, change in color of urine, no urgency or frequency.  No flank pain.  MS:  No joint pain or swelling.  No decreased range of motion.  No back pain.  Psych:  No change in mood or  affect. No depression or anxiety.  No memory loss.     Objective:   Physical Exam  Filed Vitals:   07/05/11 1459  BP: 136/66  Pulse: 57  Temp: 97.3 F (36.3 C)  TempSrc: Oral  Height: 5\' 11"  (1.803 m)  Weight: 173 lb 6.4 oz (78.654 kg)  SpO2: 96%    Gen: Pleasant, well-nourished, in no distress,  normal affect  ENT: No lesions,  mouth clear,  oropharynx clear, no postnasal drip  Neck: No JVD, no TMG, no carotid bruits  Lungs: No use of accessory muscles, no dullness to percussion,  Improved BS  Cardiovascular: RRR, heart sounds normal, no murmur or gallops, no peripheral edema  Abdomen: soft and NT, no HSM,  BS normal  Musculoskeletal: No deformities, no cyanosis or clubbing  Neuro: alert, non focal  Skin: Warm, no lesions or rashes        Assessment & Plan:   Postinflammatory pulmonary fibrosis Hypersensitivity pneumonitis in setting of bronchiectasis.(09/26/10) Neg FOB for pathogens or CA 7/12 Prednisone started 09/26/2010>>tapered to 10mg  7/24>pred increased 40mg x1wk/hold at 30mg  11/02/2010  ESR 72 (7/14)>>88 11/01/2010  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 albuterol (PROAIR HFA) 108 (90 BASE) MCG/ACT inhaler Inhale 2 puffs into the lungs 4 (four) times daily as needed. For shortness of breath      . aspirin 81 MG tablet Take 81 mg by mouth daily.      . beclomethasone (QVAR) 80 MCG/ACT inhaler Inhale 2 puffs into the lungs 2 (two) times daily.      . metoprolol tartrate (LOPRESSOR) 25 MG tablet Take 1 tablet (25 mg total) by mouth 2 (two) times daily.  60 tablet  6  . omeprazole (PRILOSEC) 20 MG capsule  Take 20 mg by  mouth daily.      . Polyethylene POWD Take by mouth as needed.       . traMADol (ULTRAM) 50 MG tablet Take 50 mg by mouth every 6 (six) hours as needed.      Marland Kitchen DISCONTD: rosuvastatin (CRESTOR) 10 MG tablet Take 10 mg by mouth daily.      Marland Kitchen DISCONTD: amiodarone (PACERONE) 400 MG tablet Take 200 mg by mouth daily. For 7 days then 400 mg once daily

## 2011-07-13 ENCOUNTER — Encounter: Payer: Self-pay | Admitting: Neurology

## 2011-07-13 ENCOUNTER — Ambulatory Visit (INDEPENDENT_AMBULATORY_CARE_PROVIDER_SITE_OTHER): Payer: Medicare Other | Admitting: Neurology

## 2011-07-13 VITALS — BP 106/62 | HR 76 | Wt 189.0 lb

## 2011-07-13 DIAGNOSIS — G909 Disorder of the autonomic nervous system, unspecified: Secondary | ICD-10-CM

## 2011-07-13 DIAGNOSIS — R251 Tremor, unspecified: Secondary | ICD-10-CM

## 2011-07-13 DIAGNOSIS — R259 Unspecified abnormal involuntary movements: Secondary | ICD-10-CM

## 2011-07-13 DIAGNOSIS — R1310 Dysphagia, unspecified: Secondary | ICD-10-CM

## 2011-07-13 MED ORDER — CARBIDOPA-LEVODOPA 25-100 MG PO TABS: ORAL_TABLET | ORAL | Status: DC

## 2011-07-13 NOTE — Patient Instructions (Addendum)
Your MRI is scheduled for Tuesday, April 30th at 11:00am.  Please arrive to [facility], first floor admitting by 10:45am  [phone].  We will call you with your appointment for the swallowing test and the heart testing as well.  Sinemet (levodopa/carbidopa) 100/25mg  tabs  take 1/2 tablet at breakfast for 5 days, then take 1/2 tablet at breakfast and dinner for 5 days, then take 1/2 tablet at breakfast, lunch and dinner for 5 days, then take 1 tablet at breakfast, 1/2 at lunch, 1/2 at dinner for 5 days then take 1 tablet at breakfast, 1/2 at lunch, 1 at dinner for 5 days,  then take 1 tablet at breakfast, lunch and dinner from then on.

## 2011-07-13 NOTE — Progress Notes (Signed)
Dear Dr. Jonny Ruiz,  Thank you for having me see Matthew Bridges in consultation today at [facility] for his problem with tremor.  As you may recall, he is a 78 y.o. year old male with a history of ischemic lacunar stroke, CABG x 2 in January of 2013 and alcohol abuse who reports a 3 year history of tremor.  This tremor has been worse in the left hand.  It impedes his ability to eat.  It is progressive, although does think it has gotten worse after his CABG.  He also endorses a head tremor.  He denies loss of dexterity, but is unable to give history about handwriting as "he doesn't write any more".  He does shake when he tried to do up buttons.  He has noted that his walking has gotten worse over the last several years with frequent falls.  He did not quit drinking until about 1 year ago, and would drink multiple beers a day for many years.  He denies the use of coffee.  When he did drink he denies whether the alcohol made his tremor better.  He takes alprazolam for anxiety and does think this helps the tremor.  He only drinks decaf coffee  With respect to the walking problems he always feels imbalanced.  He denies a lack of sensation in his feet.  He "wobbles" from side to side.  He needs his arms to arise from a chair.  He denies significant neck pain.  He is having problems "getting choked up" which is progressive.  He denies acting out his dreams at night.  He only rarely talks in his sleep.  He suffers from dizziness " when he stands up", never when he lies down.    Past Medical History  Diagnosis Date  . Bronchiectasis   . Nephrolithiasis   . Pneumonia   . Hyperlipidemia   . Insomnia   . Alcohol abuse   . Asthmatic bronchitis   . Pleurisy   . GERD (gastroesophageal reflux disease)   . Diverticulosis     Colonoscopy 2007/Dr. Leone Payor  . Hx of colonic polyp   . H/O hiatal hernia   . Blood transfusion   . Anemia   . 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 Arthritis     . Anxiety   . Shortness of breath   . Coronary artery disease     sees Dr. Eden Emms, saw 1 month ago  . COPD (chronic obstructive pulmonary disease)     sees Dr/ Delford Field, saw last Dec 2012  . S/P CABG (coronary artery bypass graft)     Past Surgical History  Procedure Date  . Tonsillectomy   . Tonsillectomy   . Cataracts     bilateral  . Kidney stone surgery     removal of kidney stone  . Cardiac catheterization     Friday 4, 2013  . Coronary artery bypass graft 03/31/2011    Procedure: CORONARY ARTERY BYPASS GRAFTING (CABG);  Surgeon: [name], MD;  Location: [facility] OR;  Service: Open Heart Surgery;  Laterality: N/A;  Times three using the mammary and right leg greater saphenous vein.     History   Social History  . Marital Status: Married    Spouse Name: N/A    Number of Children: N/A  . Years of Education: N/A   Occupational History  . Retired    Social History Main Topics  . Smoking status: Former Smoker -- 1.0  packs/day for 10 years    Types: Cigarettes    Quit date: 03/20/1956  . Smokeless tobacco: Former Neurosurgeon    Types: Snuff, Chew    Quit date: 03/20/1948  . Alcohol Use: Yes  . Drug Use: No  . Sexually Active: None   Other Topics Concern  . None   Social History Narrative  . None    Family History  Problem Relation Age of Onset  . Heart disease Mother   . Colon cancer Father   . Alcohol abuse Father   . Colon cancer Brother   . Lung cancer Brother   . Breast cancer Sister   . Heart disease Other     maternal side   - no history of Parkinson's disease.  Current Outpatient Prescriptions on File Prior to Visit  Medication Sig Dispense Refill  . albuterol (PROAIR HFA) 108 (90 BASE) MCG/ACT inhaler Inhale 2 puffs into the lungs 4 (four) times daily as needed. For shortness of breath      . aspirin 81 MG tablet Take 81 mg by mouth daily.      . beclomethasone (QVAR) 80 MCG/ACT inhaler Inhale 2 puffs into the lungs 2 (two) times daily.      .  metoprolol tartrate (LOPRESSOR) 25 MG tablet Take 1 tablet (25 mg total) by mouth 2 (two) times daily.  60 tablet  6  . omeprazole (PRILOSEC) 20 MG capsule Take 20 mg by mouth daily.      . rosuvastatin (CRESTOR) 10 MG tablet Take 1 tablet (10 mg total) by mouth daily.  30 tablet  6  . traMADol (ULTRAM) 50 MG tablet Take 50 mg by mouth every 6 (six) hours as needed.      . Polyethylene POWD Take by mouth as needed.         Allergies  Allergen Reactions  . Codeine Other (See Comments)    Makes him go crazy  . Penicillins Hives      ROS:  13 systems were reviewed and are notable for some memory problems, he can forget conversations and recent events..  All other review of systems are unremarkable.   Examination:  Filed Vitals:   07/13/11 1452  BP: 122/78  Pulse: 68  Weight: 189 lb (85.73 kg)   SBP dropped to 106 on standing from 118.  In general, well appearing older man, who has decreased facial expression.  Cardiovascular: The patient has a regular rate and rhythm and right carotid bruit.  Fundoscopy:  Disks are flat. Vessel caliber within normal limits.  Mental status:   The patient is oriented to person, place and time. Recent and remote memory are intact. Attention span and concentration are normal. Language including repetition, naming, following commands are intact. Fund of knowledge of current and historical events, as well as vocabulary are normal.  Cranial Nerves: Pupils are equally round and reactive to light. Visual fields full to confrontation.EOMS reveal saccadic intrusion on smooth pursuit  Fast saccades, although hypometric.  Pursuit at zero velocity is impaired bilaterally. Facial sensation and muscles of mastication are intact. Muscles of facial expression reveal mild left facial droop in the lower face. Hearing intact to bilateral finger rub. Tongue protrusion, uvula, palate midline.  Shoulder shrug intact  Motor:  The patient has bradykinesia bilaterally,  mild to moderate worse on the left.  Primarily postural tremor L > R.  No significant rest tremor.  No significant action component.  Bilateral cogwheeling.  Also cogwheeling of the neck.  5/5  muscle strength bilaterally.  Reflexes:  2+ RUE, 3+ LUE, 2+ right patella, 3+ left patella, 2+ ankles, toes down   Coordination:  Normal finger to nose.  Normal H2S  Sensation is decreased to temperature and vibration in his feet, position intact.  Gait and Station is wide based, difficulty arising from seat without arms.  Difficulty standing with feet together eyes open and eyes closed.     Impression/Recs: 1. Tremor - I am concerned the patient has a Parkinson's plus syndrome.  The difficulty swallowing, lightheadedness, postural tremor and wide based gait could possibly represent MSA.  It is atypical he doesn't have symptoms of REM BD, but this can be seen in about 30% of people.  I am going to get an MRI brain for typical signs of MSA.  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  I am also going to get a swallowing eval and a tilt table test as well.  I would like to start the patient on a trial of sinemet 100/25 as MSA can respond in about 50% of the cases. 2.  Dizziness - I think this is due to orthostatic hypotension likely contributed to by his metoprolol, although at the low dose of Toprol it is likely not a significant contributor.  A trial off of Toprol would make sense if possible.  Likely he may need Jobst stockings and increased salt intake if we confirm autonomic problems on tilt table test.    We will see the patient back in 2 months.  Thank you for having [place] see Matthew Bridges in consultation.  Feel free to contact me with any questions.  Lupita Raider Modesto Charon, MD [facility], [place] [address] [place], [place] [address] Phone: [phone] Fax: [phone].

## 2011-07-14 ENCOUNTER — Other Ambulatory Visit (HOSPITAL_COMMUNITY): Payer: Medicare Other

## 2011-07-18 ENCOUNTER — Ambulatory Visit (HOSPITAL_COMMUNITY)
Admission: RE | Admit: 2011-07-18 | Discharge: 2011-07-18 | Disposition: A | Discharge status: Home / Self Care | Payer: Medicare Other | Source: Ambulatory Visit | Attending: Neurology | Admitting: Neurology

## 2011-07-18 DIAGNOSIS — Z8673 Personal history of transient ischemic attack (TIA), and cerebral infarction without residual deficits: Secondary | ICD-10-CM | POA: Insufficient documentation

## 2011-07-18 DIAGNOSIS — Z9181 History of falling: Secondary | ICD-10-CM | POA: Insufficient documentation

## 2011-07-18 DIAGNOSIS — R251 Tremor, unspecified: Secondary | ICD-10-CM

## 2011-07-18 DIAGNOSIS — R259 Unspecified abnormal involuntary movements: Secondary | ICD-10-CM | POA: Insufficient documentation

## 2011-07-18 DIAGNOSIS — R269 Unspecified abnormalities of gait and mobility: Secondary | ICD-10-CM | POA: Insufficient documentation

## 2011-07-20 ENCOUNTER — Ambulatory Visit (HOSPITAL_COMMUNITY)
Admission: RE | Admit: 2011-07-20 | Discharge: 2011-07-20 | Disposition: A | Discharge status: Home / Self Care | Payer: Medicare Other | Source: Ambulatory Visit | Attending: Neurology | Admitting: Neurology

## 2011-07-20 DIAGNOSIS — R1310 Dysphagia, unspecified: Secondary | ICD-10-CM | POA: Insufficient documentation

## 2011-07-20 NOTE — Procedures (Signed)
Objective Swallowing Evaluation: Modified Barium Swallowing Study  Patient Details  Name: Matthew Bridges MRN: [id number] Date of Birth: [birth year]  Today's Date: 07/20/2011 Time: 4098-1191 SLP Time Calculation (min): 30 min  Past Medical History:  Past Medical History  Diagnosis Date  . Bronchiectasis   . Nephrolithiasis   . Pneumonia   . Hyperlipidemia   . Insomnia   . Alcohol abuse   . Asthmatic bronchitis   . Pleurisy   . GERD (gastroesophageal reflux disease)   . Diverticulosis     Colonoscopy 2007/Dr. Leone Payor  . Hx of colonic polyp   . H/O hiatal hernia   . Blood transfusion   . Anemia   . 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 Arthritis   . Anxiety   . Shortness of breath   . Coronary artery disease     sees Dr. Eden Emms, saw 1 month ago  . COPD (chronic obstructive pulmonary disease)     sees Dr/ Delford Field, saw last Dec 2012  . S/P CABG (coronary artery bypass graft)    Past Surgical History:  Past Surgical History  Procedure Date  . Tonsillectomy   . Tonsillectomy   . Cataracts     bilateral  . Kidney stone surgery     removal of kidney stone  . Cardiac catheterization     Friday 4, 2013  . Coronary artery bypass graft 03/31/2011    Procedure: CORONARY ARTERY BYPASS GRAFTING (CABG);  Surgeon: [name], MD;  Location: [facility] OR;  Service: Open Heart Surgery;  Laterality: N/A;  Times three using the mammary and right leg greater saphenous vein.    HPI:  78 yr old seen for outpatient MBS accompanied by wife.  Pt./wife report pt. frequently coughs with liquids who reported difficulty began after CABG 1/13.  Other PMH:  GERD, COPD, pna 7/12, CVA 20 yrs ago, ETOH abuse (stopped 1 yr ago), anxiety, tremor, asthmatic bronchitis, hiatal hernia.      Assessment / Plan / Recommendation Clinical Impression  Dysphagia Diagnosis: Mild pharyngeal phase dysphagia Clinical impression: Pt. exhibited a min-mild motor based pharyngeal phase dysphagia  characterized by flash (sometimes deep) laryngeal penetration.  A chin tuck technique was effective in narrowing the laryngeal vestibule preventing barium from entering laryngeal vestibule.  Mildly reduced tongue base retraction and laryngeal elevation led to min-mild vallecular and pyriform sinsus residue that cleared with a verbal cue to swallow a second time.  It is recommended that pt. continue with a regular texture diet and thin liquids performing a chin tuck with liquids.  Additional precautions include:  alternate liquids and solids, swallow twice after bites/sips, place pills whole in yogurt or pudding.     Treatment Recommendation       Diet Recommendation Regular;Thin liquid   Other  Recommendations     Follow Up Recommendations  None    Frequency and Duration             General HPI: 77 yr old seen for outpatient MBS accompanied by wife.  Pt./wife report pt. frequently coughs with liquids who reported difficulty began after CABG 1/13.  Other PMH:  GERD, COPD, pna 7/12, CVA 20 yrs ago, ETOH abuse (stopped 1 yr ago), anxiety, tremor, asthmatic bronchitis, hiatal hernia.  Type of Study: Modified Barium Swallowing Study Diet Prior to this Study: Regular;Thin liquids Respiratory Status: Room air History of Intubation: Yes Behavior/Cognition: Alert;Cooperative;Pleasant mood Oral Cavity - Dentition: Adequate natural dentition Oral Motor / Sensory Function:  Within functional limits Vision: Functional for self-feeding Patient Positioning: Upright in chair Baseline Vocal Quality: Clear Anatomy: Within functional limits Pharyngeal Secretions: Not observed secondary MBS       Oral Phase     Pharyngeal Phase Pharyngeal Phase: Impaired   Cervical Esophageal Phase Cervical Esophageal Phase: [facility]   Darrow Bussing.Ed CCC-SLP Pager [phone]  07/20/2011  Royce Macadamia 07/20/2011, 2:56 PM

## 2011-07-24 ENCOUNTER — Telehealth: Payer: Self-pay | Admitting: Neurology

## 2011-07-24 NOTE — Telephone Encounter (Signed)
Called and spoke with the patient's wife. Information given as per Dr. Modesto Charon below re: MRI brain. She voiced no additional concerns or issues at this time.

## 2011-07-24 NOTE — Telephone Encounter (Signed)
Message copied by Benay Spice on Mon Jul 24, 2011 10:57 AM ------      Message from: Milas Gain      Created: Sat Jul 22, 2011 10:12 PM       Let Mr and Mrs Bucklin know that MRI revealed old stroke, but no obvious cause of his tremor and difficulty walking.

## 2011-08-17 ENCOUNTER — Ambulatory Visit (INDEPENDENT_AMBULATORY_CARE_PROVIDER_SITE_OTHER): Payer: Medicare Other | Admitting: Internal Medicine

## 2011-08-17 ENCOUNTER — Encounter: Payer: Self-pay | Admitting: Internal Medicine

## 2011-08-17 VITALS — BP 116/68 | HR 69 | Ht 71.0 in | Wt 188.8 lb

## 2011-08-17 DIAGNOSIS — R42 Dizziness and giddiness: Secondary | ICD-10-CM

## 2011-08-17 NOTE — Assessment & Plan Note (Signed)
He has positional dizziness that is unassociated on 5 minute orthostatic vital signs with changes in blood pressure or heart rate. It is possible that a tilt table test would be more revealing, however, the presence of significant symptoms in the absence of documented changes even today would suggest that the mechanism is not systemic blood pressure and/or heart rate control. There are some patients who have abnormal cerebral vaso-regulation which is positional; tilt table testing was not elucidate that.  I told the family that I don't think tilt table testing will be particularly informative I will discuss this with Dr. Modesto Charon and see what he would like me to do

## 2011-08-17 NOTE — Progress Notes (Signed)
HPI  Matthew Bridges is a 78 y.o. male Is seen mistakenly. He thought he was being referred for tilt table testing. He comes frustrated and annoyed that this is not the purpose of this visit.  The concern drive to and from Dr. Nash Dimmer notes is that he has Parkinson's and dizziness with standing and the question is there are no autonomic component to it. His standing it is frequently associated with dizziness; it is not aggravated by showers. It seems to be relieved by sitting. Neurological evaluation has been on revealing.  He is status post bypass surgery in January that was complicated by atrial fibrillation he was treated transiently with amiodarone  Past Medical History  Diagnosis Date  . Bronchiectasis   . Nephrolithiasis   . Pneumonia   . Hyperlipidemia   . Insomnia   . Alcohol abuse   . Asthmatic bronchitis   . Pleurisy   . GERD (gastroesophageal reflux disease)   . Diverticulosis     Colonoscopy 2007/Dr. Leone Payor  . Hx of colonic polyp   . H/O hiatal hernia   . Blood transfusion   . Anemia   . 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 Arthritis   . Anxiety   . Shortness of breath   . Coronary artery disease     sees Dr. Eden Emms, saw 1 month ago  . COPD (chronic obstructive pulmonary disease)     sees Dr/ Delford Field, saw last Dec 2012  . S/P CABG (coronary artery bypass graft)     Past Surgical History  Procedure Date  . Tonsillectomy   . Tonsillectomy   . Cataracts     bilateral  . Kidney stone surgery     removal of kidney stone  . Cardiac catheterization     Friday 4, 2013  . Coronary artery bypass graft 03/31/2011    Procedure: CORONARY ARTERY BYPASS GRAFTING (CABG);  Surgeon: [name], MD;  [facility];  Service: Open Heart Surgery;  Laterality: N/A;  Times three using the mammary and right leg greater saphenous vein.     Current Outpatient Prescriptions  Medication Sig Dispense Refill  . albuterol (PROAIR HFA) 108 (90 BASE)  MCG/ACT inhaler Inhale 2 puffs into the lungs 4 (four) times daily as needed. For shortness of breath      . aspirin 81 MG tablet Take 81 mg by mouth daily.      . beclomethasone (QVAR) 80 MCG/ACT inhaler Inhale 2 puffs into the lungs 2 (two) times daily.      . carbidopa-levodopa (SINEMET) 25-100 MG per tablet increase to 1 tablet three times a day as directed  90 tablet  2  . LORazepam (ATIVAN) 0.5 MG tablet Take 1 mg by mouth. 1/2 bid      . metoprolol tartrate (LOPRESSOR) 25 MG tablet Take 1 tablet (25 mg total) by mouth 2 (two) times daily.  60 tablet  6  . omeprazole (PRILOSEC) 20 MG capsule Take 20 mg by mouth daily.      . Polyethylene POWD Take by mouth as needed.       . rosuvastatin (CRESTOR) 10 MG tablet Take 1 tablet (10 mg total) by mouth daily.  30 tablet  6  . traMADol (ULTRAM) 50 MG tablet Take 50 mg by mouth every 6 (six) hours as needed.        Allergies  Allergen Reactions  . Codeine Other (See Comments)    Makes him go crazy  .  Penicillins Hives    Review of Systems negative except from HPI and PMH  Physical Exam BP 116/68  Pulse 69  Ht 5\' 11"  (1.803 m)  Wt 188 lb 12.8 oz (85.639 kg)  BMI 26.33 kg/m2 Well developed and well nourished in no acute distress HENT normal E scleral and icterus clear Neck Supple JVP flat; carotids brisk and full Clear to ausculation Regular rate and rhythm, no murmurs gallops or rub Soft with active bowel sounds No clubbing cyanosis Trace Edema Alert and oriented, tremor and wide-based gait Skin Warm and Dry    Assessment and  Plan

## 2011-08-21 ENCOUNTER — Other Ambulatory Visit: Payer: Self-pay

## 2011-08-21 MED ORDER — LORAZEPAM 0.5 MG PO TABS: ORAL_TABLET | ORAL | Status: DC

## 2011-08-21 NOTE — Telephone Encounter (Signed)
Done hardcopy to robin  

## 2011-08-21 NOTE — Telephone Encounter (Signed)
Faxed hardcopy to pharmacy. 

## 2011-08-28 ENCOUNTER — Ambulatory Visit: Payer: Medicare Other | Admitting: Cardiovascular Disease

## 2011-09-11 ENCOUNTER — Telehealth: Payer: Self-pay | Admitting: Internal Medicine

## 2011-09-11 NOTE — Telephone Encounter (Signed)
Patient had open heart surgery in January and has had problems with constipation since.  His wife reports that he doesn't have the urge to have a BM.  He also has some weight loss.  He will come in and see Dr Leone Payor 09/12/11 1:45

## 2011-09-12 ENCOUNTER — Encounter: Payer: Self-pay | Admitting: Internal Medicine

## 2011-09-12 ENCOUNTER — Ambulatory Visit (INDEPENDENT_AMBULATORY_CARE_PROVIDER_SITE_OTHER): Payer: Medicare Other | Admitting: Internal Medicine

## 2011-09-12 VITALS — BP 108/50 | HR 72 | Ht 71.0 in | Wt 189.0 lb

## 2011-09-12 DIAGNOSIS — K5900 Constipation, unspecified: Secondary | ICD-10-CM

## 2011-09-12 NOTE — Patient Instructions (Addendum)
Hold your Crestor and call us back 09/25/11 with a report of how your constipation is doing.  You may continue to take the Dulcolax as needed.

## 2011-09-12 NOTE — Progress Notes (Signed)
  Subjective:    Patient ID: Matthew Bridges, male    DOB: 1931-02-03, 78 y.o.   MRN: [id number]  HPI The patient presents with complaints of constipation. He reports not living his bowels for about 5 days unless he takes 3 Dulcolax tabs. That will work producing a bowel movement overnight. He denies significant abdominal pain or rectal bleeding. He has had constipation issues before which I thought were related to narcotic use, he is no longer using narcotics. He tells me that these problems became worse after coronary artery bypass surgery in January 2013. Since that time he has been on Crestor, that is a new medication. He had tried MiraLax on a daily basis but that did not seem to produce a bowel movement. He is that of a stool softener on a daily basis. He reports no difficulty with straining to stool, he just does not get much of an urge to defecate for several days and then he needs to take Dulcolax.  Medications, allergies, past medical history, past surgical history, family history and social history are reviewed and updated in the EMR.  Review of Systems Occasional mild chest pain where the sternotomy is treated with tramadol, successfully.    Objective:   Physical Exam Well-developed elderly white man in no acute distress The abdomen is soft and nontender without organomegaly, mass or hernia. Bowel sounds are present.  Rectal exam reveals normal resting tone, a small amount of stool, no impaction and no mass. He has a good voluntary squeeze. Simulated defecation without cell though shows some abdominal wall contraction but no anorectal descent.       Assessment & Plan:   1. Constipation    He has had this in the past, more recently it has been since his bypass surgery and use of Crestor. Crestor can cause constipation so we'll hold that and ask him to call back in 2 weeks for report. He may have disordered defecation as he did not have an appropriate anorectal descent with  Valsalva. Pending upon what happens with holding the Crestor, would think about adding back more MiraLax, versus trying linaclotide. He can use intermittent Dulcolax for the time being. Further plans pending the trial off Crestor and the results. He had a colonoscopy in 2012. I do not think there is any indication to repeat that at this time. Note that TSH was normal earlier this year.

## 2011-09-14 ENCOUNTER — Other Ambulatory Visit: Payer: Self-pay | Admitting: Cardiology

## 2011-09-14 DIAGNOSIS — I6529 Occlusion and stenosis of unspecified carotid artery: Secondary | ICD-10-CM

## 2011-09-20 ENCOUNTER — Other Ambulatory Visit: Payer: Self-pay

## 2011-09-20 ENCOUNTER — Ambulatory Visit (INDEPENDENT_AMBULATORY_CARE_PROVIDER_SITE_OTHER): Payer: Medicare Other | Admitting: Cardiovascular Disease

## 2011-09-20 ENCOUNTER — Encounter: Payer: Self-pay | Admitting: Cardiovascular Disease

## 2011-09-20 ENCOUNTER — Encounter (INDEPENDENT_AMBULATORY_CARE_PROVIDER_SITE_OTHER): Payer: Medicare Other

## 2011-09-20 VITALS — BP 120/67 | HR 65 | Ht 71.0 in | Wt 188.0 lb

## 2011-09-20 DIAGNOSIS — I6529 Occlusion and stenosis of unspecified carotid artery: Secondary | ICD-10-CM

## 2011-09-20 DIAGNOSIS — E785 Hyperlipidemia, unspecified: Secondary | ICD-10-CM

## 2011-09-20 DIAGNOSIS — I779 Disorder of arteries and arterioles, unspecified: Secondary | ICD-10-CM

## 2011-09-20 DIAGNOSIS — Z951 Presence of aortocoronary bypass graft: Secondary | ICD-10-CM

## 2011-09-20 DIAGNOSIS — F1011 Alcohol abuse, in remission: Secondary | ICD-10-CM

## 2011-09-20 DIAGNOSIS — R42 Dizziness and giddiness: Secondary | ICD-10-CM

## 2011-09-20 HISTORY — DX: Disorder of arteries and arterioles, unspecified: I77.9

## 2011-09-20 NOTE — Assessment & Plan Note (Signed)
Not cardiac vertebrals ok and related to valium use.  ? Parkinsons and personality disorder F/U Dr Jonny Ruiz and Modesto Charon

## 2011-09-20 NOTE — Progress Notes (Signed)
Patient ID: Matthew Bridges, male   DOB: 1930/09/05, 78 y.o.   MRN: [id number] Debilitated 78 yo post CABG 03/31/11 Post op afib. Had malaise and low HR. Lopresser decreased. Has a tremor that seems to be worse since CABG. Needs F/U with neuro. No SSCP. Wants to drive and I think 8 weeks next week he will be fine to drive. LE edema resolved. Sternum well healed. Voice still a little hoarse. Known carotid disease.  Duplex done today and reviewed  40-59% RICA and 60-79% LICA  Systolic velocity on left is 3.38cm  His dizzyness is related to lorazapam that he has taken for 15 years.  He saw Dr Matthew Bridges who did not think tilt table testing needed.  He has a very labile personality with outbursts of agression and I am not sure that his psychiatric condition has been fully evaluated.    ROS: Denies fever, malais, weight loss, blurry vision, decreased visual acuity, cough, sputum, SOB, hemoptysis, pleuritic pain, palpitaitons, heartburn, abdominal pain, melena, lower extremity edema, claudication, or rash.  All other systems reviewed and negative  General: Affect appropriate Healthy:  appears stated age HEENT: normal Neck supple with no adenopathy JVP normal left  bruits no thyromegaly Lungs clear with no wheezing and good diaphragmatic motion Heart:  S1/S2 no murmur, no rub, gallop or click PMI normal Abdomen: benighn, BS positve, no tenderness, no AAA no bruit.  No HSM or HJR Distal pulses intact with no bruits No edema Neuro non-focal UE and chin tremor Skin warm and dry No muscular weakness   Current Outpatient Prescriptions  Medication Sig Dispense Refill  . albuterol (PROAIR HFA) 108 (90 BASE) MCG/ACT inhaler Inhale 2 puffs into the lungs 4 (four) times daily as needed. For shortness of breath      . aspirin 81 MG tablet Take 81 mg by mouth daily.      . beclomethasone (QVAR) 80 MCG/ACT inhaler Inhale 2 puffs into the lungs 2 (two) times daily.      . bisacodyl (DULCOLAX) 5 MG EC tablet  Take 15 mg by mouth daily as needed.      [name] Calcium (STOOL SOFTENER PO) Take 1 capsule by mouth daily.      Marland Kitchen LORazepam (ATIVAN) 0.5 MG tablet 1 - 2 tabs by mouth per day as needed for anxiety  60 tablet  0  . metoprolol tartrate (LOPRESSOR) 25 MG tablet Take 1 tablet (25 mg total) by mouth 2 (two) times daily.  60 tablet  6  . omeprazole (PRILOSEC) 20 MG capsule Take 20 mg by mouth daily.      . traMADol (ULTRAM) 50 MG tablet Take 50 mg by mouth every 6 (six) hours as needed.        Allergies  Codeine and Penicillins  Electrocardiogram:  NSR rate 72 nonspecific ST/T wave changes  Assessment and Plan

## 2011-09-20 NOTE — Assessment & Plan Note (Signed)
Stable with no angina and good activity level.  Continue medical Rx  

## 2011-09-20 NOTE — Assessment & Plan Note (Signed)
Left ICA 60-79% stenosis.  No TIA symptoms.  Continue antiplatelet Rx and F/U carotid duplex in 6 months   

## 2011-09-20 NOTE — Assessment & Plan Note (Signed)
He needs further w/u regarding peronality disorder and valium use.

## 2011-09-20 NOTE — Patient Instructions (Addendum)
Your physician wants you to follow-up in: YEAR WITH DR Haywood Filler will receive a reminder letter in the mail two months in advance. If you don't receive a letter, please call our office to schedule the follow-up appointment. Your physician recommends that you continue on your current medications as directed. Please refer to the Current Medication list given to you today. Your physician has requested that you have a carotid duplex. This test is an ultrasound of the carotid arteries in your neck. It looks at blood flow through these arteries that supply the brain with blood. Allow one hour for this exam. There are no restrictions or special instructions. IN 6 MONTHS  CAROTID STENOSIS  NEEDS F/U WITH DR Modesto Charon  RE PARKISONS

## 2011-09-20 NOTE — Assessment & Plan Note (Signed)
Cholesterol is at goal.  Continue current dose of statin and diet Rx.  No myalgias or side effects.  F/U  LFT's in 6 months. No results found for this basename: LDLCALC             

## 2011-09-22 ENCOUNTER — Telehealth: Payer: Self-pay | Admitting: General Practice

## 2011-09-22 MED ORDER — LORAZEPAM 0.5 MG PO TABS: ORAL_TABLET | ORAL | Status: DC

## 2011-09-22 NOTE — Telephone Encounter (Signed)
i printed 

## 2011-09-22 NOTE — Telephone Encounter (Signed)
Rx faxed to [facility]-pt's spouse informed rx sent.

## 2011-09-22 NOTE — Telephone Encounter (Signed)
Patient call to get refill on Lorezapam to be called into [facility] on [address] and [address].

## 2011-09-26 ENCOUNTER — Telehealth: Payer: Self-pay | Admitting: Internal Medicine

## 2011-09-26 MED ORDER — LINACLOTIDE 145 MCG PO CAPS: 145.0000 ug | ORAL_CAPSULE | Freq: Every day | ORAL | Status: DC

## 2011-09-26 NOTE — Telephone Encounter (Signed)
Patient notified.  They will come pick up samples of linzess

## 2011-09-26 NOTE — Telephone Encounter (Signed)
Patient has not had any improvement with constipation.  She has tried all the recommendations given to him at the last office with no improvement.  The only thing that works is dulcolax.  He took two last Friday and had a large BM, but hasn't had another since.  Dr. Leone Payor please advise

## 2011-09-26 NOTE — Telephone Encounter (Signed)
Take dulcolax today Linzess samples 145 mcg daily can be tried for 1-2 weeks with follow-up in 2 weeks - if worse before then call back  Ok to take the dulcolax while on Linzess if he needs it

## 2011-09-28 ENCOUNTER — Telehealth: Payer: Self-pay | Admitting: Internal Medicine

## 2011-09-28 NOTE — Telephone Encounter (Signed)
Called informed the patients wife and she agreed to schedule appt. For the patient.

## 2011-09-28 NOTE — Telephone Encounter (Signed)
Patient dropped of [facility] form for examination of housebound status and permanent need for aid.  Per Dr.John, the patient needs ov to complete paperwork.  Called patient, no answer on phone line.  Will try calling back soon.   Paperwork is at [employer] in "open forms" file.

## 2011-10-02 ENCOUNTER — Encounter: Payer: Self-pay | Admitting: Internal Medicine

## 2011-10-02 ENCOUNTER — Ambulatory Visit (INDEPENDENT_AMBULATORY_CARE_PROVIDER_SITE_OTHER): Payer: Medicare Other | Admitting: Internal Medicine

## 2011-10-02 VITALS — BP 118/80 | HR 66 | Temp 97.7°F | Ht 71.0 in | Wt 189.0 lb

## 2011-10-02 DIAGNOSIS — N400 Enlarged prostate without lower urinary tract symptoms: Secondary | ICD-10-CM

## 2011-10-02 DIAGNOSIS — M545 Low back pain, unspecified: Secondary | ICD-10-CM

## 2011-10-02 DIAGNOSIS — R4586 Emotional lability: Secondary | ICD-10-CM

## 2011-10-02 DIAGNOSIS — M519 Unspecified thoracic, thoracolumbar and lumbosacral intervertebral disc disorder: Secondary | ICD-10-CM

## 2011-10-02 DIAGNOSIS — G8929 Other chronic pain: Secondary | ICD-10-CM

## 2011-10-02 HISTORY — DX: Other chronic pain: G89.29

## 2011-10-02 HISTORY — DX: Unspecified thoracic, thoracolumbar and lumbosacral intervertebral disc disorder: M51.9

## 2011-10-02 HISTORY — DX: Low back pain, unspecified: M54.50

## 2011-10-02 HISTORY — DX: Benign prostatic hyperplasia without lower urinary tract symptoms: N40.0

## 2011-10-02 HISTORY — DX: Emotional lability: R45.86

## 2011-10-02 MED ORDER — TAMSULOSIN HCL 0.4 MG PO CAPS: 0.4000 mg | ORAL_CAPSULE | Freq: Every day | ORAL | Status: DC

## 2011-10-02 NOTE — Assessment & Plan Note (Signed)
D/w pt today, admits to significnat symtpoms, wife tolerant, he declines further tx or referal to psychiatry today

## 2011-10-02 NOTE — Assessment & Plan Note (Signed)
With fullness of bladder today, delcines urology eval, ok for flomax asd,  to f/u any worsening symptoms or concerns

## 2011-10-02 NOTE — Assessment & Plan Note (Addendum)
Intermittent for 30 yrs, has known lumbar disc dz, last eval yrs ago, with incresaed recent pain and LE pain/weakness but declines further eval or tx at this time, chronic debilitation, form for [facility] filled out

## 2011-10-02 NOTE — Patient Instructions (Addendum)
Take all new medications as prescribed - the flomax (generic) to relax the prostate to help with urinating easier Continue all other medications as before Your form will be finished and sent in Please keep your appointments with your specialists as you have planned Please call if you would like referral to urology if the flomax does not help enough

## 2011-10-15 ENCOUNTER — Encounter: Payer: Self-pay | Admitting: Internal Medicine

## 2011-10-15 NOTE — Progress Notes (Signed)
Subjective:    Patient ID: Matthew Bridges, male    DOB: [birth year], 78 y.o.   MRN: [id number]  HPI  Here to f/u, overall doing ok, has a form related to the [place] he needs filled out related to certification of housebound status.  Pt continues to have recurring LBP without change in severity, bowel or bladder change, fever, wt loss,  worsening LE pain/numbness/weakness, gait change or falls.  Does have worsening BPH symptoms in the past 3-6 months with slower stream, and getting up to 3 times at night.  Pt denies chest pain, increased sob or doe, wheezing, orthopnea, PND, increased LE swelling, palpitations, dizziness or syncope.  Pt denies new neurological symptoms such as new headache, or facial or extremity weakness or numbness   Pt denies polydipsia, polyuria.  Does have significant emotional lability issue as well, wife cont's to be supportive.   Past Medical History  Diagnosis Date  . Bronchiectasis   . Nephrolithiasis   . Pneumonia   . Hyperlipidemia   . Insomnia   . Alcohol abuse   . Asthmatic bronchitis   . Pleurisy   . GERD (gastroesophageal reflux disease)   . Diverticulosis     Colonoscopy 2007/Dr. Leone Payor  . Hx of colonic polyp   . H/O hiatal hernia   . Blood transfusion   . Anemia   . 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 Arthritis   . Anxiety   . Shortness of breath   . Coronary artery disease     sees Dr. Eden Emms, saw 1 month ago  . COPD (chronic obstructive pulmonary disease)     sees Dr/ Delford Field, saw last Dec 2012  . S/P CABG (coronary artery bypass graft)   . Lumbar disc disease 10/02/2011  . Emotional lability 10/02/2011  . Chronic low back pain 10/02/2011  . BPH (benign prostatic hyperplasia) 10/02/2011   Past Surgical History  Procedure Date  . Tonsillectomy   . Cataract extraction     bilateral  . Kidney stone surgery     removal of kidney stone  . Cardiac catheterization     Friday 4, 2013  . Coronary artery bypass graft 03/31/2011   Procedure: CORONARY ARTERY BYPASS GRAFTING (CABG);  Surgeon: [name], MD;  Location: [facility] OR;  Service: Open Heart Surgery;  Laterality: N/A;  Times three using the mammary and right leg greater saphenous vein.   . Colonoscopy Multiple    reports that he quit smoking about 55 years ago. His smoking use included Cigarettes. He has a 10 pack-year smoking history. He quit smokeless tobacco use about 63 years ago. His smokeless tobacco use included Snuff and Chew. He reports that he does not drink alcohol or use illicit drugs. family history includes Alcohol abuse in his father; Breast cancer in his sister; Colon cancer in his brother and father; Heart disease in his mother and other; and Lung cancer in his brother. Allergies  Allergen Reactions  . Codeine Other (See Comments)    Makes him feel dizzy, out of it  . Penicillins Hives   Current Outpatient Prescriptions on File Prior to Visit  Medication Sig Dispense Refill  . albuterol (PROAIR HFA) 108 (90 BASE) MCG/ACT inhaler Inhale 2 puffs into the lungs 4 (four) times daily as needed. For shortness of breath      . aspirin 81 MG tablet Take 81 mg by mouth daily.      . beclomethasone (QVAR) 80 MCG/ACT inhaler Inhale  2 puffs into the lungs 2 (two) times daily.      . bisacodyl (DULCOLAX) 5 MG EC tablet Take 15 mg by mouth daily as needed.      [name] Calcium (STOOL SOFTENER PO) Take 1 capsule by mouth daily.      . Linaclotide (LINZESS) 145 MCG CAPS Take 1 capsule (145 mcg total) by mouth daily.  30 capsule  0  . LORazepam (ATIVAN) 0.5 MG tablet 1 - 2 tabs by mouth per day as needed for anxiety  60 tablet  0  . metoprolol tartrate (LOPRESSOR) 25 MG tablet Take 1 tablet (25 mg total) by mouth 2 (two) times daily.  60 tablet  6  . omeprazole (PRILOSEC) 20 MG capsule Take 20 mg by mouth daily.      . traMADol (ULTRAM) 50 MG tablet Take 50 mg by mouth every 6 (six) hours as needed.       Review of Systems Review of Systems  Constitutional:  Negative for diaphoresis and unexpected weight change.  HENT: Negative for tinnitus.   Eyes: Negative for photophobia and visual disturbance.  Respiratory: Negative for stridor.   Gastrointestinal: Negative for vomiting and blood in stool.  Genitourinary: Negative for hematuria and decreased urine volume.  Musculoskeletal: Negative for gait problem.  Skin: Negative for color change and wound.  Neurological: Negative for tremors and numbness.     Objective:   Physical Exam BP 118/80  Pulse 66  Temp 97.7 F (36.5 C) (Oral)  Ht 5\' 11"  (1.803 m)  Wt 189 lb (85.73 kg)  BMI 26.36 kg/m2  SpO2 97% Physical Exam  VS noted Constitutional: Pt appears well-developed and well-nourished.  HENT: Head: Normocephalic.  Right Ear: External ear normal.  Left Ear: External ear normal.  Eyes: Conjunctivae and EOM are normal. Pupils are equal, round, and reactive to light.  Neck: Normal range of motion. Neck supple.  Cardiovascular: Normal rate and regular rhythm.   Pulmonary/Chest: Effort normal and breath sounds normal.  Abd:  Soft, NT but with palpable nontender bladder fullness suprapubic Neurological: Pt is alert. Not confused Skin: Skin is warm. No erythema. No rash Psychiatric: Pt behavior is normal. Thought content normal. 1+ nervous    Assessment & Plan:

## 2011-10-17 ENCOUNTER — Telehealth: Payer: Self-pay | Admitting: Critical Care Medicine

## 2011-10-17 MED ORDER — AZITHROMYCIN 250 MG PO TABS: ORAL_TABLET | ORAL | Status: DC

## 2011-10-17 MED ORDER — PREDNISONE 10 MG PO TABS: ORAL_TABLET | ORAL | Status: DC
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
## 2011-10-17 NOTE — Telephone Encounter (Signed)
Call in prednisone 10mg  Take 4 tablets daily for 5 days then stop  #20 Call in azithromycin 250mg  Take two once then one daily until gone  #6  Pt needs Ov soon

## 2011-10-17 NOTE — Telephone Encounter (Signed)
Pt aware. appt made and rx sent.Carron Curie, CMA

## 2011-10-19 ENCOUNTER — Other Ambulatory Visit: Payer: Self-pay | Admitting: *Deleted

## 2011-10-19 NOTE — Telephone Encounter (Signed)
R'cd fax from [facility] for refill of Ativan-last written 09/22/2011 #60 with 0 refills-please advise in JWJ's absence. Thank you.

## 2011-10-20 MED ORDER — LORAZEPAM 0.5 MG PO TABS: ORAL_TABLET | ORAL | Status: DC

## 2011-10-20 NOTE — Telephone Encounter (Signed)
i printed 

## 2011-10-20 NOTE — Telephone Encounter (Signed)
Rx faxed to [facility].  

## 2011-10-23 ENCOUNTER — Ambulatory Visit: Payer: Medicare Other | Admitting: Neurology

## 2011-11-16 ENCOUNTER — Other Ambulatory Visit: Payer: Self-pay

## 2011-11-16 MED ORDER — LORAZEPAM 0.5 MG PO TABS: ORAL_TABLET | ORAL | Status: DC

## 2011-11-16 NOTE — Telephone Encounter (Signed)
Done hardcopy to robin  

## 2011-11-17 NOTE — Telephone Encounter (Signed)
Faxed hardcopy to pharmacy. 

## 2011-11-24 ENCOUNTER — Telehealth: Payer: Self-pay | Admitting: Internal Medicine

## 2011-11-24 ENCOUNTER — Ambulatory Visit: Payer: Medicare Other | Admitting: Critical Care Medicine

## 2011-11-24 ENCOUNTER — Telehealth: Payer: Self-pay | Admitting: Critical Care Medicine

## 2011-11-24 NOTE — Telephone Encounter (Signed)
I dont know what to say, I believe I gave it to robin or carolyn who would have forwarded

## 2011-11-24 NOTE — Telephone Encounter (Signed)
Matthew Stager do you remember handling this paperwork?

## 2011-11-24 NOTE — Telephone Encounter (Signed)
Ok with me to double book next week

## 2011-11-24 NOTE — Telephone Encounter (Signed)
Pt wife called stating that the [place] never received her husbands paperwork that was filled out on his appt 10/02/11. Please advise. Thanks.

## 2011-11-24 NOTE — Telephone Encounter (Signed)
I spoke with spouse and she would like to bring pt in on 11/29/11 at 10:00 if possible. I have scheduled this. Nothing further was needed

## 2011-11-24 NOTE — Telephone Encounter (Signed)
I spoke with [name] and she stated pt had to cancel his appt today bc he has a stomach virus-nausea, upset stomach, and feels like diarrhea is getting ready to hit him. She stated he was getting up to come to his appt today but felt so sick to his stomach he couldn't;t. He is requesting to be worked in next week for his follow up. Please advise Dr. Delford Field thanks

## 2011-11-27 NOTE — Telephone Encounter (Signed)
The paperwork was given to Zella Ball (noted in the [place]) thanks!

## 2011-11-28 NOTE — Telephone Encounter (Signed)
Called the patients wife who informed they came in in July with the forms to be completed and we were to mail them.   [facility] states they have not yet received them, I have not seen the forms.  Please advise

## 2011-11-28 NOTE — Telephone Encounter (Signed)
I dont know how to help this further

## 2011-11-29 ENCOUNTER — Ambulatory Visit (INDEPENDENT_AMBULATORY_CARE_PROVIDER_SITE_OTHER): Payer: Medicare Other | Admitting: Critical Care Medicine

## 2011-11-29 ENCOUNTER — Encounter: Payer: Self-pay | Admitting: Critical Care Medicine

## 2011-11-29 ENCOUNTER — Telehealth: Payer: Self-pay | Admitting: *Deleted

## 2011-11-29 ENCOUNTER — Ambulatory Visit (INDEPENDENT_AMBULATORY_CARE_PROVIDER_SITE_OTHER)
Admission: RE | Admit: 2011-11-29 | Discharge: 2011-11-29 | Disposition: A | Discharge status: Home / Self Care | Payer: Medicare Other | Source: Ambulatory Visit | Attending: Critical Care Medicine | Admitting: Critical Care Medicine

## 2011-11-29 VITALS — BP 100/48 | HR 68 | Temp 98.4°F | Ht 71.0 in | Wt 189.0 lb

## 2011-11-29 DIAGNOSIS — J679 Hypersensitivity pneumonitis due to unspecified organic dust: Secondary | ICD-10-CM

## 2011-11-29 DIAGNOSIS — J8410 Pulmonary fibrosis, unspecified: Secondary | ICD-10-CM

## 2011-11-29 MED ORDER — BECLOMETHASONE DIPROPIONATE 80 MCG/ACT IN AERS: INHALATION_SPRAY | RESPIRATORY_TRACT | Status: DC

## 2011-11-29 MED ORDER — ALPRAZOLAM 0.25 MG PO TABS: 0.2500 mg | ORAL_TABLET | Freq: Two times a day (BID) | ORAL | Status: AC | PRN

## 2011-11-29 MED ORDER — PREDNISONE 10 MG PO TABS: ORAL_TABLET | ORAL | Status: DC

## 2011-11-29 NOTE — Patient Instructions (Addendum)
Prednisone 10mg  Take 4 for two days three for two days two for two days one for two days Qvar three puff twice daily for 7days then two puff twice daily Chest xray today Return two weeks for recheck and flu vaccine

## 2011-11-29 NOTE — Telephone Encounter (Signed)
Pt's wife req Rf on Alprazolam 0.5 mg instead of Lorazepam. She states Dr. Modesto Charon wanted him to try to get off of Lorazepam.  She states she requested this through his pharmacy and has not heard back.

## 2011-11-29 NOTE — Telephone Encounter (Signed)
I only see one note per Dr Modesto Charon in April 2013, which did not mention specifically needing to come off benzodiazipines (which is not to say he may have mentioned it at the visit)  OK for xanax lower dose for now, need to avoid higher doses to his age and making confusion worse, and hx of alcohol

## 2011-11-29 NOTE — Telephone Encounter (Signed)
The patient and his wife came by the office to check on [place] paperwork.  Informed per MD instructions that Dr. Jonny Ruiz did complete paperwork and mailed as requested.  MD did state if they had not received yet or not at all would redo paperwork if they would provide another copy.  They agreed to do so.

## 2011-11-29 NOTE — Assessment & Plan Note (Signed)
Recurrent hypersensitivity pneumonitis and lower airway inflammation Plan Prednisone 10mg  Take 4 for two days three for two days two for two days one for two days Qvar three puff twice daily for 7days then two puff twice daily Hold flu vaccine for 2 weeks

## 2011-11-29 NOTE — Telephone Encounter (Signed)
Called the patients wife informed of MD instructions on medication and faxed hardcopy to pharmacy.

## 2011-11-29 NOTE — Progress Notes (Signed)
Quick Note:  Notify the patient that the Xray is stable and no pneumonia No change in medications are recommended. Continue current meds as prescribed at last office visit ______ 

## 2011-11-29 NOTE — Progress Notes (Signed)
Quick Note:  Called, spoke with pt and his wife. Informed them both of cxr results and recs per Dr. Delford Field. They verbalized understanding of this and voiced no further questions/concerns at this time. ______

## 2011-11-29 NOTE — Progress Notes (Signed)
Subjective:    Patient ID: Matthew Bridges, male    DOB: [birth year], 78 y.o.   MRN: [id number]  HPI  78 y.o.   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  Since that time is mediocre with dyspnea.  No real cough.  Worse at night.  No real wheeze.    Occ chest pain is noted.    11/29/2011 Pt notes more cough for 1 month.  Pt is worse at night.  Cough is productive of white mucus. Does not feel congested.  Pt notes some chest discomfort.  No real edema in feet.  Notes some heartburn.  Pt has pain in sternal area.   Past Medical History  Diagnosis Date  . Bronchiectasis   . Nephrolithiasis   . Pneumonia   . Hyperlipidemia   . Insomnia   . Alcohol abuse   . Asthmatic bronchitis   . Pleurisy   . GERD (gastroesophageal reflux disease)   . Diverticulosis     Colonoscopy 2007/Dr. Leone Payor  . Hx of colonic polyp   . H/O hiatal hernia   . Blood transfusion   . Anemia   . 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 Arthritis   . Anxiety   . Shortness of breath   . Coronary artery disease     sees Dr. Eden Emms, saw 1 month ago  . COPD (chronic obstructive pulmonary disease)     sees Dr/ Delford Field, saw last Dec 2012  . S/P CABG (coronary artery bypass graft)   . Lumbar disc disease 10/02/2011  . Emotional lability 10/02/2011  . Chronic low back pain 10/02/2011  . BPH (benign prostatic hyperplasia) 10/02/2011     Family History  Problem Relation Age of Onset  . Heart disease Mother   . Colon cancer Father   . Alcohol abuse Father   . Colon cancer Brother   . Lung cancer Brother   . Breast cancer Sister   . Heart disease Other     maternal side      History   Social History  . Marital Status: Married    Spouse Name: N/A    Number of Children: 3  . Years of Education: N/A   Occupational History    . Retired    Social History Main Topics  . Smoking status: Former Smoker -- 1.0 packs/day for 10 years    Types: Cigarettes    Quit date: 03/20/1956  . Smokeless tobacco: Former Neurosurgeon    Types: Snuff, Chew    Quit date: 03/20/1948  . Alcohol Use: No  . Drug Use: No  . Sexually Active: Not on file   Other Topics Concern  . Not on file   Social History Narrative  . No narrative on file     Allergies  Allergen Reactions  . Codeine Other (See Comments)    Makes him feel dizzy, out of it  . Penicillins Hives     Outpatient Prescriptions Prior to Visit  Medication Sig Dispense Refill  . albuterol (PROAIR HFA) 108 (90 BASE) MCG/ACT inhaler Inhale 2 puffs into the lungs 4 (four) times daily as needed. For shortness of breath      . aspirin 81 MG tablet Take 81 mg by mouth daily.      . bisacodyl (DULCOLAX) 5 MG EC  tablet Take 15 mg by mouth daily as needed.      . metoprolol tartrate (LOPRESSOR) 25 MG tablet Take 1 tablet (25 mg total) by mouth 2 (two) times daily.  60 tablet  6  . omeprazole (PRILOSEC) 20 MG capsule Take 20 mg by mouth daily.      . traMADol (ULTRAM) 50 MG tablet Take 50 mg by mouth every 6 (six) hours as needed.      . beclomethasone (QVAR) 80 MCG/ACT inhaler Inhale 2 puffs into the lungs 2 (two) times daily.      Marland Kitchen LORazepam (ATIVAN) 0.5 MG tablet 1 - 2 tabs by mouth per day as needed for anxiety  60 tablet  0  . Tamsulosin HCl (FLOMAX) 0.4 MG CAPS Take 1 capsule (0.4 mg total) by mouth daily.  90 capsule  3  . azithromycin (ZITHROMAX) 250 MG tablet 2 today then one daily until gone  6 tablet  0  . Docusate Calcium (STOOL SOFTENER PO) Take 1 capsule by mouth daily.      . Linaclotide (LINZESS) 145 MCG CAPS Take 1 capsule (145 mcg total) by mouth daily.  30 capsule  0  . LORazepam (ATIVAN) 0.5 MG tablet 1 - 2 tabs by mouth per day as needed for anxiety  60 tablet  2  . predniSONE (DELTASONE) 10 MG tablet Take 4 tablets a day x 5 days, then stop.  20 tablet  0       Review of Systems  Constitutional:   No  weight loss, night sweats,  Fevers, chills, + fatigue, lassitude. HEENT:   No headaches,  Difficulty swallowing,  Tooth/dental problems,  Sore throat,                No sneezing, itching, ear ache, nasal congestion, post nasal drip,   CV:  Notes  chest pain,  Orthopnea, PND, swelling in lower extremities, anasarca, dizziness, palpitations  GI  No heartburn, indigestion, abdominal pain, nausea, vomiting, diarrhea, change in bowel habits, loss of appetite  Resp: less   shortness of breath with exertion or at rest.  No excess mucus, no   productive cough,   No coughing up of blood.  No  change in color of mucus. No chest wall deformity  Skin: no rash or lesions.  GU: no dysuria, change in color of urine, no urgency or frequency.  No flank pain.  MS:  No joint pain or swelling.  No decreased range of motion.  No back pain.  Psych:  No change in mood or affect. No depression or anxiety.  No memory loss.     Objective:   Physical Exam  Filed Vitals:   11/29/11 0957 11/29/11 1006  BP: 98/52 100/48  Pulse: 68   Temp: 98.4 F (36.9 C)   TempSrc: Oral   Height: 5\' 11"  (1.803 m)   Weight: 85.73 kg (189 lb)   SpO2: 98%     Gen: Pleasant, well-nourished, in no distress,  normal affect  ENT: No lesions,  mouth clear,  oropharynx clear, no postnasal drip  Neck: No JVD, no TMG, no carotid bruits  Lungs: No use of accessory muscles, no dullness to percussion,  Improved BS  Cardiovascular: RRR, heart sounds normal, no murmur or gallops, no peripheral edema  Abdomen: soft and NT, no HSM,  BS normal  Musculoskeletal: No deformities, no cyanosis or clubbing  Neuro: alert, non focal  Skin: Warm, no lesions or rashes        Assessment &  Plan:   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 albuterol (PROAIR HFA) 108 (90 BASE) MCG/ACT inhaler Inhale 2 puffs into the lungs 4 (four) times daily as needed. For shortness of breath      . aspirin 81 MG tablet Take 81 mg by mouth daily.      . beclomethasone (QVAR) 80 MCG/ACT inhaler Three puff twice daily for 7 days then two puff twice daily  1 Inhaler  6  . bisacodyl (DULCOLAX) 5 MG EC tablet Take 15 mg by mouth daily as needed.      . metoprolol tartrate (LOPRESSOR) 25 MG tablet Take 1 tablet (25 mg total) by mouth 2 (two) times daily.  60 tablet  6  . omeprazole (PRILOSEC) 20 MG capsule Take 20 mg by mouth daily.      . Tamsulosin HCl (FLOMAX) 0.4 MG CAPS Take 0.4 mg by mouth daily as needed.      . traMADol (ULTRAM) 50 MG tablet Take 50 mg by mouth every 6 (six) hours as needed.      Marland Kitchen DISCONTD: beclomethasone (QVAR) 80 MCG/ACT inhaler Inhale 2 puffs into the lungs 2 (two) times daily.      Marland Kitchen DISCONTD: LORazepam (ATIVAN) 0.5 MG tablet 1 - 2 tabs by mouth per day as needed for anxiety  60 tablet  0  . DISCONTD: Tamsulosin HCl (FLOMAX) 0.4 MG CAPS Take 1 capsule (0.4 mg total) by mouth daily.  90 capsule  3  . predniSONE (DELTASONE) 10 MG tablet Take 4 for two days three for two days two for two days one for two days  20 tablet  0  . DISCONTD: azithromycin (ZITHROMAX) 250 MG tablet 2 today then one daily until gone  6 tablet  0  . DISCONTD: Docusate Calcium (STOOL SOFTENER PO) Take 1 capsule by mouth daily.      Marland Kitchen DISCONTD: Linaclotide (LINZESS) 145 MCG CAPS Take 1 capsule (145 mcg total) by mouth daily.  30 capsule  0  . DISCONTD: LORazepam (ATIVAN) 0.5 MG tablet 1 - 2 tabs by mouth per day as needed for anxiety  60 tablet  2  . DISCONTD: predniSONE (DELTASONE) 10 MG tablet Take 4 tablets a day x 5 days, then stop.   20 tablet  0

## 2011-12-04 ENCOUNTER — Ambulatory Visit: Payer: Medicare Other | Admitting: Internal Medicine

## 2011-12-06 DIAGNOSIS — Z0279 Encounter for issue of other medical certificate: Secondary | ICD-10-CM

## 2011-12-14 ENCOUNTER — Ambulatory Visit: Payer: Medicare Other | Admitting: Internal Medicine

## 2011-12-26 ENCOUNTER — Ambulatory Visit (INDEPENDENT_AMBULATORY_CARE_PROVIDER_SITE_OTHER): Payer: Medicare Other | Admitting: Critical Care Medicine

## 2011-12-26 ENCOUNTER — Encounter: Payer: Self-pay | Admitting: Critical Care Medicine

## 2011-12-26 VITALS — BP 110/70 | HR 81 | Temp 97.9°F | Ht 71.0 in | Wt 185.2 lb

## 2011-12-26 DIAGNOSIS — Z23 Encounter for immunization: Secondary | ICD-10-CM

## 2011-12-26 DIAGNOSIS — J8410 Pulmonary fibrosis, unspecified: Secondary | ICD-10-CM

## 2011-12-26 NOTE — Progress Notes (Signed)
Subjective:    Patient ID: Matthew Bridges, male    DOB: Feb 20, 1931, 78 y.o.   MRN: [id number]  HPI  78 y.o.   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  Since that time is mediocre with dyspnea.  No real cough.  Worse at night.  No real wheeze.    Occ chest pain is noted.    11/29/2011 Pt notes more cough for 1 month.  Pt is worse at night.  Cough is productive of white mucus. Does not feel congested.  Pt notes some chest discomfort.  No real edema in feet.  Notes some heartburn.  Pt has pain in sternal area.  12/26/2011 At last ov we rec: Prednisone 10mg  Take 4 for two days three for two days two for two days one for two days Qvar three puff twice daily for 7days then two puff twice daily Hold flu vaccine for 2 weeks. Pt feels the same to sl better. Now not much cough.  Notes chest pain across ant sternal area.  Dyspnea at rest and exertion.  Notes some pndrip.   Mucus is less and cough is better., No fever, chills.     Past Medical History  Diagnosis Date  . Bronchiectasis   . Nephrolithiasis   . Pneumonia   . Hyperlipidemia   . Insomnia   . Alcohol abuse   . Asthmatic bronchitis   . Pleurisy   . GERD (gastroesophageal reflux disease)   . Diverticulosis     Colonoscopy 2007/Dr. Leone Payor  . Hx of colonic polyp   . H/O hiatal hernia   . Blood transfusion   . Anemia   . 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 Arthritis   . Anxiety   . Shortness of breath   . Coronary artery disease     sees Dr. Eden Emms, saw 1 month ago  . COPD (chronic obstructive pulmonary disease)     sees Dr/ Delford Field, saw last Dec 2012  . S/P CABG (coronary artery bypass graft)   . Lumbar disc disease 10/02/2011  . Emotional lability 10/02/2011  . Chronic low back pain 10/02/2011  . BPH (benign prostatic hyperplasia)  10/02/2011     Family History  Problem Relation Age of Onset  . Heart disease Mother   . Colon cancer Father   . Alcohol abuse Father   . Colon cancer Brother   . Lung cancer Brother   . Breast cancer Sister   . Heart disease Other     maternal side      History   Social History  . Marital Status: Married    Spouse Name: N/A    Number of Children: 3  . Years of Education: N/A   Occupational History  . Retired    Social History Main Topics  . Smoking status: Former Smoker -- 1.0 packs/day for 10 years    Types: Cigarettes    Quit date: 03/20/1956  . Smokeless tobacco: Former Neurosurgeon    Types: Snuff, Chew    Quit date: 03/20/1948  . Alcohol Use: No  . Drug Use: No  . Sexually Active: Not on file   Other Topics Concern  . Not on file   Social History Narrative  . No narrative on file     Allergies  Allergen Reactions  .  Codeine Other (See Comments)    Makes him feel dizzy, out of it  . Penicillins Hives     Outpatient Prescriptions Prior to Visit  Medication Sig Dispense Refill  . albuterol (PROAIR HFA) 108 (90 BASE) MCG/ACT inhaler Inhale 2 puffs into the lungs 4 (four) times daily as needed. For shortness of breath      . ALPRAZolam (XANAX) 0.25 MG tablet Take 1 tablet (0.25 mg total) by mouth 2 (two) times daily as needed for anxiety.  60 tablet  1  . aspirin 81 MG tablet Take 81 mg by mouth daily.      . bisacodyl (DULCOLAX) 5 MG EC tablet Take 15 mg by mouth daily as needed.      . metoprolol tartrate (LOPRESSOR) 25 MG tablet Take 1 tablet (25 mg total) by mouth 2 (two) times daily.  60 tablet  6  . omeprazole (PRILOSEC) 20 MG capsule Take 20 mg by mouth daily.      . Tamsulosin HCl (FLOMAX) 0.4 MG CAPS Take 0.4 mg by mouth daily as needed.      . traMADol (ULTRAM) 50 MG tablet Take 50 mg by mouth every 6 (six) hours as needed.      . beclomethasone (QVAR) 80 MCG/ACT inhaler Three puff twice daily for 7 days then two puff twice daily  1 Inhaler  6  .  predniSONE (DELTASONE) 10 MG tablet Take 4 for two days three for two days two for two days one for two days  20 tablet  0      Review of Systems  Constitutional:   No  weight loss, night sweats,  Fevers, chills, + fatigue, lassitude. HEENT:   No headaches,  Difficulty swallowing,  Tooth/dental problems,  Sore throat,                No sneezing, itching, ear ache, nasal congestion, post nasal drip,   CV:  Notes  chest pain,  Orthopnea, PND, swelling in lower extremities, anasarca, dizziness, palpitations  GI  No heartburn, indigestion, abdominal pain, nausea, vomiting, diarrhea, change in bowel habits, loss of appetite  Resp: less   shortness of breath with exertion or at rest.  No excess mucus, no   productive cough,   No coughing up of blood.  No  change in color of mucus. No chest wall deformity  Skin: no rash or lesions.  GU: no dysuria, change in color of urine, no urgency or frequency.  No flank pain.  MS:  No joint pain or swelling.  No decreased range of motion.  No back pain.  Psych:  No change in mood or affect. No depression or anxiety.  No memory loss.     Objective:   Physical Exam  Filed Vitals:   12/26/11 1116  BP: 110/70  Pulse: 81  Temp: 97.9 F (36.6 C)  TempSrc: Oral  Height: 5\' 11"  (1.803 m)  Weight: 185 lb 3.2 oz (84.006 kg)  SpO2: 97%    Gen: Pleasant, well-nourished, in no distress,  normal affect  ENT: No lesions,  mouth clear,  oropharynx clear, no postnasal drip  Neck: No JVD, no TMG, no carotid bruits  Lungs: No use of accessory muscles, no dullness to percussion,  Improved BS  Cardiovascular: RRR, heart sounds normal, no murmur or gallops, no peripheral edema  Abdomen: soft and NT, no HSM,  BS normal  Musculoskeletal: No deformities, no cyanosis or clubbing  Neuro: alert, non focal  Skin: Warm, no lesions or  rashes        Assessment & Plan:   Postinflammatory pulmonary fibrosis  hypersensitivity pneumonitis in the setting  of bronchiectasis status post recent flare now improved  Plan Maintain Qvar 80 mcg 2 puff twice daily No additional systemic steroids indicated Patient to continue to work on inhaler technique    Updated Medication List Outpatient Encounter Prescriptions as of 12/26/2011  Medication Sig Dispense Refill  . albuterol (PROAIR HFA) 108 (90 BASE) MCG/ACT inhaler Inhale 2 puffs into the lungs 4 (four) times daily as needed. For shortness of breath      . ALPRAZolam (XANAX) 0.25 MG tablet Take 1 tablet (0.25 mg total) by mouth 2 (two) times daily as needed for anxiety.  60 tablet  1  . aspirin 81 MG tablet Take 81 mg by mouth daily.      . beclomethasone (QVAR) 80 MCG/ACT inhaler Inhale 2 puffs into the lungs 2 (two) times daily.      . bisacodyl (DULCOLAX) 5 MG EC tablet Take 15 mg by mouth daily as needed.      . metoprolol tartrate (LOPRESSOR) 25 MG tablet Take 1 tablet (25 mg total) by mouth 2 (two) times daily.  60 tablet  6  . omeprazole (PRILOSEC) 20 MG capsule Take 20 mg by mouth daily.      . Tamsulosin HCl (FLOMAX) 0.4 MG CAPS Take 0.4 mg by mouth daily as needed.      . traMADol (ULTRAM) 50 MG tablet Take 50 mg by mouth every 6 (six) hours as needed.      Marland Kitchen DISCONTD: beclomethasone (QVAR) 80 MCG/ACT inhaler Three puff twice daily for 7 days then two puff twice daily  1 Inhaler  6  . DISCONTD: predniSONE (DELTASONE) 10 MG tablet Take 4 for two days three for two days two for two days one for two days  20 tablet  0

## 2011-12-26 NOTE — Assessment & Plan Note (Signed)
hypersensitivity pneumonitis in the setting of bronchiectasis status post recent flare now improved  Plan Maintain Qvar 80 mcg 2 puff twice daily No additional systemic steroids indicated Patient to continue to work on inhaler technique

## 2011-12-26 NOTE — Patient Instructions (Addendum)
Work on inhaler technique Stay on Qvar Flu vaccine today Return 4 months

## 2012-01-01 ENCOUNTER — Ambulatory Visit (INDEPENDENT_AMBULATORY_CARE_PROVIDER_SITE_OTHER): Payer: Medicare Other | Admitting: Internal Medicine

## 2012-01-01 ENCOUNTER — Encounter: Payer: Self-pay | Admitting: Internal Medicine

## 2012-01-01 VITALS — BP 104/64 | HR 79 | Temp 97.5°F | Ht 71.0 in | Wt 187.1 lb

## 2012-01-01 DIAGNOSIS — F1011 Alcohol abuse, in remission: Secondary | ICD-10-CM

## 2012-01-01 DIAGNOSIS — R7302 Impaired glucose tolerance (oral): Secondary | ICD-10-CM

## 2012-01-01 DIAGNOSIS — E538 Deficiency of other specified B group vitamins: Secondary | ICD-10-CM

## 2012-01-01 DIAGNOSIS — M8580 Other specified disorders of bone density and structure, unspecified site: Secondary | ICD-10-CM | POA: Insufficient documentation

## 2012-01-01 DIAGNOSIS — M899 Disorder of bone, unspecified: Secondary | ICD-10-CM

## 2012-01-01 DIAGNOSIS — R7309 Other abnormal glucose: Secondary | ICD-10-CM

## 2012-01-01 DIAGNOSIS — M949 Disorder of cartilage, unspecified: Secondary | ICD-10-CM

## 2012-01-01 DIAGNOSIS — Z0000 Encounter for general adult medical examination without abnormal findings: Secondary | ICD-10-CM

## 2012-01-01 HISTORY — DX: Other specified disorders of bone density and structure, unspecified site: M85.80

## 2012-01-01 HISTORY — DX: Deficiency of other specified B group vitamins: E53.8

## 2012-01-01 MED ORDER — CYANOCOBALAMIN 1000 MCG/ML IJ SOLN
1000.0000 ug | Freq: Once | INTRAMUSCULAR | Status: AC
Start: 2012-01-01 — End: 2012-01-01
  Administered 2012-01-01: 1000 ug via INTRAMUSCULAR

## 2012-01-01 MED ORDER — LORAZEPAM 0.5 MG PO TABS: 0.5000 mg | ORAL_TABLET | Freq: Two times a day (BID) | ORAL | Status: DC | PRN

## 2012-01-01 NOTE — Assessment & Plan Note (Signed)
To cont abstinence,  to f/u any worsening symptoms or concerns

## 2012-01-01 NOTE — Progress Notes (Signed)
Subjective:    Patient ID: Matthew Bridges, male    DOB: 08/03/1930, 79 y.o.   MRN: [id number]  HPI  Here to f/u; has seen GI/Dr Leone Payor, with once BM per wk since the heart surgury with dulcolax;  Other otc not working.  Pt denies chest pain, increased sob or doe, wheezing, orthopnea, PND, increased LE swelling, palpitations, dizziness or syncope.  Pt denies new neurological symptoms such as new headache, or facial or extremity weakness or numbness   Pt denies polydipsia, polyuria.  Does have leg weakness such that he has to hold on with hands to pull himself up.  Has hx of osteoporosis per wife, needs f/u exam.  Pt denies fever, wt loss, night sweats, loss of appetite, or other constitutional symptoms  No ETOH use recently Past Medical History  Diagnosis Date  . Bronchiectasis   . Nephrolithiasis   . Pneumonia   . Hyperlipidemia   . Insomnia   . Alcohol abuse   . Asthmatic bronchitis   . Pleurisy   . GERD (gastroesophageal reflux disease)   . Diverticulosis     Colonoscopy 2007/Dr. Leone Payor  . Hx of colonic polyp   . H/O hiatal hernia   . Blood transfusion   . Anemia   . 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 Arthritis   . Anxiety   . Shortness of breath   . Coronary artery disease     sees Dr. Eden Emms, saw 1 month ago  . COPD (chronic obstructive pulmonary disease)     sees Dr/ Delford Field, saw last Dec 2012  . S/P CABG (coronary artery bypass graft)   . Lumbar disc disease 10/02/2011  . Emotional lability 10/02/2011  . Chronic low back pain 10/02/2011  . BPH (benign prostatic hyperplasia) 10/02/2011  . B12 deficiency 01/01/2012   Past Surgical History  Procedure Date  . Tonsillectomy   . Cataract extraction     bilateral  . Kidney stone surgery     removal of kidney stone  . Cardiac catheterization     Friday 4, 2013  . Coronary artery bypass graft 03/31/2011    Procedure: CORONARY ARTERY BYPASS GRAFTING (CABG);  Surgeon: [name], MD;  Location: [facility]  OR;  Service: Open Heart Surgery;  Laterality: N/A;  Times three using the mammary and right leg greater saphenous vein.   . Colonoscopy Multiple    reports that he quit smoking about 55 years ago. His smoking use included Cigarettes. He has a 10 pack-year smoking history. He quit smokeless tobacco use about 63 years ago. His smokeless tobacco use included Snuff and Chew. He reports that he does not drink alcohol or use illicit drugs. family history includes Alcohol abuse in his father; Breast cancer in his sister; Colon cancer in his brother and father; Heart disease in his mother and other; and Lung cancer in his brother. Allergies  Allergen Reactions  . Alprazolam Other (See Comments)    Confusion, weakness  . Codeine Other (See Comments)    Makes him feel dizzy, out of it  . Penicillins Hives   Current Outpatient Prescriptions on File Prior to Visit  Medication Sig Dispense Refill  . albuterol (PROAIR HFA) 108 (90 BASE) MCG/ACT inhaler Inhale 2 puffs into the lungs 4 (four) times daily as needed. For shortness of breath      . aspirin 81 MG tablet Take 81 mg by mouth daily.      . beclomethasone (QVAR) 80 MCG/ACT  inhaler Inhale 2 puffs into the lungs 2 (two) times daily.      . bisacodyl (DULCOLAX) 5 MG EC tablet Take 15 mg by mouth daily as needed.      . metoprolol tartrate (LOPRESSOR) 25 MG tablet Take 1 tablet (25 mg total) by mouth 2 (two) times daily.  60 tablet  6  . omeprazole (PRILOSEC) 20 MG capsule Take 20 mg by mouth daily.      . Tamsulosin HCl (FLOMAX) 0.4 MG CAPS Take 0.4 mg by mouth daily as needed.      . traMADol (ULTRAM) 50 MG tablet Take 50 mg by mouth every 6 (six) hours as needed.       No current facility-administered medications on file prior to visit.   Review of Systems  Constitutional: Negative for diaphoresis and unexpected weight change.  HENT: Negative for tinnitus.   Eyes: Negative for photophobia and visual disturbance.  Respiratory: Negative for  choking and stridor.   Gastrointestinal: Negative for vomiting and blood in stool.  Genitourinary: Negative for hematuria and decreased urine volume.  Musculoskeletal: Negative for worseing gait problem.  Skin: Negative for color change and wound.  Neurological: Negative for tremors and numbness.  Psychiatric/Behavioral: Negative for decreased concentration. The patient is not hyperactive.       Objective:   Physical Exam BP 104/64  Pulse 79  Temp 97.5 F (36.4 C) (Oral)  Ht 5\' 11"  (1.803 m)  Wt 187 lb 2 oz (84.879 kg)  BMI 26.10 kg/m2  SpO2 96% Physical Exam  VS noted, not ill appearing Constitutional: Pt appears well-developed and well-nourished.  HENT: Head: Normocephalic.  Right Ear: External ear normal.  Left Ear: External ear normal.  Eyes: Conjunctivae and EOM are normal. Pupils are equal, round, and reactive to light.  Neck: Normal range of motion. Neck supple.  Cardiovascular: Normal rate and regular rhythm.   Pulmonary/Chest: Effort normal and breath sounds normal.  Abd:  Soft, NT, non-distended, + BS Neurological: Pt is alert. Not confused  Skin: Skin is warm. No erythema.  Psychiatric: Pt behavior is normal. Thought content normal.     Assessment & Plan:

## 2012-01-01 NOTE — Assessment & Plan Note (Signed)
Wife states pt with hx of osteopenia, though I suggested she might mean OA, but she is adamant, for dxa scan

## 2012-01-01 NOTE — Patient Instructions (Addendum)
You had the B12 shot today, and return monthly for your shots in the future (please make a nurse appointment) Continue all other medications as before Please keep your appointments with your specialists as you have planned - Dr Leone Payor and Dr Eden Emms Your lorazepam was refilled today Please see the Schedulers as you leave to have a Bone Density test scheduled Please return in 6 mo with Lab testing done 3-5 days before

## 2012-01-01 NOTE — Assessment & Plan Note (Signed)
stable overall by hx and exam, most recent data reviewed with pt, and pt to continue medical treatment as before Lab Results  Component Value Date   HGBA1C 5.8* 03/29/2011

## 2012-01-01 NOTE — Assessment & Plan Note (Signed)
For cont'd b12 monthly as he done for years,  to f/u any worsening symptoms or concerns

## 2012-01-03 ENCOUNTER — Ambulatory Visit (INDEPENDENT_AMBULATORY_CARE_PROVIDER_SITE_OTHER)
Admission: RE | Admit: 2012-01-03 | Discharge: 2012-01-03 | Disposition: A | Discharge status: Home / Self Care | Payer: Medicare Other | Source: Ambulatory Visit

## 2012-01-03 DIAGNOSIS — M8580 Other specified disorders of bone density and structure, unspecified site: Secondary | ICD-10-CM

## 2012-01-03 DIAGNOSIS — M899 Disorder of bone, unspecified: Secondary | ICD-10-CM

## 2012-01-25 ENCOUNTER — Telehealth: Payer: Self-pay

## 2012-01-25 NOTE — Telephone Encounter (Signed)
The oct 16 report has not yet been finalized per Dr Cato Mulligan, but I have reviewed and can say the preliminary results are consistent with severe Osteoporosis  Matthew Bridges - ok to send report to pt;  To note the Lowest T-score is -3.0 (c/w above)  I believe I had mentioned Prolia to this pt;  I would like to recommend this if ok with pt  [name] to ask pt

## 2012-01-25 NOTE — Telephone Encounter (Signed)
Patient would like bone density results. 

## 2012-01-26 NOTE — Telephone Encounter (Signed)
OK for Ruby to look into Copay for Prolia for this pt  [name] to notify pt, hopefully ruby to call soon with info

## 2012-01-26 NOTE — Telephone Encounter (Signed)
Patient informed. 

## 2012-01-26 NOTE — Telephone Encounter (Signed)
Called left message to call back 

## 2012-01-26 NOTE — Telephone Encounter (Signed)
Called the patient informed of results and "yes" he does want to proceed with prolia! Mailed copy of report to the patient.

## 2012-02-09 ENCOUNTER — Telehealth: Payer: Self-pay

## 2012-02-09 MED ORDER — ALENDRONATE SODIUM 70 MG PO TABS: 70.0000 mg | ORAL_TABLET | ORAL | Status: DC

## 2012-02-09 NOTE — Telephone Encounter (Signed)
Message copied by Pincus Sanes on Fri Feb 09, 2012  1:54 PM ------      Message from: Clover Mealy      Created: Fri Feb 09, 2012 11:36 AM      Regarding: Matthew Bridges,      Mr. Menges h/b approved for the Prolia inj.  His wife would like to know in he can take something less expensive first.  The 20% that his insurance doesn't pay is too much for them at the  present ($165 approx).  I advised her to call [employer] so that they can give her an exact amt of what would be due once Mr. Tsang plan pays.              Mrs. Rhines w/be in on Monday, so she would like to ask Dr. Jonny Ruiz about an alternative to the Prolia inj.            Ruby

## 2012-02-09 NOTE — Telephone Encounter (Signed)
Called informed the patients wife.  The patient agreed to try fosamax.  The patients wife informed that the patient has 1 BM every 5 days.  He takes 2 ducolax per day and is the only thing that does help.  Please advise on BM problem and send in fosamax to pharmacy

## 2012-02-09 NOTE — Telephone Encounter (Signed)
Faxed hardcopy to pharmacy. 

## 2012-02-09 NOTE — Telephone Encounter (Signed)
Ok for fosamax - [employer] to robin, as we dont have a pharmacy on file  Ok to cont the dulcolox per Dr Leone Payor from his June 2013 visit

## 2012-02-09 NOTE — Telephone Encounter (Signed)
There is no other medication "close" to the prolia  There are other tx for osteoporosis, such as fosamax that is much less expensive, is once per wk, but can have GI side effects, and is limited to 5 yrs of tx  If they would like to try the fosamax (I dont think he has been on this or similar medication in the past) that would be ok

## 2012-02-14 ENCOUNTER — Ambulatory Visit (INDEPENDENT_AMBULATORY_CARE_PROVIDER_SITE_OTHER): Payer: Medicare Other | Admitting: Internal Medicine

## 2012-02-14 ENCOUNTER — Encounter: Payer: Self-pay | Admitting: Internal Medicine

## 2012-02-14 VITALS — BP 112/60 | HR 72 | Temp 97.4°F | Ht 71.0 in | Wt 189.0 lb

## 2012-02-14 DIAGNOSIS — R269 Unspecified abnormalities of gait and mobility: Secondary | ICD-10-CM

## 2012-02-14 DIAGNOSIS — R7302 Impaired glucose tolerance (oral): Secondary | ICD-10-CM

## 2012-02-14 DIAGNOSIS — M25511 Pain in right shoulder: Secondary | ICD-10-CM

## 2012-02-14 DIAGNOSIS — M7710 Lateral epicondylitis, unspecified elbow: Secondary | ICD-10-CM

## 2012-02-14 DIAGNOSIS — M7711 Lateral epicondylitis, right elbow: Secondary | ICD-10-CM

## 2012-02-14 DIAGNOSIS — R7309 Other abnormal glucose: Secondary | ICD-10-CM

## 2012-02-14 DIAGNOSIS — G8929 Other chronic pain: Secondary | ICD-10-CM | POA: Insufficient documentation

## 2012-02-14 DIAGNOSIS — M25519 Pain in unspecified shoulder: Secondary | ICD-10-CM

## 2012-02-14 HISTORY — DX: Other chronic pain: G89.29

## 2012-02-14 HISTORY — DX: Unspecified abnormalities of gait and mobility: R26.9

## 2012-02-14 HISTORY — DX: Lateral epicondylitis, right elbow: M77.11

## 2012-02-14 MED ORDER — PREDNISONE 10 MG PO TABS: ORAL_TABLET | ORAL | Status: DC

## 2012-02-14 MED ORDER — TRAMADOL HCL 50 MG PO TABS: 50.0000 mg | ORAL_TABLET | Freq: Four times a day (QID) | ORAL | Status: DC | PRN

## 2012-02-14 MED ORDER — METHYLPREDNISOLONE ACETATE 80 MG/ML IJ SUSP
120.0000 mg | Freq: Once | INTRAMUSCULAR | Status: AC
  Administered 2012-02-14: 120 mg via INTRAMUSCULAR

## 2012-02-14 NOTE — Patient Instructions (Addendum)
You had the steroid shot today Take all new medications as prescribed - the prednisone, and the tramadol pain medication (not the tylenol with codeine as you have had trouble with this in the past) If not improved by Mon Dec 2, please call as you will need to be referred to orthopedic (at least for the shoulder) - such as Dr Ranell Patrick at [facility] orth[facility]ics, or to [facility] on [facility] Please avoid lifting the weights for now to avoid further tendonitis Continue all other medications as before Please have the pharmacy call with any other refills you may need.

## 2012-02-16 ENCOUNTER — Encounter: Payer: Self-pay | Admitting: Internal Medicine

## 2012-02-16 NOTE — Assessment & Plan Note (Signed)
I suspect rotater cuff tear, for pain control, and pt declines ortho, though I asked him to call in 3-5 days if not improved for referral

## 2012-02-16 NOTE — Progress Notes (Signed)
Subjective:    Patient ID: Matthew Bridges, male    DOB: 1930/08/26, 78 y.o.   MRN: [id number]  HPI  Here with RUE pain for 3 wks, does light wts frequently for exercise, seems to have 2 problems, first with moderate pain/swelling to left lateral epicondylar area without distal pain/weakness/numbness.   Also with right shoulder pain, mod to severe, worse to abduct and in fact unable to abduct fully;  No fall or other injury, no neck pain and not worse with neck movement.   Pt denies fever, wt loss, night sweats, loss of appetite, or other constitutional symptoms  Pt denies chest pain, increased sob or doe, wheezing, orthopnea, PND, increased LE swelling, palpitations, dizziness or syncope.  Pt denies new neurological symptoms such as new headache, or facial or extremity weakness or numbness Past Medical History  Diagnosis Date  . Bronchiectasis   . Nephrolithiasis   . Pneumonia   . Hyperlipidemia   . Insomnia   . Alcohol abuse   . Asthmatic bronchitis   . Pleurisy   . GERD (gastroesophageal reflux disease)   . Diverticulosis     Colonoscopy 2007/Dr. Leone Payor  . Hx of colonic polyp   . H/O hiatal hernia   . Blood transfusion   . Anemia   . 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 Arthritis   . Anxiety   . Shortness of breath   . Coronary artery disease     sees Dr. Eden Emms, saw 1 month ago  . COPD (chronic obstructive pulmonary disease)     sees Dr/ Delford Field, saw last Dec 2012  . S/P CABG (coronary artery bypass graft)   . Lumbar disc disease 10/02/2011  . Emotional lability 10/02/2011  . Chronic low back pain 10/02/2011  . BPH (benign prostatic hyperplasia) 10/02/2011  . B12 deficiency 01/01/2012   Past Surgical History  Procedure Date  . Tonsillectomy   . Cataract extraction     bilateral  . Kidney stone surgery     removal of kidney stone  . Cardiac catheterization     Friday 4, 2013  . Coronary artery bypass graft 03/31/2011    Procedure: CORONARY ARTERY  BYPASS GRAFTING (CABG);  Surgeon: [name], MD;  Location: [facility] OR;  Service: Open Heart Surgery;  Laterality: N/A;  Times three using the mammary and right leg greater saphenous vein.   . Colonoscopy Multiple    reports that he quit smoking about 55 years ago. His smoking use included Cigarettes. He has a 10 pack-year smoking history. He quit smokeless tobacco use about 63 years ago. His smokeless tobacco use included Snuff and Chew. He reports that he does not drink alcohol or use illicit drugs. family history includes Alcohol abuse in his father; Breast cancer in his sister; Colon cancer in his brother and father; Heart disease in his mother and other; and Lung cancer in his brother. Allergies  Allergen Reactions  . Alprazolam Other (See Comments)    Confusion, weakness  . Codeine Other (See Comments)    Makes him feel dizzy, out of it  . Penicillins Hives   Current Outpatient Prescriptions on File Prior to Visit  Medication Sig Dispense Refill  . albuterol (PROAIR HFA) 108 (90 BASE) MCG/ACT inhaler Inhale 2 puffs into the lungs 4 (four) times daily as needed. For shortness of breath      . alendronate (FOSAMAX) 70 MG tablet Take 1 tablet (70 mg total) by mouth every 7 (seven)  days. Take with a full glass of water on an empty stomach.  12 tablet  3  . aspirin 81 MG tablet Take 81 mg by mouth daily.      . beclomethasone (QVAR) 80 MCG/ACT inhaler Inhale 2 puffs into the lungs 2 (two) times daily.      . bisacodyl (DULCOLAX) 5 MG EC tablet Take 15 mg by mouth daily as needed.      Marland Kitchen LORazepam (ATIVAN) 0.5 MG tablet Take 1 tablet (0.5 mg total) by mouth 2 (two) times daily as needed for anxiety.  60 tablet  2  . metoprolol tartrate (LOPRESSOR) 25 MG tablet Take 1 tablet (25 mg total) by mouth 2 (two) times daily.  60 tablet  6  . omeprazole (PRILOSEC) 20 MG capsule Take 20 mg by mouth daily.      . Tamsulosin HCl (FLOMAX) 0.4 MG CAPS Take 0.4 mg by mouth daily as needed.       Review of  Systems  Constitutional: Negative for diaphoresis and unexpected weight change.  HENT: Negative for tinnitus.   Eyes: Negative for photophobia and visual disturbance.  Respiratory: Negative for choking and stridor.   Gastrointestinal: Negative for vomiting and blood in stool.  Genitourinary: Negative for hematuria and decreased urine volume.  Musculoskeletal: Negative for gait problem.  Skin: Negative for color change and wound.  Neurological: Negative for tremors and numbness.  Psychiatric/Behavioral: Negative for decreased concentration. The patient is not hyperactive.      Objective:   Physical Exam BP 112/60  Pulse 72  Temp 97.4 F (36.3 C) (Oral)  Ht 5\' 11"  (1.803 m)  Wt 189 lb (85.73 kg)  BMI 26.36 kg/m2  SpO2 98% Physical Exam  VS noted Constitutional: Pt appears well-developed and well-nourished.  HENT: Head: Normocephalic.  Right Ear: External ear normal.  Left Ear: External ear normal.  Eyes: Conjunctivae and EOM are normal. Pupils are equal, round, and reactive to light.  Neck: Normal range of motion. Neck supple.  Cardiovascular: Normal rate and regular rhythm.   Pulmonary/Chest: Effort normal and breath sounds normal.  Neurological: Pt is alert. Not confused , motor/sens/dtr intact to UE's Right lateral epicondylar mod tender/swelling Right shoulder without tender but marked pain with active ROM to 90 degrees only Neck FROM, NT Skin: Skin is warm. No erythema. No rash Psychiatric: Pt behavior is normal. Thought content normal.    Assessment & Plan:

## 2012-02-16 NOTE — Assessment & Plan Note (Addendum)
stable overall by hx and exam,  and pt to continue medical treatment as before, pt to call for onset polys or cbg > 200

## 2012-02-16 NOTE — Assessment & Plan Note (Addendum)
mod, for depomedrol IM, and predpack asd as pt declines referral to ortho for now,  to f/u any worsening symptoms or concerns

## 2012-02-17 ENCOUNTER — Other Ambulatory Visit: Payer: Self-pay | Admitting: Internal Medicine

## 2012-03-20 ENCOUNTER — Emergency Department (HOSPITAL_COMMUNITY): Payer: Medicare Other

## 2012-03-20 ENCOUNTER — Emergency Department (HOSPITAL_COMMUNITY)
Admission: EM | Admit: 2012-03-20 | Discharge: 2012-03-20 | Disposition: A | Discharge status: Home / Self Care | Payer: Medicare Other | Attending: Emergency Medicine | Admitting: Emergency Medicine

## 2012-03-20 ENCOUNTER — Encounter (HOSPITAL_COMMUNITY): Payer: Self-pay | Admitting: *Deleted

## 2012-03-20 DIAGNOSIS — Z8709 Personal history of other diseases of the respiratory system: Secondary | ICD-10-CM | POA: Insufficient documentation

## 2012-03-20 DIAGNOSIS — K219 Gastro-esophageal reflux disease without esophagitis: Secondary | ICD-10-CM | POA: Insufficient documentation

## 2012-03-20 DIAGNOSIS — F411 Generalized anxiety disorder: Secondary | ICD-10-CM | POA: Insufficient documentation

## 2012-03-20 DIAGNOSIS — R110 Nausea: Secondary | ICD-10-CM | POA: Insufficient documentation

## 2012-03-20 DIAGNOSIS — R42 Dizziness and giddiness: Secondary | ICD-10-CM | POA: Insufficient documentation

## 2012-03-20 DIAGNOSIS — E785 Hyperlipidemia, unspecified: Secondary | ICD-10-CM | POA: Insufficient documentation

## 2012-03-20 DIAGNOSIS — Z79899 Other long term (current) drug therapy: Secondary | ICD-10-CM | POA: Insufficient documentation

## 2012-03-20 DIAGNOSIS — Z87448 Personal history of other diseases of urinary system: Secondary | ICD-10-CM | POA: Insufficient documentation

## 2012-03-20 DIAGNOSIS — Z8719 Personal history of other diseases of the digestive system: Secondary | ICD-10-CM | POA: Insufficient documentation

## 2012-03-20 DIAGNOSIS — Z860100 Personal history of colon polyps, unspecified: Secondary | ICD-10-CM | POA: Insufficient documentation

## 2012-03-20 DIAGNOSIS — J4489 Other specified chronic obstructive pulmonary disease: Secondary | ICD-10-CM | POA: Insufficient documentation

## 2012-03-20 DIAGNOSIS — Z7982 Long term (current) use of aspirin: Secondary | ICD-10-CM | POA: Insufficient documentation

## 2012-03-20 DIAGNOSIS — Z593 Problems related to living in residential institution: Secondary | ICD-10-CM | POA: Insufficient documentation

## 2012-03-20 DIAGNOSIS — Z862 Personal history of diseases of the blood and blood-forming organs and certain disorders involving the immune mechanism: Secondary | ICD-10-CM | POA: Insufficient documentation

## 2012-03-20 DIAGNOSIS — Z951 Presence of aortocoronary bypass graft: Secondary | ICD-10-CM | POA: Insufficient documentation

## 2012-03-20 DIAGNOSIS — I2510 Atherosclerotic heart disease of native coronary artery without angina pectoris: Secondary | ICD-10-CM | POA: Insufficient documentation

## 2012-03-20 DIAGNOSIS — Z8669 Personal history of other diseases of the nervous system and sense organs: Secondary | ICD-10-CM | POA: Insufficient documentation

## 2012-03-20 DIAGNOSIS — F1010 Alcohol abuse, uncomplicated: Secondary | ICD-10-CM | POA: Insufficient documentation

## 2012-03-20 DIAGNOSIS — Z8739 Personal history of other diseases of the musculoskeletal system and connective tissue: Secondary | ICD-10-CM | POA: Insufficient documentation

## 2012-03-20 DIAGNOSIS — J449 Chronic obstructive pulmonary disease, unspecified: Secondary | ICD-10-CM | POA: Insufficient documentation

## 2012-03-20 DIAGNOSIS — Z8639 Personal history of other endocrine, nutritional and metabolic disease: Secondary | ICD-10-CM | POA: Insufficient documentation

## 2012-03-20 DIAGNOSIS — R109 Unspecified abdominal pain: Secondary | ICD-10-CM | POA: Insufficient documentation

## 2012-03-20 DIAGNOSIS — Z789 Other specified health status: Secondary | ICD-10-CM | POA: Insufficient documentation

## 2012-03-20 DIAGNOSIS — Z87891 Personal history of nicotine dependence: Secondary | ICD-10-CM | POA: Insufficient documentation

## 2012-03-20 DIAGNOSIS — Z8701 Personal history of pneumonia (recurrent): Secondary | ICD-10-CM | POA: Insufficient documentation

## 2012-03-20 DIAGNOSIS — M6281 Muscle weakness (generalized): Secondary | ICD-10-CM | POA: Insufficient documentation

## 2012-03-20 LAB — CBC WITH DIFFERENTIAL/PLATELET
Basophils Absolute: 0 10*3/uL (ref 0.0–0.1)
Basophils Relative: 0 % (ref 0–1)
Eosinophils Absolute: 0.2 10*3/uL (ref 0.0–0.7)
Hemoglobin: 13.1 g/dL (ref 13.0–17.0)
MCH: 31.4 pg (ref 26.0–34.0)
MCHC: 33.7 g/dL (ref 30.0–36.0)
Monocytes Relative: 4 % (ref 3–12)
Neutro Abs: 12.1 10*3/uL — ABNORMAL HIGH (ref 1.7–7.7)
Neutrophils Relative %: 90 % — ABNORMAL HIGH (ref 43–77)
Platelets: 137 10*3/uL — ABNORMAL LOW (ref 150–400)
RDW: 14.3 % (ref 11.5–15.5)

## 2012-03-20 LAB — URINALYSIS, ROUTINE W REFLEX MICROSCOPIC
Ketones, ur: NEGATIVE mg/dL
Leukocytes, UA: NEGATIVE
Nitrite: NEGATIVE
Urobilinogen, UA: 0.2 mg/dL (ref 0.0–1.0)
pH: 6.5 (ref 5.0–8.0)

## 2012-03-20 LAB — COMPREHENSIVE METABOLIC PANEL
ALT: 11 U/L (ref 0–53)
AST: 16 U/L (ref 0–37)
Albumin: 3.7 g/dL (ref 3.5–5.2)
Alkaline Phosphatase: 63 U/L (ref 39–117)
Chloride: 102 mEq/L (ref 96–112)
Potassium: 3.4 mEq/L — ABNORMAL LOW (ref 3.5–5.1)
Sodium: 138 mEq/L (ref 135–145)
Total Bilirubin: 0.8 mg/dL (ref 0.3–1.2)
Total Protein: 6.5 g/dL (ref 6.0–8.3)

## 2012-03-20 LAB — INFLUENZA PANEL BY PCR (TYPE A & B): Influenza A By PCR: NEGATIVE

## 2012-03-20 MED ORDER — SODIUM CHLORIDE 0.9 % IV SOLN: INTRAVENOUS | Status: DC

## 2012-03-20 MED ORDER — IOHEXOL 300 MG/ML  SOLN
20.0000 mL | INTRAMUSCULAR | Status: AC
  Administered 2012-03-20 (×2): 20 mL via ORAL

## 2012-03-20 MED ORDER — ACETAMINOPHEN 325 MG PO TABS
650.0000 mg | ORAL_TABLET | Freq: Once | ORAL | Status: AC
  Administered 2012-03-20: 650 mg via ORAL
  Filled 2012-03-20: qty 2

## 2012-03-20 MED ORDER — SODIUM CHLORIDE 0.9 % IV BOLUS (SEPSIS)
500.0000 mL | Freq: Once | INTRAVENOUS | Status: AC
  Administered 2012-03-20: 500 mL via INTRAVENOUS

## 2012-03-20 MED ORDER — IOHEXOL 300 MG/ML  SOLN
80.0000 mL | Freq: Once | INTRAMUSCULAR | Status: AC | PRN
  Administered 2012-03-20: 80 mL via INTRAVENOUS

## 2012-03-20 NOTE — ED Notes (Signed)
Pt provided with sandwich bag meal.  Per order of Dr. Effie Shy.  No distress noted.

## 2012-03-20 NOTE — ED Notes (Signed)
Pt is from [facility] facility

## 2012-03-20 NOTE — ED Provider Notes (Signed)
History     CSN: [id number]  Arrival date & time 03/20/12  0919   First MD Initiated Contact with Patient 03/20/12 1231      Chief Complaint  Patient presents with  . Extremity Weakness    BILATERAL LOWER  . Dizziness    (Consider location/radiation/quality/duration/timing/severity/associated sxs/prior treatment) HPI Comments: Matthew Bridges is a 79 y.o. Male who was awakened this morning by abdominal pain. He was diaphoretic and tachycardic at the time. His wife that he was having a heart attack. He had transient lower extremity weakness. He did not receive any medication for the problem. He has had mild nasal congestion, and occasional cough. He's not been vomiting. He has intermittent stooling, requiring a Dulcolax suppository every 5 days, to have a bowel movement. No sick contacts. He's been using his medications as usual. He did not eat today, and does not feel hungry, at this time.  Patient is a 79 y.o. male presenting with extremity weakness. The history is provided by the patient.  Extremity Weakness    Past Medical History  Diagnosis Date  . Bronchiectasis   . Nephrolithiasis   . Pneumonia   . Hyperlipidemia   . Insomnia   . Alcohol abuse   . Asthmatic bronchitis   . Pleurisy   . GERD (gastroesophageal reflux disease)   . Diverticulosis     Colonoscopy 2007/Dr. Leone Payor  . Hx of colonic polyp   . H/O hiatal hernia   . Blood transfusion   . Anemia   . 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 Arthritis   . Anxiety   . Shortness of breath   . Coronary artery disease     sees Dr. Eden Emms, saw 1 month ago  . COPD (chronic obstructive pulmonary disease)     sees Dr/ Delford Field, saw last Dec 2012  . S/P CABG (coronary artery bypass graft)   . Lumbar disc disease 10/02/2011  . Emotional lability 10/02/2011  . Chronic low back pain 10/02/2011  . BPH (benign prostatic hyperplasia) 10/02/2011  . B12 deficiency 01/01/2012    Past Surgical History    Procedure Date  . Tonsillectomy   . Cataract extraction     bilateral  . Kidney stone surgery     removal of kidney stone  . Cardiac catheterization     Friday 4, 2013  . Coronary artery bypass graft 03/31/2011    Procedure: CORONARY ARTERY BYPASS GRAFTING (CABG);  Surgeon: [name], MD;  Location: [facility];  Service: Open Heart Surgery;  Laterality: N/A;  Times three using the mammary and right leg greater saphenous vein.   . Colonoscopy Multiple    Family History  Problem Relation Age of Onset  . Heart disease Mother   . Colon cancer Father   . Alcohol abuse Father   . Colon cancer Brother   . Lung cancer Brother   . Breast cancer Sister   . Heart disease Other     maternal side     History  Substance Use Topics  . Smoking status: Former Smoker -- 1.0 packs/day for 10 years    Types: Cigarettes    Quit date: 03/20/1956  . Smokeless tobacco: Former Neurosurgeon    Types: Snuff, Chew    Quit date: 03/20/1948  . Alcohol Use: No      Review of Systems  Musculoskeletal: Positive for extremity weakness.  All other systems reviewed and are negative.    Allergies  Alprazolam; Codeine;  and Penicillins  Home Medications   Current Outpatient Rx  Name  Route  Sig  Dispense  Refill  . ALBUTEROL SULFATE HFA 108 (90 BASE) MCG/ACT IN AERS   Inhalation   Inhale 2 puffs into the lungs 4 (four) times daily as needed. For shortness of breath         . ALENDRONATE SODIUM 70 MG PO TABS   Oral   Take 1 tablet (70 mg total) by mouth every 7 (seven) days. Take with a full glass of water on an empty stomach.   12 tablet   3   . ASPIRIN 81 MG PO TABS   Oral   Take 81 mg by mouth daily.         . BECLOMETHASONE DIPROPIONATE 80 MCG/ACT IN AERS   Inhalation   Inhale 2 puffs into the lungs 2 (two) times daily.         Marland Kitchen BISACODYL 5 MG PO TBEC   Oral   Take 15 mg by mouth daily as needed.         Marland Kitchen LORAZEPAM 0.5 MG PO TABS   Oral   Take 1 tablet (0.5 mg total) by  mouth 2 (two) times daily as needed for anxiety.   60 tablet   2   . METOPROLOL TARTRATE 25 MG PO TABS   Oral   Take 1 tablet (25 mg total) by mouth 2 (two) times daily.   60 tablet   6   . OMEPRAZOLE 20 MG PO CPDR   Oral   Take 20 mg by mouth daily.         Marland Kitchen TAMSULOSIN HCL 0.4 MG PO CAPS   Oral   Take 0.4 mg by mouth daily as needed. For bladder         . TRAMADOL HCL 50 MG PO TABS   Oral   Take 1 tablet (50 mg total) by mouth every 6 (six) hours as needed.   60 tablet   1     BP 119/39  Pulse 78  Temp 98.1 F (36.7 C) (Oral)  Resp 17  SpO2 96%  Physical Exam  Nursing note and vitals reviewed. Constitutional: He is oriented to person, place, and time. He appears well-developed.       Elderly, frail  HENT:  Head: Normocephalic and atraumatic.  Right Ear: External ear normal.  Left Ear: External ear normal.  Eyes: Conjunctivae normal and EOM are normal. Pupils are equal, round, and reactive to light.  Neck: Normal range of motion and phonation normal. Neck supple.  Cardiovascular: Normal rate, regular rhythm, normal heart sounds and intact distal pulses.   Pulmonary/Chest: Effort normal and breath sounds normal. He exhibits no bony tenderness.  Abdominal: Soft. Normal appearance. He exhibits no distension and no mass. There is no tenderness. There is no guarding.  Musculoskeletal: Normal range of motion. He exhibits no edema.  Neurological: He is alert and oriented to person, place, and time. He has normal strength. No cranial nerve deficit or sensory deficit. He exhibits normal muscle tone. Coordination normal.       Mild confusion  Skin: Skin is warm, dry and intact.  Psychiatric: He has a normal mood and affect. His behavior is normal.    ED Course  Procedures (including critical care time)     Date: 01/05/2012  Rate: 94  Rhythm: normal sinus rhythm  QRS Axis: normal  PR and QT Intervals: normal  ST/T Wave abnormalities: nonspecific T wave  changes  PR and QRS Conduction Disutrbances:none  Narrative Interpretation:   Old EKG Reviewed: unchanged   Labs Reviewed  CBC WITH DIFFERENTIAL - Abnormal; Notable for the following:    WBC 13.5 (*)     RBC 4.17 (*)     HCT 38.9 (*)     Platelets 137 (*)     Neutrophils Relative 90 (*)     Neutro Abs 12.1 (*)     Lymphocytes Relative 4 (*)     Lymphs Abs 0.6 (*)     All other components within normal limits  COMPREHENSIVE METABOLIC PANEL - Abnormal; Notable for the following:    Potassium 3.4 (*)     GFR calc non Af Amer 86 (*)     All other components within normal limits  URINALYSIS, ROUTINE W REFLEX MICROSCOPIC  INFLUENZA PANEL BY PCR   Dg Chest 2 View  03/20/2012  *RADIOLOGY REPORT*  Clinical Data: Right chest and shoulder pain.  COPD.  Coronary artery disease.  CHEST - 2 VIEW  Comparison: 11/29/2011  Findings: Heart size is at the upper limits of normal, and is stable.  Prior CABG again noted.  Chronic interstitial prominence is seen mainly in the lung bases and is stable.  No evidence of acute or superimposed infiltrate.  No evidence of congestive heart failure or pleural effusion.  No mass or lymphadenopathy identified.  IMPRESSION: Stable borderline cardiomegaly and chronic interstitial prominence. No active disease.   Original Report Authenticated By: Myles Rosenthal, M.D.    Dg Shoulder Right  03/20/2012  *RADIOLOGY REPORT*  Clinical Data: Injury lifting weights.  RIGHT SHOULDER - 2+ VIEW  Comparison: None.  Findings: No evidence for fracture.  No findings to suggest shoulder separation or dislocation. No worrisome lytic or sclerotic osseous lesion.  Degenerative changes are noted at the [facility] joint.  IMPRESSION: No acute bony findings.   Original Report Authenticated By: Kennith Center, M.D.    Ct Abdomen Pelvis W Contrast  03/20/2012  *RADIOLOGY REPORT*  Clinical Data: Abdominal pain.  Nausea.  Dizziness.  CT ABDOMEN AND PELVIS WITH CONTRAST  Technique:  Multidetector CT imaging of the  abdomen and pelvis was performed following the standard protocol during bolus administration of intravenous contrast.  Contrast: 80mL OMNIPAQUE IOHEXOL 300 MG/ML  SOLN  Comparison: 04/26/2005  Findings: The abdominal parenchymal organs are normal in appearance.  No soft tissue masses or lymphadenopathy identified. The gallbladder is unremarkable.  No evidence of hydronephrosis. Tiny hiatal hernia again demonstrated.  Extensive diverticulosis and is again demonstrated, however there is no evidence of diverticulitis.  No other inflammatory process or abnormal fluid collections identified.  Normal appendix is visualized. No evidence of hernia.  IMPRESSION:  1.  No acute findings. 2.  Tiny hiatal hernia. 3.  Diverticulosis.  No radiographic evidence of diverticulitis.   Original Report Authenticated By: Myles Rosenthal, M.D.    Nursing notes, applicable records and vitals reviewed.  Radiologic Images/Reports reviewed.   1. Influenza-like illness       MDM   influenza -like illness. No evidence for pneumonia, urinary tract infection, large joint infection, or suspected occult material, infection. Patient is improved. Treatment in ED, and is stable for discharge    Plan: Home Medications- usual plus Tylenol; Home Treatments- rest, fluids; Recommended follow up- PCP, one week         Flint Melter, MD 03/20/12 1640

## 2012-03-20 NOTE — ED Notes (Signed)
PER ems- PT HAD BILATERAL LOWER EXTREMITY WEAKNESS and numbness at approx 530am. Reports that the numbness has resolved. Pt also has nausea and dizziness. Pt received 4mg  of zofran en route. Initial BP 240/94 after zofran 142/64. Pulse 94. CBG 108. No neuro deficits with EMS.

## 2012-03-20 NOTE — ED Notes (Signed)
Patient transported to CT 

## 2012-03-20 NOTE — ED Notes (Signed)
Returned from CT.  No changes at the present.  No distress noted.

## 2012-03-20 NOTE — ED Notes (Signed)
MD at bedside. 

## 2012-03-20 NOTE — ED Notes (Signed)
Ct notified of pt completion of PO contrast.

## 2012-03-21 ENCOUNTER — Encounter: Payer: Self-pay | Admitting: Internal Medicine

## 2012-03-26 ENCOUNTER — Ambulatory Visit: Payer: Medicare Other | Admitting: Internal Medicine

## 2012-03-29 ENCOUNTER — Ambulatory Visit (INDEPENDENT_AMBULATORY_CARE_PROVIDER_SITE_OTHER): Payer: Medicare Other | Admitting: Internal Medicine

## 2012-03-29 ENCOUNTER — Encounter: Payer: Self-pay | Admitting: Internal Medicine

## 2012-03-29 VITALS — BP 122/76 | HR 68 | Temp 97.0°F | Ht 71.0 in | Wt 189.4 lb

## 2012-03-29 DIAGNOSIS — I6529 Occlusion and stenosis of unspecified carotid artery: Secondary | ICD-10-CM

## 2012-03-29 DIAGNOSIS — R251 Tremor, unspecified: Secondary | ICD-10-CM

## 2012-03-29 DIAGNOSIS — F419 Anxiety disorder, unspecified: Secondary | ICD-10-CM

## 2012-03-29 DIAGNOSIS — R259 Unspecified abnormal involuntary movements: Secondary | ICD-10-CM

## 2012-03-29 DIAGNOSIS — I951 Orthostatic hypotension: Secondary | ICD-10-CM

## 2012-03-29 DIAGNOSIS — H9191 Unspecified hearing loss, right ear: Secondary | ICD-10-CM

## 2012-03-29 DIAGNOSIS — H9190 Unspecified hearing loss, unspecified ear: Secondary | ICD-10-CM

## 2012-03-29 DIAGNOSIS — F411 Generalized anxiety disorder: Secondary | ICD-10-CM

## 2012-03-29 DIAGNOSIS — E538 Deficiency of other specified B group vitamins: Secondary | ICD-10-CM

## 2012-03-29 HISTORY — DX: Orthostatic hypotension: I95.1

## 2012-03-29 MED ORDER — LORAZEPAM 0.5 MG PO TABS: 0.5000 mg | ORAL_TABLET | Freq: Two times a day (BID) | ORAL | Status: DC | PRN

## 2012-03-29 MED ORDER — CYANOCOBALAMIN 1000 MCG/ML IJ SOLN
1000.0000 ug | Freq: Once | INTRAMUSCULAR | Status: AC
  Administered 2012-03-29: 1000 ug via INTRAMUSCULAR

## 2012-03-29 MED ORDER — METOPROLOL TARTRATE 25 MG PO TABS: ORAL_TABLET | ORAL | Status: DC

## 2012-03-29 NOTE — Patient Instructions (Addendum)
You will be contacted regarding the referral for: neurology Your right ear was irrigated of wax today Continue all other medications as before - including the 1/2 of the metoprolol 25 mg (twice per day) You will be contacted regarding the referral for: carotid test (not sure why you have not been contacted, but we'll re-order since you are due) You had the b12 shot today Your lorazepam is refilled today Please have the pharmacy call with any other refills you may need. Please keep your appointments with your specialists as you have planned - remember you are due for Dr Eden Emms in June 2014 No further blood work needed today Please return in 3 mo with Lab testing done 3-5 days before

## 2012-03-29 NOTE — Progress Notes (Signed)
Subjective:    Patient ID: Matthew Bridges, male    DOB: [birth year], 79 y.o.   MRN: [id number]  HPI  Here after seen in ER, neg eval for febrile illness, PCR neg for flu but viral illness likely it seems after exam, CXR, CT abd neg for acute; had slight elev but chronic stable elev wbc;  Had 3 days shakes and chills, and n/v, confusion for 3 days after ER but better after that per wife, Does have sense of ongoing fatigue, but denies signficant hypersomnolence, just has no energy.  Has upper sharp chest discomfort only with lying down at night.  Walks with cane, no falls.  Pt denies other chest pain, increased sob or doe, wheezing, orthopnea, PND, increased LE swelling, palpitations, dizziness or syncope, though has some off balance/swaying with ambulation, has seen Dr Catalina Lunger prevoiusly, but Dr Modesto Charon now left the practice.  Due for B12 shot.  Has some decreased hearing right ear - ? Wax.  Denies worsening depressive symptoms, suicidal ideation, or panic; has ongoing anxiety, not increased recently, needs refill med.  Needs carotid doppler f/u (has been 6 mo) but pt has not been contacted.  Med review shows pt actually only taking metoprolol 25 mg - 1/2 bid (not 1 bid), wife states they were advised this but I cant find documentation on EMR today, but I think she is most likely correct, ? Advised due to recurring orthostatic hypotension. Past Medical History  Diagnosis Date  . Bronchiectasis   . Nephrolithiasis   . Pneumonia   . Hyperlipidemia   . Insomnia   . Alcohol abuse   . Asthmatic bronchitis   . Pleurisy   . GERD (gastroesophageal reflux disease)   . Diverticulosis     Colonoscopy 2007/Dr. Leone Payor  . Hx of colonic polyp   . H/O hiatal hernia   . Blood transfusion   . Anemia   . 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 Arthritis   . Anxiety   . Shortness of breath   . Coronary artery disease     sees Dr. Eden Emms, saw 1 month ago  . COPD (chronic obstructive  pulmonary disease)     sees Dr/ Delford Field, saw last Dec 2012  . S/P CABG (coronary artery bypass graft)   . Lumbar disc disease 10/02/2011  . Emotional lability 10/02/2011  . Chronic low back pain 10/02/2011  . BPH (benign prostatic hyperplasia) 10/02/2011  . B12 deficiency 01/01/2012  . Orthostatic hypotension 03/29/2012   Past Surgical History  Procedure Date  . Tonsillectomy   . Cataract extraction     bilateral  . Kidney stone surgery     removal of kidney stone  . Cardiac catheterization     Friday 4, 2013  . Coronary artery bypass graft 03/31/2011    Procedure: CORONARY ARTERY BYPASS GRAFTING (CABG);  Surgeon: [name], MD;  Location: [facility] OR;  Service: Open Heart Surgery;  Laterality: N/A;  Times three using the mammary and right leg greater saphenous vein.   . Colonoscopy Multiple    reports that he quit smoking about 56 years ago. His smoking use included Cigarettes. He has a 10 pack-year smoking history. He quit smokeless tobacco use about 64 years ago. His smokeless tobacco use included Snuff and Chew. He reports that he does not drink alcohol or use illicit drugs. family history includes Alcohol abuse in his father; Breast cancer in his sister; Colon cancer in his brother and father; Heart  disease in his mother and other; and Lung cancer in his brother. Allergies  Allergen Reactions  . Alprazolam Other (See Comments)    Confusion, weakness  . Codeine Other (See Comments)    Makes him feel dizzy, out of it  . Penicillins Hives   Current Outpatient Prescriptions on File Prior to Visit  Medication Sig Dispense Refill  . albuterol (PROAIR HFA) 108 (90 BASE) MCG/ACT inhaler Inhale 2 puffs into the lungs 4 (four) times daily as needed. For shortness of breath      . alendronate (FOSAMAX) 70 MG tablet Take 1 tablet (70 mg total) by mouth every 7 (seven) days. Take with a full glass of water on an empty stomach.  12 tablet  3  . aspirin 81 MG tablet Take 81 mg by mouth daily.        . beclomethasone (QVAR) 80 MCG/ACT inhaler Inhale 2 puffs into the lungs 2 (two) times daily.      . bisacodyl (DULCOLAX) 5 MG EC tablet Take 15 mg by mouth daily as needed.      . metoprolol tartrate (LOPRESSOR) 25 MG tablet 1/2 tab twice per day  60 tablet  6  . omeprazole (PRILOSEC) 20 MG capsule Take 20 mg by mouth daily.      . Tamsulosin HCl (FLOMAX) 0.4 MG CAPS Take 0.4 mg by mouth daily as needed. For bladder      . traMADol (ULTRAM) 50 MG tablet Take 1 tablet (50 mg total) by mouth every 6 (six) hours as needed.  60 tablet  1   Review of Systems  Constitutional: Negative for unexpected weight change, or unusual diaphoresis  HENT: Negative for tinnitus.   Eyes: Negative for photophobia and visual disturbance.  Respiratory: Negative for choking and stridor.   Gastrointestinal: Negative for vomiting and blood in stool.  Genitourinary: Negative for hematuria and decreased urine volume.  Musculoskeletal: Negative for acute joint swelling Skin: Negative for color change and wound.  Neurological: Negative for tremors and numbness other than noted  Psychiatric/Behavioral: Negative for decreased concentration or  hyperactivity.       Objective:   Physical Exam BP 122/76  Pulse 68  Temp 97 F (36.1 C) (Oral)  Ht 5\' 11"  (1.803 m)  Wt 189 lb 6 oz (85.9 kg)  BMI 26.41 kg/m2  SpO2 96% Physical Exam  VS noted, not ill appearing but frail, walks with cane  HENT: Head: NCAT. Right ear canal cleared of wax, hearing improved Bilat tm's with mild erythema.  Max sinus nontender.  Pharynx with mild erythema, no exudate Right Ear: External ear normal.  Left Ear: External ear normal.  Eyes: Conjunctivae and EOM are normal. Pupils are equal, round, and reactive to light.  Neck: Normal range of motion. Neck supple.  Cardiovascular: Normal rate and regular rhythm.   Pulmonary/Chest: Effort normal and breath sounds normal.  Abd:  Soft, NT, non-distended, + BS Neurological: Pt is alert. Not  confused , tremor noted Skin: Skin is warm. No erythema.  Psychiatric: Pt behavior is normal. Thought content normal. Mild nervous    Assessment & Plan:

## 2012-03-30 ENCOUNTER — Encounter: Payer: Self-pay | Admitting: Internal Medicine

## 2012-03-30 DIAGNOSIS — H9191 Unspecified hearing loss, right ear: Secondary | ICD-10-CM | POA: Insufficient documentation

## 2012-03-30 HISTORY — DX: Unspecified hearing loss, right ear: H91.91

## 2012-03-30 NOTE — Assessment & Plan Note (Signed)
stable overall by history and exam, and pt to continue medical treatment as before,  to f/u any worsening symptoms or concerns 

## 2012-03-30 NOTE — Assessment & Plan Note (Signed)
Improved with irrigation,  to f/u any worsening symptoms or concerns  

## 2012-03-30 NOTE — Assessment & Plan Note (Addendum)
?   Some improved with decreased metoprolol - to cont as is for now, med list corrected  Note:  Total time for pt hx, exam, review of record with pt in the room, determination of diagnoses and plan for further eval and tx is > 40 min, with over 50% spent in coordination and counseling of patient

## 2012-03-30 NOTE — Assessment & Plan Note (Signed)
Will need to help assist pt with f/u neuro appt

## 2012-04-03 ENCOUNTER — Encounter (INDEPENDENT_AMBULATORY_CARE_PROVIDER_SITE_OTHER): Payer: Medicare Other

## 2012-04-03 DIAGNOSIS — I6529 Occlusion and stenosis of unspecified carotid artery: Secondary | ICD-10-CM

## 2012-04-05 ENCOUNTER — Encounter: Payer: Self-pay | Admitting: Internal Medicine

## 2012-04-22 ENCOUNTER — Ambulatory Visit: Payer: Medicare Other | Admitting: Neurology

## 2012-04-24 ENCOUNTER — Telehealth: Payer: Self-pay | Admitting: Internal Medicine

## 2012-04-24 DIAGNOSIS — M79601 Pain in right arm: Secondary | ICD-10-CM

## 2012-04-24 DIAGNOSIS — M25511 Pain in right shoulder: Secondary | ICD-10-CM

## 2012-04-24 HISTORY — DX: Pain in right arm: M79.601

## 2012-04-24 NOTE — Telephone Encounter (Signed)
Referral done

## 2012-04-24 NOTE — Telephone Encounter (Signed)
Pt wants to have a referral to ortho for the right arm and shoulder pain.

## 2012-05-23 ENCOUNTER — Telehealth: Payer: Self-pay | Admitting: Internal Medicine

## 2012-05-23 ENCOUNTER — Telehealth: Payer: Self-pay | Admitting: *Deleted

## 2012-05-23 NOTE — Telephone Encounter (Signed)
Patient's wife advised that the next available is for 06/18/12.  They want to wait and decide in the am about cancelling the appt.

## 2012-05-23 NOTE — Telephone Encounter (Signed)
Called patient to advise them that we are unsure of weather and conditions tomorrow.  Advised patient to call our weather line at [phone] and check News 2 for delays or closings for tomorrow.  Patient verbalized understanding.

## 2012-05-24 ENCOUNTER — Ambulatory Visit: Payer: Medicare Other | Admitting: Internal Medicine

## 2012-05-28 ENCOUNTER — Telehealth: Payer: Self-pay | Admitting: Critical Care Medicine

## 2012-05-28 ENCOUNTER — Ambulatory Visit: Payer: Medicare Other | Admitting: Critical Care Medicine

## 2012-05-28 NOTE — Telephone Encounter (Signed)
Spoke to pt wife and offered an appointment with PW on 06/06/12 in [place] but she declined. The next available for [place] is 07/17/12, pt has been scheduled at 10:15am on that day.

## 2012-06-07 ENCOUNTER — Telehealth: Payer: Self-pay

## 2012-06-07 DIAGNOSIS — M25511 Pain in right shoulder: Secondary | ICD-10-CM

## 2012-06-07 NOTE — Telephone Encounter (Signed)
Called left message to call back 

## 2012-06-07 NOTE — Telephone Encounter (Signed)
The patient has seen an ortho for his shoulder and did have an injection.  HE was not happy with that MD and would like another referral.  He does not want surgery, but only interested in an injection.  Please advise.

## 2012-06-07 NOTE — Telephone Encounter (Signed)
If he could let us know which MD he saw, I can refer to another MD in a different group for a second opinion

## 2012-06-10 NOTE — Telephone Encounter (Signed)
Referral done per emr 

## 2012-06-10 NOTE — Telephone Encounter (Signed)
Called the patient and he previously saw Dr. Wynetta Emery. He would like to see Dr. Penni Bombard at [facility].

## 2012-06-11 ENCOUNTER — Telehealth: Payer: Self-pay

## 2012-06-11 NOTE — Telephone Encounter (Signed)
Patient informed. 

## 2012-06-11 NOTE — Telephone Encounter (Signed)
Referral has already been done mar 24

## 2012-06-11 NOTE — Telephone Encounter (Signed)
The patient called to inform his right shoulder/arm pain has worsened. States the pain makes him nauseated at times.  I informed he the referral has been faxed along with office notes to [place] [facility]. For appointment as he requested with Dr. Penni Bombard.  Please advise

## 2012-06-18 ENCOUNTER — Ambulatory Visit (INDEPENDENT_AMBULATORY_CARE_PROVIDER_SITE_OTHER): Payer: Medicare Other | Admitting: Internal Medicine

## 2012-06-18 ENCOUNTER — Encounter: Payer: Self-pay | Admitting: Internal Medicine

## 2012-06-18 VITALS — BP 92/56 | HR 80 | Ht 71.0 in | Wt 193.0 lb

## 2012-06-18 DIAGNOSIS — K5909 Other constipation: Secondary | ICD-10-CM

## 2012-06-18 DIAGNOSIS — R109 Unspecified abdominal pain: Secondary | ICD-10-CM

## 2012-06-18 DIAGNOSIS — K5900 Constipation, unspecified: Secondary | ICD-10-CM

## 2012-06-18 NOTE — Progress Notes (Signed)
  Subjective:    Patient ID: Matthew Bridges, male    DOB: Aug 05, 1930, 79 y.o.   MRN: [id number]  HPI The patient is here because of persistent constipation. He will move his bowels after taking 2 bisacodyl tablets and sometimes requires a glycerin suppository. He says nothing has really helped. Last seen in 2013 - tried 145 mcg Linzess - not effective. Prunes, prune juice, MrraLax no help. He did have a spontaneous bowel movement today but first time in a long time. In general no urge to defecate with progressive bloating and lower abdominal cramps, though they are mild.  Medications, allergies, past medical history, past surgical history, family history and social history are reviewed and updated in the EMR.  Review of Systems Weak, fatigued. Has been this way since 2012 CABG.    Objective:   Physical Exam General:  NAD Eyes:   anicteric Abdomen:  soft and nontender, BS+ Rectal:  Anoderm with small amount of fecal soiling, absent anal wink, normal resting tone, voluntary squeeze, and simulated defecation/descent. Ext:   no edema  Lab Results  Component Value Date   TSH 0.73 06/13/2011   Lab Results  Component Value Date   WBC 13.5* 03/20/2012   HGB 13.1 03/20/2012   HCT 38.9* 03/20/2012   MCV 93.3 03/20/2012   PLT 137* 03/20/2012      Assessment & Plan:  Chronic constipation  Lower abdominal pain 1. start with sitzmarks evaluation 2. Further plans pending that 3. Increase activity

## 2012-06-18 NOTE — Patient Instructions (Addendum)
Today you have been given a Sitzmarker to swallow.  Do not take any laxatives between now and when you come back on 06/24/12 for an x-ray in our basement.    Thank you for choosing me and [facility].  Iva Boop, M.D., [facility]

## 2012-06-24 ENCOUNTER — Ambulatory Visit (INDEPENDENT_AMBULATORY_CARE_PROVIDER_SITE_OTHER)
Admission: RE | Admit: 2012-06-24 | Discharge: 2012-06-24 | Disposition: A | Discharge status: Home / Self Care | Payer: Medicare Other | Source: Ambulatory Visit | Attending: Internal Medicine | Admitting: Internal Medicine

## 2012-06-24 DIAGNOSIS — K5900 Constipation, unspecified: Secondary | ICD-10-CM

## 2012-06-24 DIAGNOSIS — K5909 Other constipation: Secondary | ICD-10-CM

## 2012-06-24 NOTE — Progress Notes (Signed)
Quick Note:  This study confirms that the colon not contracting well throughout  Have him take MiraLax purge tonight 4 dulcolax and then 4-6 glasses of MiraLax) - then use MiralAx 1 dose a day and take 2 dulcolax every other night +/- glycerine suppository  If that does not work after a month then call back ______

## 2012-07-04 ENCOUNTER — Ambulatory Visit: Payer: Medicare Other | Admitting: Internal Medicine

## 2012-07-09 ENCOUNTER — Encounter: Payer: Self-pay | Admitting: Internal Medicine

## 2012-07-09 ENCOUNTER — Ambulatory Visit (INDEPENDENT_AMBULATORY_CARE_PROVIDER_SITE_OTHER): Payer: Medicare Other | Admitting: Internal Medicine

## 2012-07-09 ENCOUNTER — Other Ambulatory Visit (INDEPENDENT_AMBULATORY_CARE_PROVIDER_SITE_OTHER): Payer: Medicare Other

## 2012-07-09 VITALS — BP 108/64 | HR 68 | Temp 98.1°F | Ht 71.0 in | Wt 193.0 lb

## 2012-07-09 DIAGNOSIS — M545 Low back pain: Secondary | ICD-10-CM

## 2012-07-09 DIAGNOSIS — R600 Localized edema: Secondary | ICD-10-CM | POA: Insufficient documentation

## 2012-07-09 DIAGNOSIS — K5900 Constipation, unspecified: Secondary | ICD-10-CM

## 2012-07-09 DIAGNOSIS — R609 Edema, unspecified: Secondary | ICD-10-CM

## 2012-07-09 DIAGNOSIS — G8929 Other chronic pain: Secondary | ICD-10-CM

## 2012-07-09 DIAGNOSIS — K5909 Other constipation: Secondary | ICD-10-CM

## 2012-07-09 HISTORY — DX: Other constipation: K59.09

## 2012-07-09 HISTORY — DX: Edema, unspecified: R60.9

## 2012-07-09 HISTORY — DX: Localized edema: R60.0

## 2012-07-09 LAB — BASIC METABOLIC PANEL
BUN: 14 mg/dL (ref 6–23)
Chloride: 103 mEq/L (ref 96–112)
GFR: 93.51 mL/min (ref >60.00)
Glucose, Bld: 81 mg/dL (ref 70–99)
Potassium: 4.2 mEq/L (ref 3.5–5.1)
Sodium: 138 mEq/L (ref 135–145)

## 2012-07-09 LAB — HEPATIC FUNCTION PANEL
ALT: 15 U/L (ref 0–53)
AST: 15 U/L (ref 0–37)
Total Protein: 6.8 g/dL (ref 6.0–8.3)

## 2012-07-09 LAB — CBC WITH DIFFERENTIAL/PLATELET
Basophils Relative: 0.7 % (ref 0.0–3.0)
Eosinophils Absolute: 1.3 10*3/uL — ABNORMAL HIGH (ref 0.0–0.7)
Eosinophils Relative: 12.4 % — ABNORMAL HIGH (ref 0.0–5.0)
Lymphocytes Relative: 28.5 % (ref 12.0–46.0)
MCHC: 33.6 g/dL (ref 30.0–36.0)
Monocytes Absolute: 0.7 10*3/uL (ref 0.1–1.0)
Neutrophils Relative %: 52.1 % (ref 43.0–77.0)
Platelets: 167 10*3/uL (ref 150.0–400.0)
RBC: 4.31 Mil/uL (ref 4.22–5.81)
WBC: 10.6 10*3/uL — ABNORMAL HIGH (ref 4.5–10.5)

## 2012-07-09 MED ORDER — HYDROCODONE-ACETAMINOPHEN 5-325 MG PO TABS: 1.0000 | ORAL_TABLET | Freq: Three times a day (TID) | ORAL | Status: DC | PRN

## 2012-07-09 NOTE — Assessment & Plan Note (Signed)
Ok for hydrocodone trial, for pain clinic referrral as well, watch for worsening constipation

## 2012-07-09 NOTE — Progress Notes (Signed)
Subjective:    Patient ID: Matthew Bridges, male    DOB: March 08, 1931, 79 y.o.   MRN: [id number]  HPI    Here to f/u, with 1 mo onset pedal edema with slight itching but no pain, ulceration; has been drinking more fluids and admits to table salt use.  Pt denies chest pain, increased sob or doe, wheezing, orthopnea, PND, increased LE swelling, palpitations, dizziness or syncope except for the above.  Pt denies new neurological symptoms such as new headache, or facial or extremity weakness or numbness  Denies worsening depressive symptoms, suicidal ideation, or panic; has ongoing emotional lability.  Pt continues to have recurring LBP without change in severity, bowel or bladder change, fever, wt loss,  worsening LE pain/numbness/weakness, gait change or falls. Could not tolerate tramadol, asks for better pain control, has not tried narcotic before, has ongoing chronic constipation.  Has seen two orthopedic, unhappy with results Past Medical History  Diagnosis Date  . Bronchiectasis   . Nephrolithiasis   . Pneumonia   . Hyperlipidemia   . Insomnia   . Alcohol abuse   . Asthmatic bronchitis   . Pleurisy   . GERD (gastroesophageal reflux disease)   . Diverticulosis     Colonoscopy 2007/Dr. Leone Payor  . Hx of colonic polyp     tubular adenoma (05/16/2009), hyperplastic polyp  . H/O hiatal hernia   . Blood transfusion   . Anemia   . 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 Arthritis   . Anxiety   . Shortness of breath   . Coronary artery disease     sees Dr. Eden Emms, saw 1 month ago  . COPD (chronic obstructive pulmonary disease)     sees Dr/ Delford Field, saw last Dec 2012  . S/P CABG (coronary artery bypass graft)   . Lumbar disc disease 10/02/2011  . Emotional lability 10/02/2011  . Chronic low back pain 10/02/2011  . BPH (benign prostatic hyperplasia) 10/02/2011  . B12 deficiency 01/01/2012  . Orthostatic hypotension 03/29/2012   Past Surgical History  Procedure Laterality  Date  . Tonsillectomy    . Cataract extraction      bilateral  . Kidney stone surgery      removal of kidney stone  . Cardiac catheterization      Friday 4, 2013  . Coronary artery bypass graft  03/31/2011    Procedure: CORONARY ARTERY BYPASS GRAFTING (CABG);  Surgeon: [name], MD;  Location: [facility] OR;  Service: Open Heart Surgery;  Laterality: N/A;  Times three using the mammary and right leg greater saphenous vein.   . Colonoscopy      multiple    reports that he quit smoking about 56 years ago. His smoking use included Cigarettes. He has a 10 pack-year smoking history. He quit smokeless tobacco use about 64 years ago. His smokeless tobacco use included Snuff and Chew. He reports that he does not drink alcohol or use illicit drugs. family history includes Alcohol abuse in his father; Breast cancer in his sister; Colon cancer in his brother and father; Heart disease in his mother and other; and Lung cancer in his brother. Allergies  Allergen Reactions  . Alprazolam Other (See Comments)    Confusion, weakness  . Codeine Other (See Comments)    Makes him feel dizzy, out of it  . Penicillins Hives  . Tramadol     Zombie state   Current Outpatient Prescriptions on File Prior to Visit  Medication Sig  Dispense Refill  . albuterol (PROAIR HFA) 108 (90 BASE) MCG/ACT inhaler Inhale 2 puffs into the lungs 4 (four) times daily as needed. For shortness of breath      . aspirin 81 MG tablet Take 81 mg by mouth daily.      . beclomethasone (QVAR) 80 MCG/ACT inhaler Inhale 2 puffs into the lungs 2 (two) times daily.      . bisacodyl (DULCOLAX) 5 MG EC tablet Take 15 mg by mouth daily as needed.      Marland Kitchen LORazepam (ATIVAN) 0.5 MG tablet Take 1 tablet (0.5 mg total) by mouth 2 (two) times daily as needed for anxiety.  60 tablet  5  . metoprolol tartrate (LOPRESSOR) 25 MG tablet 1/2 tab twice per day  60 tablet  6  . omeprazole (PRILOSEC) 20 MG capsule Take 20 mg by mouth daily.      . Tamsulosin  HCl (FLOMAX) 0.4 MG CAPS Take 0.4 mg by mouth daily as needed. For bladder       No current facility-administered medications on file prior to visit.   Review of Systems  Constitutional: Negative for unexpected weight change, or unusual diaphoresis  HENT: Negative for tinnitus.   Eyes: Negative for photophobia and visual disturbance.  Respiratory: Negative for choking and stridor.   Gastrointestinal: Negative for vomiting and blood in stool.  Genitourinary: Negative for hematuria and decreased urine volume.  Musculoskeletal: Negative for acute joint swelling Skin: Negative for color change and wound.  Neurological: Negative for tremors and numbness other than noted  Psychiatric/Behavioral: Negative for decreased concentration or  hyperactivity.       Objective:   Physical Exam BP 108/64  Pulse 68  Temp(Src) 98.1 F (36.7 C) (Oral)  Ht 5\' 11"  (1.803 m)  Wt 193 lb (87.544 kg)  BMI 26.93 kg/m2  SpO2 96% VS noted,  Constitutional: Pt appears well-developed and well-nourished.  HENT: Head: NCAT.  Right Ear: External ear normal.  Left Ear: External ear normal.  Eyes: Conjunctivae and EOM are normal. Pupils are equal, round, and reactive to light.  Neck: Normal range of motion. Neck supple.  Cardiovascular: Normal rate and regular rhythm.   Pulmonary/Chest: Effort normal and breath sounds normal.  Abd:  Soft, NT, non-distended, + BS Neurological: Pt is alert. Not confused , motor intact Skin: Skin is warm. No erythema. 1+ pedal/ankle edema Psychiatric: Pt behavior is normal. 1-2+ irritable    Assessment & Plan:

## 2012-07-09 NOTE — Assessment & Plan Note (Signed)
For decreased fluids, avoid table salt, compression stocking, leg elevation

## 2012-07-09 NOTE — Patient Instructions (Signed)
Ok to stop the tramadol as you have done Please take all new medication as prescribed - the pain medication You will be contacted regarding the referral for: pain clinic Please wear the compression stocking to the legs during the day to help with swelling, as well as low salt diet, leg elevation Please go to the LAB in the Basement (turn left off the elevator) for the tests to be done today You will be contacted by phone if any changes need to be made immediately.  Please remember to sign up for My Chart if you have not done so, as this will be important to you in the future with finding out test results, communicating by private email, and scheduling acute appointments online when needed.

## 2012-07-09 NOTE — Assessment & Plan Note (Signed)
?   Consider linzess for worsening

## 2012-07-10 ENCOUNTER — Encounter: Payer: Self-pay | Admitting: Internal Medicine

## 2012-07-10 LAB — URINALYSIS, ROUTINE W REFLEX MICROSCOPIC
Hgb urine dipstick: NEGATIVE
Nitrite: NEGATIVE
Specific Gravity, Urine: 1.015 (ref 1.000–1.030)
Urine Glucose: NEGATIVE
Urobilinogen, UA: 1 (ref 0.0–1.0)

## 2012-07-17 ENCOUNTER — Ambulatory Visit: Payer: Medicare Other | Admitting: Critical Care Medicine

## 2012-08-19 ENCOUNTER — Ambulatory Visit (INDEPENDENT_AMBULATORY_CARE_PROVIDER_SITE_OTHER)
Admission: RE | Admit: 2012-08-19 | Discharge: 2012-08-19 | Disposition: A | Discharge status: Home / Self Care | Payer: Medicare Other | Source: Ambulatory Visit | Attending: Critical Care Medicine | Admitting: Critical Care Medicine

## 2012-08-19 ENCOUNTER — Encounter: Payer: Self-pay | Admitting: Critical Care Medicine

## 2012-08-19 ENCOUNTER — Ambulatory Visit (INDEPENDENT_AMBULATORY_CARE_PROVIDER_SITE_OTHER): Payer: Medicare Other | Admitting: Critical Care Medicine

## 2012-08-19 VITALS — BP 110/70 | HR 80 | Temp 98.4°F | Ht 69.0 in | Wt 195.2 lb

## 2012-08-19 DIAGNOSIS — J8410 Pulmonary fibrosis, unspecified: Secondary | ICD-10-CM

## 2012-08-19 NOTE — Progress Notes (Signed)
Quick Note:  Notify the patient that the Xray is stable and no pneumonia No change in medications are recommended. Continue current meds as prescribed at last office visit ______ 

## 2012-08-19 NOTE — Patient Instructions (Addendum)
Resume Qvar 80  Two puff twice daily No other medication changes Chest xray today, will call results Return 2 months

## 2012-08-19 NOTE — Assessment & Plan Note (Signed)
History of hypersensitivity pneumonitis with associated bronchiectasis with gradual decline in lung function do discontinuance of inhaled corticosteroid Note chest x-ray on 08/19/12 stable without evidence of active pneumonitis Plan Resume inhaled corticosteroid Qvar 80 mcg 2 ventilations twice daily  samples were given

## 2012-08-19 NOTE — Progress Notes (Signed)
Subjective:    Patient ID: Matthew Bridges, male    DOB: [birth year], 79 y.o.   MRN: [id number]  HPI  79 y.o.   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 Follow-up    pt reports breathing has worsened since last OV, can not take in deep breaths, inc SOB w lighter activity-- has been out of QVar x 1 month (did not call in for refill)    Pt notes more shallow breathing, and epigastric pain. The patient notes some wheezing, the patient is not able take deep breaths. There is minimal mucus.  The patient stopped taking inhaled steroid because of cost issues.   Past Medical History  Diagnosis Date  . Bronchiectasis   . Nephrolithiasis   . Pneumonia   . Hyperlipidemia   . Insomnia   . Alcohol abuse   . Asthmatic bronchitis   . Pleurisy   . GERD (gastroesophageal reflux disease)   . Diverticulosis     Colonoscopy 2007/Dr. Leone Payor  . Hx of colonic polyp     tubular adenoma (05/16/2009), hyperplastic polyp  . H/O hiatal hernia   . Blood transfusion   . Anemia   . 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 Arthritis   . Anxiety   . Shortness of breath   . Coronary artery disease     sees Dr. Eden Emms, saw 1 month ago  . COPD (chronic obstructive pulmonary disease)     sees Dr/ Delford Field, saw last Dec 2012  . S/P CABG (coronary artery bypass graft)   . Lumbar disc disease 10/02/2011  . Emotional lability 10/02/2011  . Chronic low back pain 10/02/2011  . BPH (benign prostatic hyperplasia) 10/02/2011  . B12 deficiency 01/01/2012  . Orthostatic hypotension 03/29/2012     Family History  Problem Relation Age of Onset  . Heart disease Mother   . Colon cancer Father   . Alcohol abuse Father   . Colon cancer Brother   . Lung cancer Brother   . Breast cancer Sister   . Heart disease Other      maternal side      History   Social History  . Marital Status: Married    Spouse Name: N/A    Number of Children: 3  . Years of Education: N/A   Occupational History  . Retired    Social History Main Topics  . Smoking status: Former Smoker -- 1.00 packs/day for 10 years    Types: Cigarettes    Quit date: 03/20/1956  . Smokeless tobacco: Former Neurosurgeon    Types: Snuff, Chew    Quit date: 03/20/1948  . Alcohol Use: No  . Drug Use: No  . Sexually Active: Not on file   Other Topics Concern  . Not on file   Social History Narrative  . No narrative on file     Allergies  Allergen Reactions  . Alprazolam Other (See Comments)    Confusion, weakness  . Codeine Other (See Comments)    Makes him feel dizzy, out of it  . Penicillins Hives  . Tramadol     Zombie state     Outpatient Prescriptions Prior to Visit  Medication Sig Dispense Refill  . albuterol (PROAIR HFA) 108 (90 BASE) MCG/ACT inhaler Inhale 2 puffs into the lungs 4 (four)  times daily as needed. For shortness of breath      . aspirin 81 MG tablet Take 81 mg by mouth daily.      . bisacodyl (DULCOLAX) 5 MG EC tablet Take 15 mg by mouth daily as needed.      Marland Kitchen LORazepam (ATIVAN) 0.5 MG tablet Take 1 tablet (0.5 mg total) by mouth 2 (two) times daily as needed for anxiety.  60 tablet  5  . metoprolol tartrate (LOPRESSOR) 25 MG tablet 1/2 tab twice per day  60 tablet  6  . omeprazole (PRILOSEC) 20 MG capsule Take 20 mg by mouth daily.      . Tamsulosin HCl (FLOMAX) 0.4 MG CAPS Take 0.4 mg by mouth daily as needed. For bladder      . beclomethasone (QVAR) 80 MCG/ACT inhaler Inhale 2 puffs into the lungs 2 (two) times daily.      Marland Kitchen HYDROcodone-acetaminophen (NORCO/VICODIN) 5-325 MG per tablet Take 1 tablet by mouth every 8 (eight) hours as needed for pain.  90 tablet  3   No facility-administered medications prior to visit.      Review of Systems  Constitutional:   No  weight loss, night sweats,  Fevers,  chills, + fatigue, lassitude. HEENT:   No headaches,  Difficulty swallowing,  Tooth/dental problems,  Sore throat,                No sneezing, itching, ear ache, nasal congestion, post nasal drip,   CV:  Notes  chest pain,  Orthopnea, PND, swelling in lower extremities, anasarca, dizziness, palpitations  GI  No heartburn, indigestion, abdominal pain, nausea, vomiting, diarrhea, change in bowel habits, loss of appetite  Resp: less   shortness of breath with exertion or at rest.  No excess mucus, no   productive cough,   No coughing up of blood.  No  change in color of mucus. No chest wall deformity  Skin: no rash or lesions.  GU: no dysuria, change in color of urine, no urgency or frequency.  No flank pain.  MS:  No joint pain or swelling.  No decreased range of motion.  No back pain.  Psych:  No change in mood or affect. No depression or anxiety.  No memory loss.     Objective:   Physical Exam  Filed Vitals:   08/19/12 1510  BP: 110/70  Pulse: 80  Temp: 98.4 F (36.9 C)  TempSrc: Oral  Height: 5\' 9"  (1.753 m)  Weight: 195 lb 3.2 oz (88.542 kg)  SpO2: 97%    Gen: Pleasant, well-nourished, in no distress,  normal affect  ENT: No lesions,  mouth clear,  oropharynx clear, no postnasal drip  Neck: No JVD, no TMG, no carotid bruits  Lungs: No use of accessory muscles, no dullness to percussion, expired wheezes.  Cardiovascular: RRR, heart sounds normal, no murmur or gallops, no peripheral edema  Abdomen: soft and NT, no HSM,  BS normal  Musculoskeletal: No deformities, no cyanosis or clubbing  Neuro: alert, non focal  Skin: Warm, no lesions or rashes      Dg Chest 2 View  08/19/2012   *RADIOLOGY REPORT*  Clinical Data: Post inflammatory pulmonary fibrosis. Hypersensitive pneumonitis.  CHEST - 2 VIEW  Comparison: Two-view chest 03/20/2012.  Findings: Heart is mildly enlarged.  Peripheral fibrotic changes at the lung bases are stable.  No focal airspace disease is  evident. The patient is status post median sternotomy for CABG.  Mild exaggerated kyphosis is evident.  IMPRESSION:  1.  Stable changes of pulmonary fibrosis. 2.  Borderline cardiomegaly without failure. 3.  No acute cardiopulmonary disease.   Original Report Authenticated By: Marin Roberts, M.D.     Assessment & Plan:   Hypersensitivity pneumonitis with associated bronchiectasis History of hypersensitivity pneumonitis with associated bronchiectasis with gradual decline in lung function do discontinuance of inhaled corticosteroid Note chest x-ray on 08/19/12 stable without evidence of active pneumonitis Plan Resume inhaled corticosteroid Qvar 80 mcg 2 ventilations twice daily  samples were given    Updated Medication List Outpatient Encounter Prescriptions as of 08/19/2012  Medication Sig Dispense Refill  . albuterol (PROAIR HFA) 108 (90 BASE) MCG/ACT inhaler Inhale 2 puffs into the lungs 4 (four) times daily as needed. For shortness of breath      . aspirin 81 MG tablet Take 81 mg by mouth daily.      . bisacodyl (DULCOLAX) 5 MG EC tablet Take 15 mg by mouth daily as needed.      Marland Kitchen LORazepam (ATIVAN) 0.5 MG tablet Take 1 tablet (0.5 mg total) by mouth 2 (two) times daily as needed for anxiety.  60 tablet  5  . metoprolol tartrate (LOPRESSOR) 25 MG tablet 1/2 tab twice per day  60 tablet  6  . omeprazole (PRILOSEC) 20 MG capsule Take 20 mg by mouth daily.      . Tamsulosin HCl (FLOMAX) 0.4 MG CAPS Take 0.4 mg by mouth daily as needed. For bladder      . beclomethasone (QVAR) 80 MCG/ACT inhaler Inhale 2 puffs into the lungs 2 (two) times daily.      . [DISCONTINUED] HYDROcodone-acetaminophen (NORCO/VICODIN) 5-325 MG per tablet Take 1 tablet by mouth every 8 (eight) hours as needed for pain.  90 tablet  3   No facility-administered encounter medications on file as of 08/19/2012.

## 2012-08-20 NOTE — Progress Notes (Signed)
Quick Note:  Called, spoke with pt. Informed him of cxr results and recs per Dr. Wright. He verbalized understanding and voiced no further questions or concerns at this time. ______ 

## 2012-09-03 ENCOUNTER — Telehealth: Payer: Self-pay | Admitting: Cardiovascular Disease

## 2012-09-03 NOTE — Telephone Encounter (Signed)
LMTCB ./CY 

## 2012-09-03 NOTE — Telephone Encounter (Signed)
New problem   Per pts wife pharmacy has tried to get b/p med refilled 3 different times but states there's something wrong

## 2012-09-05 ENCOUNTER — Other Ambulatory Visit: Payer: Self-pay | Admitting: Internal Medicine

## 2012-09-05 MED ORDER — METOPROLOL TARTRATE 25 MG PO TABS: ORAL_TABLET | ORAL | Status: DC

## 2012-09-05 NOTE — Telephone Encounter (Signed)
Faxed hardcopy to [employer]

## 2012-09-05 NOTE — Telephone Encounter (Signed)
PT'S WIFE AWARE   MED REFILLED OVER COMPUTER./CY

## 2012-09-05 NOTE — Telephone Encounter (Signed)
Done hardcopy to robin  

## 2012-09-06 ENCOUNTER — Telehealth: Payer: Self-pay | Admitting: Cardiovascular Disease

## 2012-09-06 MED ORDER — METOPROLOL TARTRATE 25 MG PO TABS: ORAL_TABLET | ORAL | Status: DC

## 2012-09-06 NOTE — Telephone Encounter (Signed)
REFILL  PHONED IN   TODAY  WAS SENT VIA [facility] YESTERDAY PER PHARMACISTS  DID NOT RECEIVE .Zack Seal

## 2012-09-06 NOTE — Telephone Encounter (Signed)
New Problem:    Called in requesting a refill of the patient's metoprolol tartrate (LOPRESSOR) 25 MG tablet.  Please call back.

## 2012-09-06 NOTE — Telephone Encounter (Signed)
Follow Up     Pt calling following up on a refill request for METOPROLOL. Please call.

## 2012-09-19 ENCOUNTER — Encounter: Payer: Self-pay | Admitting: Cardiovascular Disease

## 2012-09-19 ENCOUNTER — Ambulatory Visit (INDEPENDENT_AMBULATORY_CARE_PROVIDER_SITE_OTHER): Payer: Medicare Other | Admitting: Cardiovascular Disease

## 2012-09-19 VITALS — BP 130/78 | HR 70 | Wt 193.0 lb

## 2012-09-19 DIAGNOSIS — I779 Disorder of arteries and arterioles, unspecified: Secondary | ICD-10-CM

## 2012-09-19 DIAGNOSIS — I4891 Unspecified atrial fibrillation: Secondary | ICD-10-CM

## 2012-09-19 DIAGNOSIS — I480 Paroxysmal atrial fibrillation: Secondary | ICD-10-CM

## 2012-09-19 DIAGNOSIS — E785 Hyperlipidemia, unspecified: Secondary | ICD-10-CM

## 2012-09-19 DIAGNOSIS — R609 Edema, unspecified: Secondary | ICD-10-CM

## 2012-09-19 DIAGNOSIS — Z951 Presence of aortocoronary bypass graft: Secondary | ICD-10-CM

## 2012-09-19 NOTE — Assessment & Plan Note (Signed)
Dependant post vein harvest and varicose veins.  PRN diuretic and low sodium diet

## 2012-09-19 NOTE — Assessment & Plan Note (Signed)
Maint NSR continue current meds

## 2012-09-19 NOTE — Assessment & Plan Note (Signed)
Cholesterol is at goal.  Continue current dose of statin and diet Rx.  No myalgias or side effects.  F/U  LFT's in 6 months. No results found for this basename: LDLCALC             

## 2012-09-19 NOTE — Assessment & Plan Note (Signed)
Stable with no angina and good activity level.  Continue medical Rx  

## 2012-09-19 NOTE — Progress Notes (Signed)
Patient ID: Matthew Bridges, male   DOB: February 24, 1931, 79 y.o.   MRN: [id number] Debilitated 79 yo post CABG 03/31/11 Post op afib. Had malaise and low HR. Lopresser decreased. Has a tremor that seems to be worse since CABG. Needs F/U with neuro. No SSCP. Wants to drive and I think 8 weeks next week he will be fine to drive. LE edema resolved. Sternum well healed. Voice still a little hoarse.  Known carotid disease. Duplex done 1/14  and reviewed  40-59% RICA and 60-79% LICA Systolic velocity on left is 3.38cm  His dizzyness is related to lorazapam that he has taken for 15 years. He saw Dr Graciela Husbands who did not think tilt table testing needed. He has a very labile personality with outbursts of agression and I am not sure that his psychiatric condition has been fully evaluated.   His swimmy headed feeling is an inner ear issue  ROS: Denies fever, malais, weight loss, blurry vision, decreased visual acuity, cough, sputum, SOB, hemoptysis, pleuritic pain, palpitaitons, heartburn, abdominal pain, melena, lower extremity edema, claudication, or rash.  All other systems reviewed and negative  General: Affect appropriate Healthy:  appears stated age HEENT: normal Neck supple with no adenopathy JVP normal left  bruits no thyromegaly Lungs clear with no wheezing and good diaphragmatic motion Heart:  S1/S2 no murmur, no rub, gallop or click PMI normal Abdomen: benighn, BS positve, no tenderness, no AAA no bruit.  No HSM or HJR Distal pulses intact with no bruits Varicose veins RLE vein harvest plus one bilateral  edema Neuro non-focal Skin warm and dry No muscular weakness   Current Outpatient Prescriptions  Medication Sig Dispense Refill  . albuterol (PROAIR HFA) 108 (90 BASE) MCG/ACT inhaler Inhale 2 puffs into the lungs 4 (four) times daily as needed. For shortness of breath      . aspirin 81 MG tablet Take 81 mg by mouth daily.      . beclomethasone (QVAR) 80 MCG/ACT inhaler Inhale 2 puffs into the  lungs 2 (two) times daily.      . bisacodyl (DULCOLAX) 5 MG EC tablet Take 15 mg by mouth daily as needed.      Marland Kitchen LORazepam (ATIVAN) 0.5 MG tablet TAKE ONE TABLET BY MOUTH TWICE DAILY AS NEEDED FOR ANXIETY.  60 tablet  5  . metoprolol tartrate (LOPRESSOR) 25 MG tablet 1/2 tab twice per day  30 tablet  11  . omeprazole (PRILOSEC) 20 MG capsule Take 20 mg by mouth daily.       No current facility-administered medications for this visit.    Allergies  Alprazolam; Codeine; Penicillins; and Tramadol  Electrocardiogram:  Assessment and Plan

## 2012-09-19 NOTE — Assessment & Plan Note (Signed)
F/U duplex this month No change in bruit

## 2012-09-19 NOTE — Patient Instructions (Addendum)
The current medical regimen is effective;  continue present plan and medications.  Follow up in 6 months with Dr Nishan.  You will receive a letter in the mail 2 months before you are due.  Please call us when you receive this letter to schedule your follow up appointment.   

## 2012-10-02 ENCOUNTER — Encounter (INDEPENDENT_AMBULATORY_CARE_PROVIDER_SITE_OTHER): Payer: Medicare Other

## 2012-10-02 DIAGNOSIS — I6529 Occlusion and stenosis of unspecified carotid artery: Secondary | ICD-10-CM

## 2012-10-15 ENCOUNTER — Ambulatory Visit: Payer: Medicare Other | Admitting: Internal Medicine

## 2012-10-16 ENCOUNTER — Ambulatory Visit: Payer: Medicare Other | Admitting: Critical Care Medicine

## 2012-11-13 ENCOUNTER — Encounter: Payer: Self-pay | Admitting: Critical Care Medicine

## 2012-11-13 ENCOUNTER — Ambulatory Visit (INDEPENDENT_AMBULATORY_CARE_PROVIDER_SITE_OTHER): Payer: Medicare Other | Admitting: Critical Care Medicine

## 2012-11-13 VITALS — BP 128/76 | HR 75 | Temp 97.6°F | Ht 71.0 in | Wt 210.4 lb

## 2012-11-13 DIAGNOSIS — J8410 Pulmonary fibrosis, unspecified: Secondary | ICD-10-CM

## 2012-11-13 MED ORDER — BUDESONIDE 180 MCG/ACT IN AEPB: 2.0000 | INHALATION_SPRAY | Freq: Two times a day (BID) | RESPIRATORY_TRACT | Status: DC

## 2012-11-13 NOTE — Progress Notes (Signed)
Subjective:    Patient ID: Matthew Bridges, male    DOB: 1930-06-23, 79 y.o.   MRN: [id number]  HPI  79 y.o.   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 2 month follow up    Has good days and bad days.  Reports shallow breathing at times, prod cough with white mucus, and chest tightness at times.     At the last ov we resumed Qvar.  Still on Qvar and helps. Has good and bad days. Notes some cough in early AM Some chest tightness and shallow breathing No real wheeze.  Past Medical History  Diagnosis Date  . Bronchiectasis   . Nephrolithiasis   . Pneumonia   . Hyperlipidemia   . Insomnia   . Alcohol abuse   . Asthmatic bronchitis   . Pleurisy   . GERD (gastroesophageal reflux disease)   . Diverticulosis     Colonoscopy 2007/Dr. Leone Payor  . Hx of colonic polyp     tubular adenoma (05/16/2009), hyperplastic polyp  . H/O hiatal hernia   . Blood transfusion   . Anemia   . 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 Arthritis   . Anxiety   . Shortness of breath   . Coronary artery disease     sees Dr. Eden Emms, saw 1 month ago  . COPD (chronic obstructive pulmonary disease)     sees Dr/ Delford Field, saw last Dec 2012  . S/P CABG (coronary artery bypass graft)   . Lumbar disc disease 10/02/2011  . Emotional lability 10/02/2011  . Chronic low back pain 10/02/2011  . BPH (benign prostatic hyperplasia) 10/02/2011  . B12 deficiency 01/01/2012  . Orthostatic hypotension 03/29/2012     Family History  Problem Relation Age of Onset  . Heart disease Mother   . Colon cancer Father   . Alcohol abuse Father   . Colon cancer Brother   . Lung cancer Brother   . Breast cancer Sister   . Heart disease Other     maternal side      History   Social History  . Marital Status: Married    Spouse Name:  N/A    Number of Children: 3  . Years of Education: N/A   Occupational History  . Retired    Social History Main Topics  . Smoking status: Former Smoker -- 1.00 packs/day for 10 years    Types: Cigarettes    Quit date: 03/20/1956  . Smokeless tobacco: Former Neurosurgeon    Types: Snuff, Chew    Quit date: 03/20/1948  . Alcohol Use: No  . Drug Use: No  . Sexual Activity: Not on file   Other Topics Concern  . Not on file   Social History Narrative  . No narrative on file     Allergies  Allergen Reactions  . Alprazolam Other (See Comments)    Confusion, weakness  . Codeine Other (See Comments)    Makes him feel dizzy, out of it  . Penicillins Hives  . Tramadol     Zombie state     Outpatient Prescriptions Prior to Visit  Medication Sig Dispense Refill  . albuterol (PROAIR HFA) 108 (90 BASE) MCG/ACT inhaler Inhale 2 puffs into the lungs 4 (four) times daily as needed. For shortness of breath      .  aspirin 81 MG tablet Take 81 mg by mouth daily.      . bisacodyl (DULCOLAX) 5 MG EC tablet Take 15 mg by mouth daily as needed.      Marland Kitchen LORazepam (ATIVAN) 0.5 MG tablet TAKE ONE TABLET BY MOUTH TWICE DAILY AS NEEDED FOR ANXIETY.  60 tablet  5  . metoprolol tartrate (LOPRESSOR) 25 MG tablet 1/2 tab twice per day  30 tablet  11  . omeprazole (PRILOSEC) 20 MG capsule Take 20 mg by mouth daily.      . beclomethasone (QVAR) 80 MCG/ACT inhaler Inhale 2 puffs into the lungs 2 (two) times daily.       No facility-administered medications prior to visit.      Review of Systems  Constitutional:   No  weight loss, night sweats,  Fevers, chills, + fatigue, lassitude. HEENT:   No headaches,  Difficulty swallowing,  Tooth/dental problems,  Sore throat,                No sneezing, itching, ear ache, nasal congestion, post nasal drip,   CV:  Notes  chest pain,  Orthopnea, PND, swelling in lower extremities, anasarca, dizziness, palpitations  GI  No heartburn, indigestion, abdominal pain,  nausea, vomiting, diarrhea, change in bowel habits, loss of appetite  Resp: less   shortness of breath with exertion or at rest.  No excess mucus, no   productive cough,   No coughing up of blood.  No  change in color of mucus. No chest wall deformity  Skin: no rash or lesions.  GU: no dysuria, change in color of urine, no urgency or frequency.  No flank pain.  MS:  No joint pain or swelling.  No decreased range of motion.  No back pain.  Psych:  No change in mood or affect. No depression or anxiety.  No memory loss.     Objective:   Physical Exam  Filed Vitals:   11/13/12 1409  BP: 128/76  Pulse: 75  Temp: 97.6 F (36.4 C)  TempSrc: Oral  Height: 5\' 11"  (1.803 m)  Weight: 210 lb 6.4 oz (95.437 kg)  SpO2: 98%    Gen: Pleasant, well-nourished, in no distress,  normal affect  ENT: No lesions,  mouth clear,  oropharynx clear, no postnasal drip  Neck: No JVD, no TMG, no carotid bruits  Lungs: No use of accessory muscles, no dullness to percussion, expired wheezes.  Cardiovascular: RRR, heart sounds normal, no murmur or gallops, no peripheral edema  Abdomen: soft and NT, no HSM,  BS normal  Musculoskeletal: No deformities, no cyanosis or clubbing  Neuro: alert, non focal  Skin: Warm, no lesions or rashes      No results found.   Assessment & Plan:   Hypersensitivity pneumonitis with associated bronchiectasis History of hypersensitivity pneumonitis with associated bronchiectasis stable at this time on inhaled steroid This patient's unable to afford the inhaled steroids and so we have been sampling this patient medications Plan Switch Qvar to Pulmicort as a sample supply we have at this time 2 puff twice daily    Updated Medication List Outpatient Encounter Prescriptions as of 11/13/2012  Medication Sig Dispense Refill  . albuterol (PROAIR HFA) 108 (90 BASE) MCG/ACT inhaler Inhale 2 puffs into the lungs 4 (four) times daily as needed. For shortness of breath       . aspirin 81 MG tablet Take 81 mg by mouth daily.      . bisacodyl (DULCOLAX) 5 MG EC tablet Take  15 mg by mouth daily as needed.      Marland Kitchen LORazepam (ATIVAN) 0.5 MG tablet TAKE ONE TABLET BY MOUTH TWICE DAILY AS NEEDED FOR ANXIETY.  60 tablet  5  . metoprolol tartrate (LOPRESSOR) 25 MG tablet 1/2 tab twice per day  30 tablet  11  . omeprazole (PRILOSEC) 20 MG capsule Take 20 mg by mouth daily.      . [DISCONTINUED] beclomethasone (QVAR) 80 MCG/ACT inhaler Inhale 2 puffs into the lungs 2 (two) times daily.      . budesonide (PULMICORT FLEXHALER) 180 MCG/ACT inhaler Inhale 2 puffs into the lungs 2 (two) times daily.  1 each  1   No facility-administered encounter medications on file as of 11/13/2012.

## 2012-11-13 NOTE — Patient Instructions (Addendum)
Change to pulmicort two puff twice daily, use samples Return 3 months Remember flu  Vaccine this fall

## 2012-11-13 NOTE — Assessment & Plan Note (Signed)
History of hypersensitivity pneumonitis with associated bronchiectasis stable at this time on inhaled steroid This patient's unable to afford the inhaled steroids and so we have been sampling this patient medications Plan Switch Qvar to Pulmicort as a sample supply we have at this time 2 puff twice daily

## 2012-11-19 ENCOUNTER — Telehealth: Payer: Self-pay | Admitting: Cardiovascular Disease

## 2012-11-19 DIAGNOSIS — R609 Edema, unspecified: Secondary | ICD-10-CM

## 2012-11-19 NOTE — Telephone Encounter (Signed)
New Prob  Pt is stating he is experiencing swelling in both of his legs but the left more so. He would like to know what to do.

## 2012-11-19 NOTE — Telephone Encounter (Signed)
Returned call to patient spoke to wife she stated husband is having swelling in both feet and lower legs,abdomen.Left leg is worse.No sob.Stated she has been giving husband her lasix 40 mg 1/2 tablet as needed and has had 20 mg x 3 doses with no relief.Stated he weighed 193lbs at office visit 09/19/12 and weighed 210 lbs 11/13/12 at Dr.Wright's office visit.Message sent to Dr.Nishan for advice.

## 2012-11-20 NOTE — Telephone Encounter (Signed)
lmtcb ./cy 

## 2012-11-20 NOTE — Telephone Encounter (Signed)
SPOKE WITH PT'S WIFE  WEIGHED TODAY AND WEIGHT IS BACK DOWN TO 194  WHICH IS DOWN FROM  210  PER WIFE HAS GIVEN  PT SOME OF HER LASIX 20 MG  QD  PRN    BECAUSE OF SOME SWELLING .LEGS ARE SWOLLEN EVERY DAY  HAS HELPED SOME  PER WIFE  STATED SHE WAS TO CALL IF EDEMA CONT MAY NEED TO START PT ON FLUID PILL WILL FORWARD TO  DR Eden Emms FOR  REVIEW

## 2012-11-25 MED ORDER — FUROSEMIDE 20 MG PO TABS: 20.0000 mg | ORAL_TABLET | Freq: Every day | ORAL | Status: DC | PRN

## 2012-11-25 NOTE — Telephone Encounter (Signed)
PT'S WIFE AWARE ECHO SCHEDULED  AND MED FILLED VIA EPIC .Zack Seal

## 2012-11-25 NOTE — Telephone Encounter (Signed)
Needs f/u echo to assess EF given swelling/edema.  If she wants script for Lasix 20mg  PRN edema that is fine

## 2012-11-28 ENCOUNTER — Other Ambulatory Visit (HOSPITAL_COMMUNITY): Payer: Medicare Other

## 2012-12-12 ENCOUNTER — Ambulatory Visit (HOSPITAL_COMMUNITY): Payer: Medicare Other | Attending: Cardiovascular Disease

## 2012-12-12 DIAGNOSIS — I079 Rheumatic tricuspid valve disease, unspecified: Secondary | ICD-10-CM | POA: Insufficient documentation

## 2012-12-12 DIAGNOSIS — R609 Edema, unspecified: Secondary | ICD-10-CM | POA: Insufficient documentation

## 2012-12-12 DIAGNOSIS — I059 Rheumatic mitral valve disease, unspecified: Secondary | ICD-10-CM | POA: Insufficient documentation

## 2012-12-12 NOTE — Progress Notes (Signed)
Echocardiogram performed.  

## 2012-12-16 ENCOUNTER — Telehealth: Payer: Self-pay | Admitting: Critical Care Medicine

## 2012-12-16 MED ORDER — PREDNISONE 10 MG PO TABS: ORAL_TABLET | ORAL | Status: DC

## 2012-12-16 NOTE — Telephone Encounter (Signed)
Late Entry: Spoke with patient in the lobby. Patient states he was given Pulmicort inh samples at last OV 11/13/12 Patient has since ran out of samples and states this inhaler is too expensive for him to purchase Per patient his wife is sick and having to pay for MD visit and medication for both of them, it is just very difficult I searched for samples to provide patient but we are out Dr. Delford Field please advise if patient can be switched to something more affordable or best direction to take. Thank you!

## 2012-12-16 NOTE — Telephone Encounter (Signed)
I looked and did not see any samples. Pt stated he is fine to have the prednisone 10 mg called in for him. He stated he just can't afford those inhalers. RX sent. Nothing further needed

## 2012-12-16 NOTE — Telephone Encounter (Signed)
All the steroid inhalers cost the same  Find some samples or the only other option is to Rx prednisone 10mg  daily

## 2012-12-24 ENCOUNTER — Telehealth: Payer: Self-pay | Admitting: Internal Medicine

## 2012-12-24 NOTE — Telephone Encounter (Signed)
Phone is busy I will continue to try and reach the patient  

## 2012-12-24 NOTE — Telephone Encounter (Signed)
Phone is still busy.  I will periodically continue to try and reach the patient

## 2012-12-25 NOTE — Telephone Encounter (Signed)
Patient with continued constipation and pain after a BM.  He is scheduled for an appt with Dr. Leone Payor for 10/28 2:15

## 2013-01-01 ENCOUNTER — Ambulatory Visit: Payer: Medicare Other

## 2013-01-03 ENCOUNTER — Ambulatory Visit (INDEPENDENT_AMBULATORY_CARE_PROVIDER_SITE_OTHER): Payer: Medicare Other

## 2013-01-03 DIAGNOSIS — Z23 Encounter for immunization: Secondary | ICD-10-CM

## 2013-01-09 ENCOUNTER — Encounter: Payer: Self-pay | Admitting: Internal Medicine

## 2013-01-09 ENCOUNTER — Ambulatory Visit (INDEPENDENT_AMBULATORY_CARE_PROVIDER_SITE_OTHER): Payer: Medicare Other | Admitting: Internal Medicine

## 2013-01-09 ENCOUNTER — Ambulatory Visit (INDEPENDENT_AMBULATORY_CARE_PROVIDER_SITE_OTHER): Payer: Medicare Other

## 2013-01-09 VITALS — BP 108/62 | HR 67 | Temp 98.2°F | Ht 71.0 in | Wt 200.8 lb

## 2013-01-09 DIAGNOSIS — N400 Enlarged prostate without lower urinary tract symptoms: Secondary | ICD-10-CM

## 2013-01-09 DIAGNOSIS — I4891 Unspecified atrial fibrillation: Secondary | ICD-10-CM | POA: Diagnosis not present

## 2013-01-09 DIAGNOSIS — Z0000 Encounter for general adult medical examination without abnormal findings: Secondary | ICD-10-CM

## 2013-01-09 DIAGNOSIS — M25519 Pain in unspecified shoulder: Secondary | ICD-10-CM

## 2013-01-09 DIAGNOSIS — E785 Hyperlipidemia, unspecified: Secondary | ICD-10-CM | POA: Diagnosis not present

## 2013-01-09 DIAGNOSIS — M25511 Pain in right shoulder: Secondary | ICD-10-CM

## 2013-01-09 DIAGNOSIS — Z23 Encounter for immunization: Secondary | ICD-10-CM

## 2013-01-09 DIAGNOSIS — R21 Rash and other nonspecific skin eruption: Secondary | ICD-10-CM

## 2013-01-09 DIAGNOSIS — R269 Unspecified abnormalities of gait and mobility: Secondary | ICD-10-CM

## 2013-01-09 LAB — TSH: TSH: 0.96 u[IU]/mL (ref 0.35–5.50)

## 2013-01-09 LAB — BASIC METABOLIC PANEL
BUN: 15 mg/dL (ref 6–23)
CO2: 29 mEq/L (ref 19–32)
Calcium: 9.2 mg/dL (ref 8.4–10.5)
Glucose, Bld: 100 mg/dL — ABNORMAL HIGH (ref 70–99)
Sodium: 139 mEq/L (ref 135–145)

## 2013-01-09 LAB — HEPATIC FUNCTION PANEL
AST: 18 U/L (ref 0–37)
Albumin: 4 g/dL (ref 3.5–5.2)
Alkaline Phosphatase: 64 U/L (ref 39–117)
Bilirubin, Direct: 0.1 mg/dL (ref 0.0–0.3)
Total Bilirubin: 0.8 mg/dL (ref 0.3–1.2)
Total Protein: 7.3 g/dL (ref 6.0–8.3)

## 2013-01-09 LAB — CBC WITH DIFFERENTIAL/PLATELET
Basophils Absolute: 0.1 10*3/uL (ref 0.0–0.1)
Basophils Relative: 0.6 % (ref 0.0–3.0)
Eosinophils Relative: 11.2 % — ABNORMAL HIGH (ref 0.0–5.0)
HCT: 40.8 % (ref 39.0–52.0)
Hemoglobin: 13.7 g/dL (ref 13.0–17.0)
Lymphocytes Relative: 29.2 % (ref 12.0–46.0)
Lymphs Abs: 3 10*3/uL (ref 0.7–4.0)
MCV: 94 fl (ref 78.0–100.0)
Monocytes Absolute: 0.5 10*3/uL (ref 0.1–1.0)
Monocytes Relative: 5.3 % (ref 3.0–12.0)
Platelets: 151 10*3/uL (ref 150.0–400.0)
RBC: 4.34 Mil/uL (ref 4.22–5.81)
RDW: 14.2 % (ref 11.5–14.6)
WBC: 10.3 10*3/uL (ref 4.5–10.5)

## 2013-01-09 LAB — LIPID PANEL
HDL: 53.2 mg/dL (ref >39.00)
Triglycerides: 116 mg/dL (ref 0.0–149.0)

## 2013-01-09 MED ORDER — CLOTRIMAZOLE-BETAMETHASONE 1-0.05 % EX CREA: TOPICAL_CREAM | CUTANEOUS | Status: DC

## 2013-01-09 MED ORDER — TAMSULOSIN HCL 0.4 MG PO CAPS: 0.4000 mg | ORAL_CAPSULE | Freq: Every day | ORAL | Status: DC

## 2013-01-09 NOTE — Patient Instructions (Addendum)
You had the new Prevnar pneumonia shot today Please take all new medication as prescribed - the flomax, and the cream Please call if you need the Urology referral for the prostate, and Dr Katrinka Blazing for the shoulder  You will be contacted regarding the referral for: Physical therapy Your Left ear was irrigated of wax today  Please go to the LAB in the Basement (turn left off the elevator) for the tests to be done today You will be contacted by phone if any changes need to be made immediately.  Otherwise, you will receive a letter about your results with an explanation, but please check with MyChart first.  Please return in 6 months, or sooner if needed

## 2013-01-09 NOTE — Progress Notes (Signed)
Subjective:    Patient ID: Matthew Bridges, male    DOB: 02-21-1931, 79 y.o.   MRN: [id number]  HPI  Here for wellness and f/u with wife;  Overall doing ok;  Pt denies CP, worsening SOB, DOE, wheezing, orthopnea, PND, worsening LE edema, palpitations, dizziness or syncope.  Pt denies neurological change such as new headache, facial or extremity weakness.  Pt denies polydipsia, polyuria, or low sugar symptoms. Pt states overall good compliance with treatment and medications, good tolerability, and has been trying to follow lower cholesterol diet.  Pt denies worsening depressive symptoms, suicidal ideation or panic. No fever, night sweats, wt loss, loss of appetite, or other constitutional symptoms.  Pt states good ability with ADL's, has low fall risk, home safety reviewed and adequate, no other significant changes in hearing or vision, and not active with exercise, infact more generally weak recently, appears to need home PT due to risk of fall.    Does have some increased difficulty gradually this past yr with passing urine, and nocturia x 3 times per night, disrupting sleep. but Denies urinary symptoms such as dysuria, frequency, urgency, flank pain, hematuria or n/v, fever, chills.  Has a rash to post left calf nontender, no trauma Past Medical History  Diagnosis Date  . Bronchiectasis   . Nephrolithiasis   . Pneumonia   . Hyperlipidemia   . Insomnia   . Alcohol abuse   . Asthmatic bronchitis   . Pleurisy   . GERD (gastroesophageal reflux disease)   . Diverticulosis     Colonoscopy 2007/Dr. Leone Payor  . Hx of colonic polyp     tubular adenoma (05/16/2009), hyperplastic polyp  . H/O hiatal hernia   . Blood transfusion   . Anemia   . 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 Arthritis   . Anxiety   . Shortness of breath   . Coronary artery disease     sees Dr. Eden Emms, saw 1 month ago  . COPD (chronic obstructive pulmonary disease)     sees Dr/ Delford Field, saw last Dec 2012   . S/P CABG (coronary artery bypass graft)   . Lumbar disc disease 10/02/2011  . Emotional lability 10/02/2011  . Chronic low back pain 10/02/2011  . BPH (benign prostatic hyperplasia) 10/02/2011  . B12 deficiency 01/01/2012  . Orthostatic hypotension 03/29/2012   Past Surgical History  Procedure Laterality Date  . Tonsillectomy    . Cataract extraction      bilateral  . Kidney stone surgery      removal of kidney stone  . Cardiac catheterization      Friday 4, 2013  . Coronary artery bypass graft  03/31/2011    Procedure: CORONARY ARTERY BYPASS GRAFTING (CABG);  Surgeon: [name], MD;  Location: [facility] OR;  Service: Open Heart Surgery;  Laterality: N/A;  Times three using the mammary and right leg greater saphenous vein.   . Colonoscopy      multiple    reports that he quit smoking about 56 years ago. His smoking use included Cigarettes. He has a 10 pack-year smoking history. He quit smokeless tobacco use about 64 years ago. His smokeless tobacco use included Snuff and Chew. He reports that he does not drink alcohol or use illicit drugs. family history includes Alcohol abuse in his father; Breast cancer in his sister; Colon cancer in his brother and father; Heart disease in his mother and other; Lung cancer in his brother. Allergies  Allergen Reactions  .  Alprazolam Other (See Comments)    Confusion, weakness  . Codeine Other (See Comments)    Makes him feel dizzy, out of it  . Penicillins Hives  . Tramadol     Zombie state   Current Outpatient Prescriptions on File Prior to Visit  Medication Sig Dispense Refill  . albuterol (PROAIR HFA) 108 (90 BASE) MCG/ACT inhaler Inhale 2 puffs into the lungs 4 (four) times daily as needed. For shortness of breath      . aspirin 81 MG tablet Take 81 mg by mouth daily.      . bisacodyl (DULCOLAX) 5 MG EC tablet Take 15 mg by mouth daily as needed.      . budesonide (PULMICORT FLEXHALER) 180 MCG/ACT inhaler Inhale 2 puffs into the lungs 2 (two)  times daily.  1 each  1  . furosemide (LASIX) 20 MG tablet Take 1 tablet (20 mg total) by mouth daily as needed.  30 tablet  6  . LORazepam (ATIVAN) 0.5 MG tablet TAKE ONE TABLET BY MOUTH TWICE DAILY AS NEEDED FOR ANXIETY.  60 tablet  5  . metoprolol tartrate (LOPRESSOR) 25 MG tablet 1/2 tab twice per day  30 tablet  11  . omeprazole (PRILOSEC) 20 MG capsule Take 20 mg by mouth daily.      . predniSONE (DELTASONE) 10 MG tablet Take 1 tablet daily  30 tablet  0   No current facility-administered medications on file prior to visit.   Review of Systems Constitutional: Negative for diaphoresis, activity change, appetite change or unexpected weight change.  HENT: Negative for hearing loss, ear pain, facial swelling, mouth sores and neck stiffness.   Eyes: Negative for pain, redness and visual disturbance.  Respiratory: Negative for shortness of breath and wheezing.   Cardiovascular: Negative for chest pain and palpitations.  Gastrointestinal: Negative for diarrhea, blood in stool, abdominal distention or other pain Genitourinary: Negative for hematuria, flank pain or change in urine volume.  Musculoskeletal: Negative for myalgias and joint swelling.  Skin: Negative for color change and wound.  Neurological: Negative for syncope and numbness. other than noted Hematological: Negative for adenopathy.  Psychiatric/Behavioral: Negative for hallucinations, self-injury, decreased concentration and agitation.      Objective:   Physical Exam BP 108/62  Pulse 67  Temp(Src) 98.2 F (36.8 C) (Oral)  Ht 5\' 11"  (1.803 m)  Wt 200 lb 12 oz (91.06 kg)  BMI 28.01 kg/m2  SpO2 97% VS noted,  Constitutional: Pt is oriented to person, place, and time. Appears well-developed and well-nourished.  Head: Normocephalic and atraumatic.  Right Ear: External ear normal.  Left Ear: External ear normal.  Nose: Nose normal.  Mouth/Throat: Oropharynx is clear and moist.  Eyes: Conjunctivae and EOM are normal.  Pupils are equal, round, and reactive to light.  Neck: Normal range of motion. Neck supple. No JVD present. No tracheal deviation present.  Cardiovascular: Normal rate, regular rhythm, normal heart sounds and intact distal pulses.   Pulmonary/Chest: Effort normal and breath sounds normal.  Abdominal: Soft. Bowel sounds are normal. There is no tenderness. No HSM , but has palpable bladder low mid abd Musculoskeletal: Normal range of motion. Exhibits no edema.  Lymphadenopathy:  Has no cervical adenopathy.  Neurological: Pt is alert and oriented to person, place, and time. Pt has normal reflexes. No cranial nerve deficit.  Skin: Skin is warm and dry.2 cm erythem rash post left calf, slightly raised, plaquelike  Psychiatric:  Has  normal mood and affect. Behavior is normal.  Assessment & Plan:

## 2013-01-12 DIAGNOSIS — R21 Rash and other nonspecific skin eruption: Secondary | ICD-10-CM

## 2013-01-12 HISTORY — DX: Rash and other nonspecific skin eruption: R21

## 2013-01-12 NOTE — Assessment & Plan Note (Signed)
Declines sports med referral

## 2013-01-12 NOTE — Assessment & Plan Note (Signed)
For lotrisone asd prn

## 2013-01-12 NOTE — Assessment & Plan Note (Signed)

## 2013-01-12 NOTE — Assessment & Plan Note (Signed)
Now more symptomatic, for flomax asd, declines urology referral

## 2013-01-12 NOTE — Assessment & Plan Note (Signed)
For home pT

## 2013-01-14 ENCOUNTER — Ambulatory Visit: Payer: Medicare Other | Admitting: Internal Medicine

## 2013-01-16 ENCOUNTER — Other Ambulatory Visit: Payer: Self-pay | Admitting: Internal Medicine

## 2013-01-16 ENCOUNTER — Encounter: Payer: Self-pay | Admitting: Internal Medicine

## 2013-01-16 LAB — URINALYSIS, ROUTINE W REFLEX MICROSCOPIC
Leukocytes, UA: NEGATIVE
Nitrite: NEGATIVE
Specific Gravity, Urine: 1.02 (ref 1.000–1.030)
Urobilinogen, UA: 0.2 (ref 0.0–1.0)
pH: 6 (ref 5.0–8.0)

## 2013-01-16 MED ORDER — PRAVASTATIN SODIUM 20 MG PO TABS: 20.0000 mg | ORAL_TABLET | Freq: Every day | ORAL | Status: DC

## 2013-01-22 ENCOUNTER — Ambulatory Visit: Payer: Medicare Other | Admitting: Physical Therapy

## 2013-02-10 ENCOUNTER — Other Ambulatory Visit: Payer: Self-pay | Admitting: Internal Medicine

## 2013-02-11 NOTE — Telephone Encounter (Signed)
Done hardcopy to robin  

## 2013-02-11 NOTE — Telephone Encounter (Signed)
Faxed hardcopy to [facility] [place] 

## 2013-02-18 ENCOUNTER — Ambulatory Visit: Payer: Medicare Other | Admitting: Internal Medicine

## 2013-03-08 ENCOUNTER — Other Ambulatory Visit: Payer: Self-pay | Admitting: Internal Medicine

## 2013-03-17 ENCOUNTER — Ambulatory Visit: Payer: Medicare Other | Admitting: Cardiovascular Disease

## 2013-04-04 ENCOUNTER — Other Ambulatory Visit: Payer: Self-pay

## 2013-04-04 MED ORDER — OMEPRAZOLE 20 MG PO CPDR: 20.0000 mg | DELAYED_RELEASE_CAPSULE | Freq: Every day | ORAL | Status: DC

## 2013-04-09 ENCOUNTER — Ambulatory Visit (INDEPENDENT_AMBULATORY_CARE_PROVIDER_SITE_OTHER): Payer: Medicare HMO | Admitting: Critical Care Medicine

## 2013-04-09 ENCOUNTER — Encounter: Payer: Self-pay | Admitting: Critical Care Medicine

## 2013-04-09 VITALS — BP 124/68 | HR 66 | Ht 71.0 in | Wt 204.0 lb

## 2013-04-09 DIAGNOSIS — J8410 Pulmonary fibrosis, unspecified: Secondary | ICD-10-CM

## 2013-04-09 MED ORDER — ALBUTEROL SULFATE HFA 108 (90 BASE) MCG/ACT IN AERS: 2.0000 | INHALATION_SPRAY | Freq: Four times a day (QID) | RESPIRATORY_TRACT | Status: DC | PRN

## 2013-04-09 NOTE — Patient Instructions (Signed)
Stop pulmicort Use albuterol as needed Stay on prednisone 10mg  daily Return 6 months

## 2013-04-09 NOTE — Progress Notes (Signed)
Subjective:    Patient ID: Matthew RamsayHubert Niu, male    DOB: [birth year], 80 y.o.   MRN: [id number]  HPI  80 y.o.   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 Follow-up    Pt c/o SOB constantly, some prod cough with white mucous.    Cough is about the same. Notes some chest pain.  Since had heart surgery no real difference in dyspnea.  Mucus is white. No color. Pt will rinse mouth out in the AM.  Notes no real nasal drip. No sore throat.  Prilosec helps heartburn Pt remains dizzy and lightheaded  Past Medical History  Diagnosis Date  . Bronchiectasis   . Nephrolithiasis   . Pneumonia   . Hyperlipidemia   . Insomnia   . Alcohol abuse   . Asthmatic bronchitis   . Pleurisy   . GERD (gastroesophageal reflux disease)   . Diverticulosis     Colonoscopy 2007/Dr. Leone PayorGessner  . Hx of colonic polyp     tubular adenoma (05/16/2009), hyperplastic polyp  . H/O hiatal hernia   . Blood transfusion   . Anemia   . 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 Arthritis   . Anxiety   . Shortness of breath   . Coronary artery disease     sees Dr. Eden EmmsNishan, saw 1 month ago  . COPD (chronic obstructive pulmonary disease)     sees Dr/ Delford FieldWright, saw last Dec 2012  . S/P CABG (coronary artery bypass graft)   . Lumbar disc disease 10/02/2011  . Emotional lability 10/02/2011  . Chronic low back pain 10/02/2011  . BPH (benign prostatic hyperplasia) 10/02/2011  . B12 deficiency 01/01/2012  . Orthostatic hypotension 03/29/2012     Family History  Problem Relation Age of Onset  . Heart disease Mother   . Colon cancer Father   . Alcohol abuse Father   . Colon cancer Brother   . Lung cancer Brother   . Breast cancer Sister   . Heart disease Other     maternal side      History   Social History  . Marital Status:  Married    Spouse Name: N/A    Number of Children: 3  . Years of Education: N/A   Occupational History  . Retired    Social History Main Topics  . Smoking status: Former Smoker -- 1.00 packs/day for 10 years    Types: Cigarettes    Quit date: 03/20/1956  . Smokeless tobacco: Former NeurosurgeonUser    Types: Snuff, Chew    Quit date: 03/20/1948  . Alcohol Use: No  . Drug Use: No  . Sexual Activity: Not on file   Other Topics Concern  . Not on file   Social History Narrative  . No narrative on file     Allergies  Allergen Reactions  . Alprazolam Other (See Comments)    Confusion, weakness  . Codeine Other (See Comments)    Makes him feel dizzy, out of it  . Penicillins Hives  . Tramadol     Zombie state     Outpatient Prescriptions Prior to Visit  Medication Sig Dispense Refill  . aspirin 81 MG tablet Take 81 mg by mouth daily.      . bisacodyl (DULCOLAX) 5 MG EC tablet Take 15 mg  by mouth daily as needed.      . furosemide (LASIX) 20 MG tablet Take 1 tablet (20 mg total) by mouth daily as needed.  30 tablet  6  . LORazepam (ATIVAN) 0.5 MG tablet TAKE 1 TABLET BY MOUTH TWICE DAILY AS NEEDED FOR ANXIETY.  60 tablet  5  . metoprolol tartrate (LOPRESSOR) 25 MG tablet 1/2 tab twice per day  30 tablet  11  . omeprazole (PRILOSEC) 20 MG capsule Take 1 capsule (20 mg total) by mouth daily.  90 capsule  3  . pravastatin (PRAVACHOL) 20 MG tablet Take 1 tablet (20 mg total) by mouth daily.  90 tablet  3  . predniSONE (DELTASONE) 10 MG tablet Take 1 tablet daily  30 tablet  0  . tamsulosin (FLOMAX) 0.4 MG CAPS capsule Take 1 capsule (0.4 mg total) by mouth daily.  90 capsule  3  . budesonide (PULMICORT FLEXHALER) 180 MCG/ACT inhaler Inhale 2 puffs into the lungs 2 (two) times daily.  1 each  1  . albuterol (PROAIR HFA) 108 (90 BASE) MCG/ACT inhaler Inhale 2 puffs into the lungs 4 (four) times daily as needed. For shortness of breath      . clotrimazole-betamethasone (LOTRISONE) cream Use as  directed twice per day as needed  15 g  1   No facility-administered medications prior to visit.      Review of Systems  Constitutional:   No  weight loss, night sweats,  Fevers, chills, + fatigue, lassitude. HEENT:   No headaches,  Difficulty swallowing,  Tooth/dental problems,  Sore throat,                No sneezing, itching, ear ache, nasal congestion, post nasal drip,   CV:  Notes  chest pain,  Orthopnea, PND, swelling in lower extremities, anasarca, dizziness, palpitations  GI  No heartburn, indigestion, abdominal pain, nausea, vomiting, diarrhea, change in bowel habits, loss of appetite  Resp: less   shortness of breath with exertion or at rest.  No excess mucus, no   productive cough,   No coughing up of blood.  No  change in color of mucus. No chest wall deformity  Skin: no rash or lesions.  GU: no dysuria, change in color of urine, no urgency or frequency.  No flank pain.  MS:  No joint pain or swelling.  No decreased range of motion.  No back pain.  Psych:  No change in mood or affect. No depression or anxiety.  No memory loss.     Objective:   Physical Exam  Filed Vitals:   04/09/13 1221  BP: 124/68  Pulse: 66  Height: 5\' 11"  (1.803 m)  Weight: 204 lb (92.534 kg)  SpO2: 99%    Gen: Pleasant, well-nourished, in no distress,  normal affect  ENT: No lesions,  mouth clear,  oropharynx clear, no postnasal drip  Neck: No JVD, no TMG, no carotid bruits  Lungs: No use of accessory muscles, no dullness to percussion, nowheezes.  Cardiovascular: RRR, heart sounds normal, no murmur or gallops, no peripheral edema  Abdomen: soft and NT, no HSM,  BS normal  Musculoskeletal: No deformities, no cyanosis or clubbing  Neuro: alert, non focal  Skin: Warm, no lesions or rashes      No results found.   Assessment & Plan:   Hypersensitivity pneumonitis with associated bronchiectasis History of hypersensitivity pneumonitis now resolved underlying bronchiectasis  stable at this time the patient remains steroid dependent Plan Stop Pulmicort Maintain low-dose  prednisone Return 6 mo     Updated Medication List Outpatient Encounter Prescriptions as of 04/09/2013  Medication Sig  . aspirin 81 MG tablet Take 81 mg by mouth daily.  . bisacodyl (DULCOLAX) 5 MG EC tablet Take 15 mg by mouth daily as needed.  . furosemide (LASIX) 20 MG tablet Take 1 tablet (20 mg total) by mouth daily as needed.  Marland Kitchen LORazepam (ATIVAN) 0.5 MG tablet TAKE 1 TABLET BY MOUTH TWICE DAILY AS NEEDED FOR ANXIETY.  . metoprolol tartrate (LOPRESSOR) 25 MG tablet 1/2 tab twice per day  . omeprazole (PRILOSEC) 20 MG capsule Take 1 capsule (20 mg total) by mouth daily.  . pravastatin (PRAVACHOL) 20 MG tablet Take 1 tablet (20 mg total) by mouth daily.  . predniSONE (DELTASONE) 10 MG tablet Take 1 tablet daily  . tamsulosin (FLOMAX) 0.4 MG CAPS capsule Take 0.4 mg by mouth as needed.  . [DISCONTINUED] tamsulosin (FLOMAX) 0.4 MG CAPS capsule Take 1 capsule (0.4 mg total) by mouth daily.  Marland Kitchen albuterol (PROAIR HFA) 108 (90 BASE) MCG/ACT inhaler Inhale 2 puffs into the lungs 4 (four) times daily as needed. For shortness of breath  . budesonide (PULMICORT FLEXHALER) 180 MCG/ACT inhaler Inhale 2 puffs into the lungs 2 (two) times daily.  . [DISCONTINUED] albuterol (PROAIR HFA) 108 (90 BASE) MCG/ACT inhaler Inhale 2 puffs into the lungs 4 (four) times daily as needed. For shortness of breath  . [DISCONTINUED] clotrimazole-betamethasone (LOTRISONE) cream Use as directed twice per day as needed

## 2013-04-11 NOTE — Assessment & Plan Note (Signed)
History of hypersensitivity pneumonitis now resolved underlying bronchiectasis stable at this time the patient remains steroid dependent Plan Stop Pulmicort Maintain low-dose prednisone Return 6 mo

## 2013-04-15 ENCOUNTER — Encounter: Payer: Self-pay | Admitting: Cardiology

## 2013-04-15 ENCOUNTER — Ambulatory Visit (HOSPITAL_COMMUNITY): Payer: Medicare HMO | Attending: Cardiology

## 2013-04-15 DIAGNOSIS — I10 Essential (primary) hypertension: Secondary | ICD-10-CM | POA: Insufficient documentation

## 2013-04-15 DIAGNOSIS — R209 Unspecified disturbances of skin sensation: Secondary | ICD-10-CM | POA: Insufficient documentation

## 2013-04-15 DIAGNOSIS — I6529 Occlusion and stenosis of unspecified carotid artery: Secondary | ICD-10-CM

## 2013-04-15 DIAGNOSIS — Z951 Presence of aortocoronary bypass graft: Secondary | ICD-10-CM | POA: Insufficient documentation

## 2013-04-15 DIAGNOSIS — I658 Occlusion and stenosis of other precerebral arteries: Secondary | ICD-10-CM | POA: Insufficient documentation

## 2013-04-15 DIAGNOSIS — E785 Hyperlipidemia, unspecified: Secondary | ICD-10-CM | POA: Insufficient documentation

## 2013-04-15 DIAGNOSIS — I2510 Atherosclerotic heart disease of native coronary artery without angina pectoris: Secondary | ICD-10-CM | POA: Insufficient documentation

## 2013-04-15 DIAGNOSIS — Z87891 Personal history of nicotine dependence: Secondary | ICD-10-CM | POA: Insufficient documentation

## 2013-04-15 DIAGNOSIS — J449 Chronic obstructive pulmonary disease, unspecified: Secondary | ICD-10-CM | POA: Insufficient documentation

## 2013-04-15 DIAGNOSIS — J4489 Other specified chronic obstructive pulmonary disease: Secondary | ICD-10-CM | POA: Insufficient documentation

## 2013-04-15 DIAGNOSIS — Z8673 Personal history of transient ischemic attack (TIA), and cerebral infarction without residual deficits: Secondary | ICD-10-CM | POA: Insufficient documentation

## 2013-04-15 DIAGNOSIS — R42 Dizziness and giddiness: Secondary | ICD-10-CM | POA: Insufficient documentation

## 2013-04-22 ENCOUNTER — Ambulatory Visit: Payer: Medicare Other | Admitting: Cardiovascular Disease

## 2013-04-25 ENCOUNTER — Ambulatory Visit: Payer: Medicare HMO | Admitting: Internal Medicine

## 2013-04-25 ENCOUNTER — Ambulatory Visit: Payer: Medicare Other | Admitting: Internal Medicine

## 2013-05-21 ENCOUNTER — Encounter: Payer: Self-pay | Admitting: Cardiovascular Disease

## 2013-05-21 ENCOUNTER — Other Ambulatory Visit: Payer: Self-pay | Admitting: *Deleted

## 2013-05-21 ENCOUNTER — Ambulatory Visit (INDEPENDENT_AMBULATORY_CARE_PROVIDER_SITE_OTHER): Payer: Medicare HMO | Admitting: Cardiovascular Disease

## 2013-05-21 VITALS — BP 134/71 | HR 69 | Ht 71.0 in | Wt 206.0 lb

## 2013-05-21 DIAGNOSIS — I6529 Occlusion and stenosis of unspecified carotid artery: Secondary | ICD-10-CM

## 2013-05-21 DIAGNOSIS — I4891 Unspecified atrial fibrillation: Secondary | ICD-10-CM

## 2013-05-21 DIAGNOSIS — Z951 Presence of aortocoronary bypass graft: Secondary | ICD-10-CM

## 2013-05-21 DIAGNOSIS — I480 Paroxysmal atrial fibrillation: Secondary | ICD-10-CM

## 2013-05-21 DIAGNOSIS — I739 Peripheral vascular disease, unspecified: Secondary | ICD-10-CM

## 2013-05-21 DIAGNOSIS — E785 Hyperlipidemia, unspecified: Secondary | ICD-10-CM

## 2013-05-21 DIAGNOSIS — I779 Disorder of arteries and arterioles, unspecified: Secondary | ICD-10-CM

## 2013-05-21 NOTE — Assessment & Plan Note (Signed)
Stable with no angina and good activity level.  Continue medical Rx  

## 2013-05-21 NOTE — Assessment & Plan Note (Signed)
Cholesterol is at goal.  Continue current dose of statin and diet Rx.  No myalgias or side effects.  F/U  LFT's in 6 months. No results found for this basename: LDLCALC  Labs with primary            

## 2013-05-21 NOTE — Progress Notes (Signed)
Patient ID: Matthew RamsayHubert Cortina, male   DOB: [birth year], 80 y.o.   MRN: [id number] Debilitated 80 yo post CABG 03/31/11 Post op afib. Had malaise and low HR. Lopresser decreased. Has a tremor that seems to be worse since CABG. Needs F/U with neuro. No SSCP. Wants to drive and I think 8 weeks next week he will be fine to drive. LE edema resolved. Sternum well healed. Voice still a little hoarse.  Known carotid disease. Duplex done 1/15 and reviewed  60-79%  RICA and 60-79% LICA Systolic velocity on left is 3.1cm   His dizzyness is related to lorazapam that he has taken for 15 years. He saw Dr Graciela HusbandsKlein who did not think tilt table testing needed. He has a very labile personality with outbursts of agression and I am not sure that his psychiatric condition has been fully evaluated.  His swimmy headed feeling is an inner ear issue   Echo 12/12/12 EF 55-60% no vlave disease    ROS: Denies fever, malais, weight loss, blurry vision, decreased visual acuity, cough, sputum, SOB, hemoptysis, pleuritic pain, palpitaitons, heartburn, abdominal pain, melena, lower extremity edema, claudication, or rash.  All other systems reviewed and negative  General: Affect appropriate Healthy:  appears stated age HEENT: normal Neck supple with no adenopathy JVP normal bilateral  bruits no thyromegaly Lungs clear with no wheezing and good diaphragmatic motion Heart:  S1/S2 no murmur, no rub, gallop or click PMI normal Abdomen: benighn, BS positve, no tenderness, no AAA no bruit.  No HSM or HJR Distal pulses intact with no bruits No edema Neuro non-focal Skin warm and dry No muscular weakness   Current Outpatient Prescriptions  Medication Sig Dispense Refill  . albuterol (PROAIR HFA) 108 (90 BASE) MCG/ACT inhaler Inhale 2 puffs into the lungs 4 (four) times daily as needed. For shortness of breath  1 Inhaler  6  . aspirin 81 MG tablet Take 81 mg by mouth daily.      . bisacodyl (DULCOLAX) 5 MG EC tablet Take 15 mg by  mouth daily as needed.      . furosemide (LASIX) 20 MG tablet Take 1 tablet (20 mg total) by mouth daily as needed.  30 tablet  6  . LORazepam (ATIVAN) 0.5 MG tablet TAKE 1 TABLET BY MOUTH TWICE DAILY AS NEEDED FOR ANXIETY.  60 tablet  5  . metoprolol tartrate (LOPRESSOR) 25 MG tablet 1/2 tab twice per day  30 tablet  11  . omeprazole (PRILOSEC) 20 MG capsule Take 1 capsule (20 mg total) by mouth daily.  90 capsule  3  . pravastatin (PRAVACHOL) 20 MG tablet Take 1 tablet (20 mg total) by mouth daily.  90 tablet  3   No current facility-administered medications for this visit.    Allergies  Alprazolam; Codeine; Penicillins; and Tramadol  Electrocardiogram:  1/14  SR rate 94 low voltage nonspecific ST/T Wave changes   Assessment and Plan

## 2013-05-21 NOTE — Assessment & Plan Note (Signed)
Maint NSR continue ASA and beta blocker  

## 2013-05-21 NOTE — Patient Instructions (Signed)
Your physician wants you to follow-up in:   6  MONTHS  WITH   DR Eden EmmsNISHAN  CAROTID  SAME DAY   You will receive a reminder letter in the mail two months in advance. If you don't receive a letter, please call our office to schedule the follow-up appointment. Your physician recommends that you continue on your current medications as directed. Please refer to the Current Medication list given to you today.  Your physician has requested that you have a carotid duplex. This test is an ultrasound of the carotid arteries in your neck. It looks at blood flow through these arteries that supply the brain with blood. Allow one hour for this exam. There are no restrictions or special instructions. DUE IN  6 MONTHS

## 2013-05-21 NOTE — Assessment & Plan Note (Signed)
Stable by duplex bilateral 60-79% disease duplex in July

## 2013-07-10 ENCOUNTER — Encounter: Payer: Self-pay | Admitting: Internal Medicine

## 2013-07-10 ENCOUNTER — Ambulatory Visit (INDEPENDENT_AMBULATORY_CARE_PROVIDER_SITE_OTHER): Payer: Medicare HMO | Admitting: Internal Medicine

## 2013-07-10 VITALS — BP 122/68 | HR 69 | Temp 97.4°F | Ht 71.0 in | Wt 203.0 lb

## 2013-07-10 DIAGNOSIS — G5602 Carpal tunnel syndrome, left upper limb: Secondary | ICD-10-CM

## 2013-07-10 DIAGNOSIS — R21 Rash and other nonspecific skin eruption: Secondary | ICD-10-CM

## 2013-07-10 DIAGNOSIS — J8410 Pulmonary fibrosis, unspecified: Secondary | ICD-10-CM

## 2013-07-10 DIAGNOSIS — M25519 Pain in unspecified shoulder: Secondary | ICD-10-CM

## 2013-07-10 DIAGNOSIS — G8929 Other chronic pain: Secondary | ICD-10-CM

## 2013-07-10 DIAGNOSIS — G5600 Carpal tunnel syndrome, unspecified upper limb: Secondary | ICD-10-CM

## 2013-07-10 DIAGNOSIS — M25511 Pain in right shoulder: Secondary | ICD-10-CM

## 2013-07-10 DIAGNOSIS — M25551 Pain in right hip: Secondary | ICD-10-CM

## 2013-07-10 DIAGNOSIS — M7021 Olecranon bursitis, right elbow: Secondary | ICD-10-CM | POA: Insufficient documentation

## 2013-07-10 DIAGNOSIS — M7020 Olecranon bursitis, unspecified elbow: Secondary | ICD-10-CM

## 2013-07-10 DIAGNOSIS — M25559 Pain in unspecified hip: Secondary | ICD-10-CM

## 2013-07-10 HISTORY — DX: Olecranon bursitis, right elbow: M70.21

## 2013-07-10 HISTORY — DX: Pulmonary fibrosis, unspecified: J84.10

## 2013-07-10 HISTORY — DX: Carpal tunnel syndrome, left upper limb: G56.02

## 2013-07-10 HISTORY — DX: Other chronic pain: G89.29

## 2013-07-10 MED ORDER — TRIAMCINOLONE ACETONIDE 0.1 % EX CREA: 1.0000 "application " | TOPICAL_CREAM | Freq: Two times a day (BID) | CUTANEOUS | Status: DC

## 2013-07-10 NOTE — Patient Instructions (Addendum)
We will to get you a left wrist splint if in stock, otherwise these can be obtained at a local pharmacy , to wear at night only  You will be contacted regarding the referral for: hand surgeon  Please consider seeing Dr Matthew Bridges in our office for the right shoulder and right hip area pain, as his ultrasound in the office is more accurate at finding the cause of the pain  (you can make an appointment as you leave if you like at the scheduling desk)  Please continue all other medications as before, and refills have been done if requested. Please have the pharmacy call with any other refills you may need.  Please take all new medication as prescribed - the steroid cream  Please continue your efforts at being more active, low cholesterol diet, and weight control.  Please return in 6 months, or sooner if needed

## 2013-07-10 NOTE — Progress Notes (Signed)
Pre visit review using our clinic review tool, if applicable. No additional management support is needed unless otherwise documented below in the visit note. 

## 2013-07-10 NOTE — Progress Notes (Signed)
Subjective:    Patient ID: Matthew Bridges, male    DOB: [birth year], 80 y.o.   MRN: [id number]  HPI  Here with family, has severe HOH, Pt denies chest pain, increased sob or doe, wheezing, orthopnea, PND, increased LE swelling, palpitations, dizziness or syncope.  S/p recent left mid ant leg biopsy neg for cancer, but has dermatitis, ran out of cream per derm, now returning, itching.  Also has a painless new bubble like swelling to the right elbow.  Also has ongoing right shoulder pain, known DJD, has seen ortho several times with several PT treatments but not much improved, complains bitterly of each time $40 copay.  Also has recent 2 mo left hand numbness and and discomfort, with some radiation proximally, but no weakness.  No prior hx of same recent wt gain, or known low b12. ALso some ill defined right hip pain and points to right ischial area without tedner, sweling, rash. Past Medical History  Diagnosis Date  . Bronchiectasis   . Nephrolithiasis   . Pneumonia   . Hyperlipidemia   . Insomnia   . Alcohol abuse   . Asthmatic bronchitis   . Pleurisy   . GERD (gastroesophageal reflux disease)   . Diverticulosis     Colonoscopy 2007/Dr. Leone PayorGessner  . Hx of colonic polyp     tubular adenoma (05/16/2009), hyperplastic polyp  . H/O hiatal hernia   . Blood transfusion   . Anemia   . 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 Arthritis   . Anxiety   . Shortness of breath   . Coronary artery disease     sees Dr. Eden EmmsNishan, saw 1 month ago  . COPD (chronic obstructive pulmonary disease)     sees Dr/ Delford FieldWright, saw last Dec 2012  . S/P CABG (coronary artery bypass graft)   . Lumbar disc disease 10/02/2011  . Emotional lability 10/02/2011  . Chronic low back pain 10/02/2011  . BPH (benign prostatic hyperplasia) 10/02/2011  . B12 deficiency 01/01/2012  . Orthostatic hypotension 03/29/2012  . Pulmonary fibrosis 07/10/2013   Past Surgical History  Procedure Laterality Date  .  Tonsillectomy    . Cataract extraction      bilateral  . Kidney stone surgery      removal of kidney stone  . Cardiac catheterization      Friday 4, 2013  . Coronary artery bypass graft  03/31/2011    Procedure: CORONARY ARTERY BYPASS GRAFTING (CABG);  Surgeon: Alleen BorneBryan K Bartle, MD;  [facility];  Service: Open Heart Surgery;  Laterality: N/A;  Times three using the mammary and right leg greater saphenous vein.   . Colonoscopy      multiple    reports that he quit smoking about 57 years ago. His smoking use included Cigarettes. He has a 10 pack-year smoking history. He quit smokeless tobacco use about 65 years ago. His smokeless tobacco use included Snuff and Chew. He reports that he does not drink alcohol or use illicit drugs. family history includes Alcohol abuse in his father; Breast cancer in his sister; Colon cancer in his brother and father; Heart disease in his mother and other; Lung cancer in his brother. Allergies  Allergen Reactions  . Alprazolam Other (See Comments)    Confusion, weakness  . Codeine Other (See Comments)    Makes him feel dizzy, out of it  . Penicillins Hives  . Tramadol     Zombie state   Current Outpatient  Prescriptions on File Prior to Visit  Medication Sig Dispense Refill  . albuterol (PROAIR HFA) 108 (90 BASE) MCG/ACT inhaler Inhale 2 puffs into the lungs 4 (four) times daily as needed. For shortness of breath  1 Inhaler  6  . aspirin 81 MG tablet Take 81 mg by mouth daily.      . bisacodyl (DULCOLAX) 5 MG EC tablet Take 15 mg by mouth daily as needed.      . furosemide (LASIX) 20 MG tablet Take 1 tablet (20 mg total) by mouth daily as needed.  30 tablet  6  . LORazepam (ATIVAN) 0.5 MG tablet TAKE 1 TABLET BY MOUTH TWICE DAILY AS NEEDED FOR ANXIETY.  60 tablet  5  . metoprolol tartrate (LOPRESSOR) 25 MG tablet 1/2 tab twice per day  30 tablet  11  . omeprazole (PRILOSEC) 20 MG capsule Take 1 capsule (20 mg total) by mouth daily.  90 capsule  3  .  pravastatin (PRAVACHOL) 20 MG tablet Take 1 tablet (20 mg total) by mouth daily.  90 tablet  3   No current facility-administered medications on file prior to visit.    Review of Systems  Constitutional: Negative for unusual diaphoresis or other sweats  HENT: Negative for ringing in ear Eyes: Negative for double vision or worsening visual disturbance.  Respiratory: Negative for choking and stridor.   Gastrointestinal: Negative for vomiting or other signifcant bowel change Genitourinary: Negative for hematuria or decreased urine volume.  Musculoskeletal: Negative for other MSK pain or swelling Skin: Negative for color change and worsening wound.  Neurological: Negative for tremors and numbness other than noted  Psychiatric/Behavioral: Negative for decreased concentration or agitation other than above       Objective:   Physical Exam BP 122/68  Pulse 69  Temp(Src) 97.4 F (36.3 C) (Oral)  Ht 5\' 11"  (1.803 m)  Wt 203 lb (92.08 kg)  BMI 28.33 kg/m2  SpO2 96% VS noted,  Constitutional: Pt appears well-developed, well-nourished.  HENT: Head: NCAT.  Right Ear: External ear normal.  Left Ear: External ear normal.  Eyes: . Pupils are equal, round, and reactive to light. Conjunctivae and EOM are normal Neck: Normal range of motion. Neck supple.  Cardiovascular: Normal rate and regular rhythm.   Pulmonary/Chest: Effort normal and breath sounds normal.  Abd:  Soft, NT, ND, + BS Right elbow with 1-2+ bursa type swelling at tip of olecranon process, no erythema, NT, no drainage Right shoulder with marked pain on passive forward elev and abduction Neurological: Pt is alert. Not confused , motor grossly intact, left grip intact and normal sens to LT Skin: Skin is warm. Left mid pretibial with 2 cm area dry scaly erythema NT without swelling Psychiatric: Pt behavior is normal. No agitation.      Assessment & Plan:

## 2013-07-12 NOTE — Assessment & Plan Note (Signed)
Probable by hx, declines b12 check, for left wrist splint at night to start, refer hand surgury as well

## 2013-07-12 NOTE — Assessment & Plan Note (Signed)
Mild persistent, unclear if local or radicular pain,  to f/u any worsening symptoms or concerns

## 2013-07-12 NOTE — Assessment & Plan Note (Signed)
Left pretibial, for triam cr prn

## 2013-07-12 NOTE — Assessment & Plan Note (Signed)
Benign appearance, non inflamed, d/w pt natural hx, ok to follow with avoiding pressure to right elbow

## 2013-07-12 NOTE — Assessment & Plan Note (Signed)
D/w pt, I advised to see Dr smith/sport med, declines for now but will consider

## 2013-07-22 ENCOUNTER — Other Ambulatory Visit (INDEPENDENT_AMBULATORY_CARE_PROVIDER_SITE_OTHER): Payer: Medicare HMO

## 2013-07-22 ENCOUNTER — Ambulatory Visit (INDEPENDENT_AMBULATORY_CARE_PROVIDER_SITE_OTHER): Payer: Medicare HMO | Admitting: Family Medicine

## 2013-07-22 ENCOUNTER — Encounter: Payer: Self-pay | Admitting: Family Medicine

## 2013-07-22 VITALS — BP 130/72 | HR 67 | Wt 204.0 lb

## 2013-07-22 DIAGNOSIS — G8929 Other chronic pain: Secondary | ICD-10-CM

## 2013-07-22 DIAGNOSIS — M25551 Pain in right hip: Secondary | ICD-10-CM

## 2013-07-22 DIAGNOSIS — M25519 Pain in unspecified shoulder: Secondary | ICD-10-CM

## 2013-07-22 DIAGNOSIS — M25559 Pain in unspecified hip: Secondary | ICD-10-CM

## 2013-07-22 DIAGNOSIS — M19019 Primary osteoarthritis, unspecified shoulder: Secondary | ICD-10-CM

## 2013-07-22 DIAGNOSIS — M25511 Pain in right shoulder: Secondary | ICD-10-CM

## 2013-07-22 DIAGNOSIS — M12819 Other specific arthropathies, not elsewhere classified, unspecified shoulder: Secondary | ICD-10-CM

## 2013-07-22 HISTORY — DX: Other specific arthropathies, not elsewhere classified, unspecified shoulder: M12.819

## 2013-07-22 NOTE — Assessment & Plan Note (Signed)
Right hip flexoer strain, given HEP, ICing and discussed over-the-counter medications. Patient will come back again in 3 weeks for further evaluation. Continues have pain and elected to ultrasound of the area in question need for further imaging.

## 2013-07-22 NOTE — Progress Notes (Signed)
Matthew Bridges D.O.  [facility] [address] [place], [place] [address] Phone: [phone](336) [phone] Subjective:    I'm seeing this patient by the request  of:  Matthew BarreJames John, MD   CC: Shoulder pain and hip pain right sided   OZH:YQMVHQIONGHPI:Subjective Matthew RamsayHubert Bridges is a 80 y.o. male coming in with complaint of shoulder pain. Patient is does with his shoulder he has had increasing pain mostly on the right shoulder. Patient states it hurts more on the anterior in the posterior aspect of the shoulder. Patient states that it hurts with certain movements. Patient has noticed some mild weakness with the and has not been able to move it quite as much as his contralateral side. Patient does not remember any true injury but has not been as active since patient has had heart trouble over the course of the last 2 years.  Patient states it is more of a dull aching pain and notices that he is unable to lift above his head on a regular basis. Patient states that the pain can wake him up at night as well. Denies any association with food or any trouble with chest pain that is associated with a. Patient describes as more of a dull aching sensation. Severity is 8/10  Patient also states that he has right hip pain and points come down the anterior aspect of the hip. Patient denies groin pain. States it is worse with any strength lift his leg even in the sitting position. Patient does not remember any true injury. Denies any radiation down the leg in denies any associated back pain with this problem. Patient does have a past medical history significant for lumbar disc disease but states that this feels like a different pain. Has only occurred over the course last 2 weeks but the severity as 7 out 10.    Past medical history, social, surgical and family history all reviewed in electronic medical record.   Review of Systems: No headache, visual changes, nausea, vomiting, diarrhea, constipation, dizziness, abdominal pain, skin  rash, fevers, chills, night sweats, weight loss, swollen lymph nodes, body aches, joint swelling, muscle aches, chest pain, shortness of breath, mood changes.   Objective Blood pressure 130/72, pulse 67, weight 204 lb (92.534 kg), SpO2 96.00%.  General: No apparent distress alert and oriented x3 mood and affect normal, dressed appropriately. frail HEENT: Pupils equal, extraocular movements intact  Respiratory: Patient's speak in full sentences and does not appear short of breath  Cardiovascular: No lower extremity edema, non tender, no erythema  Skin: Warm dry intact with no signs of infection or rash on extremities or on axial skeleton.  Abdomen: Soft nontender  Neuro: Cranial nerves II through XII are intact, neurovascularly intact in all extremities with 2+ DTRs and 2+ pulses.  Lymph: No lymphadenopathy of posterior or anterior cervical chain or axillae bilaterally.  Gait normal with good balance and coordination.  MSK:  Non tender with full range of motion and good stability and symmetric strength and tone of , elbows, wrist,  knee and ankles bilaterally.  Shoulder: Right Inspection reveals no abnormalities, atrophy or asymmetry. Palpation is normal with no tenderness over AC joint or bicipital groove. ROM is passively.  Rotator cuff strength 4/5   signs of impingement with positive Neer and Hawkin's tests, empty can sign. Speeds and Yergason's tests normal. No labral pathology noted with negative Obrien's, negative clunk and good stability. Normal scapular function observed. No painful arc and no drop arm sign. No apprehension sign Contralateral  shoulder unremarkable.    Hip: right ROM IR: 35 Deg, ER: 45 Deg, Flexion: 100 Deg, Extension: 100 Deg, Abduction: 45 Deg, Adduction: 45 Deg Strength IR: 4/5, ER: 5/5, Flexion: 5/5, Extension: 5/5, Abduction: 5/5, Adduction: 5/5 Pelvic alignment unremarkable to inspection and palpation. Standing hip rotation and gait without  trendelenburg sign / unsteadiness. Greater trochanter without tenderness to palpation. No tenderness over piriformis and greater trochanter. Tender over hip flexor, no  No pain with FABER or FADIR. No SI joint tenderness and normal minimal SI movement.  Procedure: Real-time Ultrasound Guided Injection of right glenohumeral joint Device: GE Logiq E  Ultrasound guided injection is preferred based studies that show increased duration, increased effect, greater accuracy, decreased procedural pain, increased response rate with ultrasound guided versus blind injection.  Verbal informed consent obtained.  Time-out conducted.  Noted no overlying erythema, induration, or other signs of local infection.  Skin prepped in a sterile fashion.  Local anesthesia: Topical Ethyl chloride.  With sterile technique and under real time ultrasound guidance:  Joint visualized.  23g 1  inch needle inserted posterior approach. Pictures taken for needle placement. Patient did have injection of 2 cc of 1% lidocaine, 2 cc of 0.5% Marcaine, and 1.0 cc of Kenalog 40 mg/dL. Completed without difficulty  Pain immediately resolved suggesting accurate placement of the medication.  Advised to call if fevers/chills, erythema, induration, drainage, or persistent bleeding.  Images permanently stored and available for review in the ultrasound unit.  Impression: Technically successful ultrasound guided injection.    Impression and Recommendations:     This case required medical decision making of moderate complexity.

## 2013-07-22 NOTE — Assessment & Plan Note (Signed)
Steroid injection done today. Patient did have good resolution of pain. Patient home exercise program as well as icing. We discussed over-the-counter medications he can be beneficial as well. Patient will follow up again in 3-4 weeks for further evaluation.

## 2013-07-22 NOTE — Patient Instructions (Signed)
Very nice to meet you both Exercises 3 times a week Vitamin D 2000 IU daily Fish oil 2 grams daily.  Tumeric 500mg  twice daily.  Capsaicin topically up to four times a day may also help with pain. Protein drink 45 minutes before lunch and dinner as well as at bedtime.  It's important that you continue to stay active. Controlling your weight is important.  Water aerobics and cycling with low resistance are the best two types of exercise for arthritis. Come back and see me in 3-4 weeks.

## 2013-08-19 ENCOUNTER — Ambulatory Visit: Payer: Medicare HMO | Admitting: Family Medicine

## 2013-08-28 ENCOUNTER — Other Ambulatory Visit: Payer: Self-pay | Admitting: Internal Medicine

## 2013-08-28 ENCOUNTER — Other Ambulatory Visit: Payer: Self-pay | Admitting: Cardiovascular Disease

## 2013-08-28 NOTE — Telephone Encounter (Signed)
Done hardcopy to robin  

## 2013-08-29 NOTE — Telephone Encounter (Signed)
Faxed hardcopy to [facility] [place] 

## 2013-09-15 ENCOUNTER — Other Ambulatory Visit: Payer: Self-pay | Admitting: Dermatology

## 2013-10-22 ENCOUNTER — Ambulatory Visit (INDEPENDENT_AMBULATORY_CARE_PROVIDER_SITE_OTHER)
Admission: RE | Admit: 2013-10-22 | Discharge: 2013-10-22 | Disposition: A | Discharge status: Home / Self Care | Payer: Medicare HMO | Source: Ambulatory Visit | Attending: Family Medicine | Admitting: Family Medicine

## 2013-10-22 ENCOUNTER — Other Ambulatory Visit (INDEPENDENT_AMBULATORY_CARE_PROVIDER_SITE_OTHER): Payer: Medicare HMO

## 2013-10-22 ENCOUNTER — Ambulatory Visit (INDEPENDENT_AMBULATORY_CARE_PROVIDER_SITE_OTHER): Payer: Medicare HMO | Admitting: Family Medicine

## 2013-10-22 VITALS — BP 128/70 | HR 64 | Ht 71.0 in | Wt 204.0 lb

## 2013-10-22 DIAGNOSIS — M25559 Pain in unspecified hip: Secondary | ICD-10-CM

## 2013-10-22 DIAGNOSIS — M79609 Pain in unspecified limb: Secondary | ICD-10-CM

## 2013-10-22 DIAGNOSIS — M519 Unspecified thoracic, thoracolumbar and lumbosacral intervertebral disc disorder: Secondary | ICD-10-CM

## 2013-10-22 DIAGNOSIS — M12811 Other specific arthropathies, not elsewhere classified, right shoulder: Secondary | ICD-10-CM

## 2013-10-22 DIAGNOSIS — M47817 Spondylosis without myelopathy or radiculopathy, lumbosacral region: Secondary | ICD-10-CM

## 2013-10-22 DIAGNOSIS — M19019 Primary osteoarthritis, unspecified shoulder: Secondary | ICD-10-CM

## 2013-10-22 DIAGNOSIS — M79604 Pain in right leg: Secondary | ICD-10-CM

## 2013-10-22 DIAGNOSIS — G8929 Other chronic pain: Secondary | ICD-10-CM

## 2013-10-22 DIAGNOSIS — M25551 Pain in right hip: Secondary | ICD-10-CM

## 2013-10-22 DIAGNOSIS — M47818 Spondylosis without myelopathy or radiculopathy, sacral and sacrococcygeal region: Secondary | ICD-10-CM | POA: Insufficient documentation

## 2013-10-22 NOTE — Patient Instructions (Signed)
Good to see you Ice your friend.  Xray downstairs today.  Try the exercises 3 times a week.  Come back in 3 weeks.

## 2013-10-22 NOTE — Assessment & Plan Note (Signed)
Severe osteopenia multiple compression changes over time, will need to consider radicular symptoms.  Patient though declined trial of nuerontin.

## 2013-10-22 NOTE — Assessment & Plan Note (Signed)
Pain resolved after injection.  Continue conservative therapy.

## 2013-10-22 NOTE — Assessment & Plan Note (Signed)
Patient had more pain over right SI joint today.   Injection given HEP given Discussed icing Patient declined all oral and topical medications today  Xrays ordered and reviewed by me.  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  If no improvement consider hip intra-articular injection.

## 2013-10-22 NOTE — Progress Notes (Signed)
[facility] [address] [place], [place] [address] Phone: [phone] Subjective:     CC: Shoulder pain and hip pain right sided   BJY:NWGNFAOZHY Matthew Bridges is a 80 y.o. male coming in with complaint of shoulder pain. Patient was seen previously and did have rotator cuff arthropathy was given an injection. Patient states that this didn't completely resolve his pain.  Patient does complain more posterior hip pain at this time. Patient states that it hurts more when he changes position or if he walks for long amount of time. Patient states he can get a severe sharp pain that makes someone fall to ground. Patient does have known osteoporosis. Patient denies any severe back pain and states that it seems to be more localized onto the right side. States that he can radiate down the posterior aspect of his leg all the way to his calf. Denies any significant weakness. Patient is ambulating with a cane. States that this is different than the hip flexor strain that I seen him for previously.    Past medical history, social, surgical and family history all reviewed in electronic medical record.   Review of Systems: No headache, visual changes, nausea, vomiting, diarrhea, constipation, dizziness, abdominal pain, skin rash, fevers, chills, night sweats, weight loss, swollen lymph nodes, body aches, joint swelling, muscle aches, chest pain, shortness of breath, mood changes.   Objective Blood pressure 128/70, pulse 64, height 5\' 11"  (1.803 m), weight 204 lb (92.534 kg), SpO2 97.00%.  General: No apparent distress alert and oriented x3 mood and affect normal, dressed appropriately. frail HEENT: Pupils equal, extraocular movements intact  Respiratory: Patient's speak in full sentences and does not appear short of breath  Cardiovascular: No lower extremity edema, non tender, no erythema  Skin: Warm dry intact with no signs of infection or rash on extremities or on axial  skeleton.  Abdomen: Soft nontender  Neuro: Cranial nerves II through XII are intact, neurovascularly intact in all extremities with 2+ DTRs and 2+ pulses.  Lymph: No lymphadenopathy of posterior or anterior cervical chain or axillae bilaterally.  Gait normal with good balance and coordination.  MSK:  Non tender with full range of motion and good stability and symmetric strength and tone of , elbows, wrist,  knee and ankles bilaterally.  Shoulder: Right Inspection reveals no abnormalities, atrophy or asymmetry. Palpation is normal with no tenderness over AC joint or bicipital groove. ROM is passively.  Rotator cuff strength 4/5   signs of impingement with positive Neer and Hawkin's tests, empty can sign but better than previous exam. . Speeds and Yergason's tests normal. No labral pathology noted with negative Obrien's, negative clunk and good stability. Normal scapular function observed. No painful arc and no drop arm sign. No apprehension sign Contralateral shoulder unremarkable.    Hip: right ROM IR: 35 Deg, ER: 45 Deg, Flexion: 100 Deg, Extension: 100 Deg, Abduction: 45 Deg, Adduction: 45 Deg Strength IR: 4/5, ER: 5/5, Flexion: 5/5, Extension: 5/5, Abduction: 5/5, Adduction: 5/5 Pelvic alignment unremarkable to inspection and palpation. Standing hip rotation and gait without trendelenburg sign / unsteadiness. Greater trochanter without tenderness to palpation. No tenderness over piriformis and greater trochanter. Tender over hip flexor, no  No pain with FABER or FADIR. Severe SI joint pain to palpation.  . X-rays were ordered reviewed and interpreted by me today. Lumbar x-ray shows diffuse osteopenia and moderate to severe degenerative changes at multiple levels. Patient's hip exam shows the patient has moderate degenerative  changes. Severe arthritis of the SI joint  After verbal consent patient was prepped with alcohol swabs and with a 21-gauge 2 inch heel was injected with 1 cc  of 0.5% Marcaine and 1 cc of Kenalog 40 mg/dL under ultrasound guidance into the right SI joint. Patient did have good resolution pain. No blood loss. Post injection instructions given    Impression and Recommendations:     This case required medical decision making of moderate complexity.

## 2013-10-23 ENCOUNTER — Encounter: Payer: Self-pay | Admitting: Family Medicine

## 2013-10-23 NOTE — Assessment & Plan Note (Signed)
Patient was given an injection today as well as a home exercise program. Patient declined formal physical therapy. We discussed the possibility of oral medications or topical medications which patient declined. Patient will continue with the conservative therapy and come back and see me again in 3-4 weeks for further evaluation. If he continues to have pain we may need to consider an intra-articular injection of the hip versus consideration of an epidural steroid injection in the back for radicular symptoms. We'll discuss can treat based on findings.  Spent greater than 25 minutes with patient face-to-face and had greater than 50% of counseling including as described above in assessment and plan.

## 2013-10-24 ENCOUNTER — Ambulatory Visit: Payer: Medicare HMO | Admitting: Critical Care Medicine

## 2013-11-19 ENCOUNTER — Encounter: Payer: Self-pay | Admitting: Cardiovascular Disease

## 2013-11-19 ENCOUNTER — Ambulatory Visit (INDEPENDENT_AMBULATORY_CARE_PROVIDER_SITE_OTHER): Payer: Medicare HMO | Admitting: Cardiovascular Disease

## 2013-11-19 ENCOUNTER — Ambulatory Visit (HOSPITAL_COMMUNITY): Payer: Medicare HMO | Attending: Internal Medicine | Admitting: Cardiology

## 2013-11-19 VITALS — BP 158/72 | HR 59 | Ht 72.0 in | Wt 201.8 lb

## 2013-11-19 DIAGNOSIS — I10 Essential (primary) hypertension: Secondary | ICD-10-CM | POA: Diagnosis not present

## 2013-11-19 DIAGNOSIS — Z87891 Personal history of nicotine dependence: Secondary | ICD-10-CM | POA: Insufficient documentation

## 2013-11-19 DIAGNOSIS — Z8673 Personal history of transient ischemic attack (TIA), and cerebral infarction without residual deficits: Secondary | ICD-10-CM | POA: Insufficient documentation

## 2013-11-19 DIAGNOSIS — R42 Dizziness and giddiness: Secondary | ICD-10-CM

## 2013-11-19 DIAGNOSIS — E785 Hyperlipidemia, unspecified: Secondary | ICD-10-CM | POA: Insufficient documentation

## 2013-11-19 DIAGNOSIS — I658 Occlusion and stenosis of other precerebral arteries: Secondary | ICD-10-CM | POA: Diagnosis not present

## 2013-11-19 DIAGNOSIS — I779 Disorder of arteries and arterioles, unspecified: Secondary | ICD-10-CM

## 2013-11-19 DIAGNOSIS — R209 Unspecified disturbances of skin sensation: Secondary | ICD-10-CM | POA: Insufficient documentation

## 2013-11-19 DIAGNOSIS — J4489 Other specified chronic obstructive pulmonary disease: Secondary | ICD-10-CM | POA: Insufficient documentation

## 2013-11-19 DIAGNOSIS — J449 Chronic obstructive pulmonary disease, unspecified: Secondary | ICD-10-CM | POA: Diagnosis not present

## 2013-11-19 DIAGNOSIS — I2510 Atherosclerotic heart disease of native coronary artery without angina pectoris: Secondary | ICD-10-CM | POA: Insufficient documentation

## 2013-11-19 DIAGNOSIS — Z951 Presence of aortocoronary bypass graft: Secondary | ICD-10-CM | POA: Diagnosis not present

## 2013-11-19 DIAGNOSIS — I6523 Occlusion and stenosis of bilateral carotid arteries: Secondary | ICD-10-CM

## 2013-11-19 DIAGNOSIS — I6529 Occlusion and stenosis of unspecified carotid artery: Secondary | ICD-10-CM

## 2013-11-19 DIAGNOSIS — I4891 Unspecified atrial fibrillation: Secondary | ICD-10-CM

## 2013-11-19 DIAGNOSIS — I739 Peripheral vascular disease, unspecified: Secondary | ICD-10-CM

## 2013-11-19 DIAGNOSIS — I480 Paroxysmal atrial fibrillation: Secondary | ICD-10-CM

## 2013-11-19 NOTE — Assessment & Plan Note (Signed)
Cholesterol is at goal.  Continue current dose of statin and diet Rx.  No myalgias or side effects.  F/U  LFT's in 6 months. No results found for this basename: LDLCALC             

## 2013-11-19 NOTE — Assessment & Plan Note (Signed)
Stable with no angina and good activity level.  Continue medical Rx  

## 2013-11-19 NOTE — Patient Instructions (Signed)
Your physician wants you to follow-up in:   6 MONTHS WITH  DR NISHAN  CAROTID  SAME  DAY   You will receive a reminder letter in the mail two months in advance. If you don't receive a letter, please call our office to schedule the follow-up appointment. Your physician recommends that you continue on your current medications as directed. Please refer to the Current Medication list given to you today.  

## 2013-11-19 NOTE — Progress Notes (Signed)
Patient ID: Matthew Bridges, male   DOB: [birth year], 80 y.o.   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 Had malaise and low HR. Lopresser decreased. Has a tremor that seems to be worse since CABG. Needs F/U with neuro. No SSCP. Wants to drive and I think 8 weeks next week he will be fine to drive. LE edema resolved. Sternum well healed. Voice still a little hoarse.  Known carotid disease. Duplex done today  reviewed  60-79% RICA and 60-79% LICA Systolic velocity on left is 3.1cm  His dizzyness is related to lorazapam that he has taken for 15 years. He saw Dr Graciela Husbands who did not think tilt table testing needed. He has a very labile personality with outbursts of agression and I am not sure that his psychiatric condition has been fully evaluated.  His swimmy headed feeling is an inner ear issue  Echo 12/12/12 EF 55-60% no vlave disease       ROS: Denies fever, malais, weight loss, blurry vision, decreased visual acuity, cough, sputum, SOB, hemoptysis, pleuritic pain, palpitaitons, heartburn, abdominal pain, melena, lower extremity edema, claudication, or rash.  All other systems reviewed and negative  General: Affect appropriate Healthy:  appears stated age HEENT: normal Neck supple with no adenopathy JVP normal no bruits no thyromegaly Lungs clear with no wheezing and good diaphragmatic motion Heart:  S1/S2 no murmur, no rub, gallop or click PMI normal Abdomen: benighn, BS positve, no tenderness, no AAA no bruit.  No HSM or HJR Distal pulses intact with no bruits No edema Neuro non-focal Skin warm and dry No muscular weakness   Current Outpatient Prescriptions  Medication Sig Dispense Refill  . aspirin 81 MG tablet Take 81 mg by mouth daily.      . bisacodyl (DULCOLAX) 5 MG EC tablet Take 15 mg by mouth daily as needed.      . furosemide (LASIX) 20 MG tablet Take 1 tablet (20 mg total) by mouth daily as needed.  30 tablet  6  . LORazepam (ATIVAN) 0.5 MG tablet  TAKE ONE TABLET BY MOUTH TWICE DAILY AS NEEDED FOR ANXIETY  60 tablet  5  . metoprolol tartrate (LOPRESSOR) 25 MG tablet TAKE ONE-HALF TABLET BY MOUTH TWICE DAILY  30 tablet  2  . omeprazole (PRILOSEC) 20 MG capsule Take 1 capsule (20 mg total) by mouth daily.  90 capsule  3  . pravastatin (PRAVACHOL) 20 MG tablet Take 1 tablet (20 mg total) by mouth daily.  90 tablet  3   No current facility-administered medications for this visit.    Allergies  Alprazolam; Codeine; Penicillins; and Tramadol  Electrocardiogram:  SR rate 59  ? Old IMI nonspecific lateral T wave changes   Assessment and Plan

## 2013-11-19 NOTE — Assessment & Plan Note (Signed)
Left/Right  ICA 60-79% stenosis.  No TIA symptoms.  Continue antiplatelet Rx and F/U carotid duplex in 6 months

## 2013-11-19 NOTE — Progress Notes (Signed)
Carotid duplex performed 

## 2013-11-19 NOTE — Assessment & Plan Note (Signed)
maint nsr with no palpitations

## 2013-11-20 ENCOUNTER — Encounter: Payer: Self-pay | Admitting: Cardiovascular Disease

## 2013-11-21 ENCOUNTER — Ambulatory Visit: Payer: Medicare HMO | Admitting: Critical Care Medicine

## 2013-12-04 ENCOUNTER — Other Ambulatory Visit: Payer: Self-pay | Admitting: Cardiovascular Disease

## 2013-12-11 ENCOUNTER — Telehealth: Payer: Self-pay | Admitting: Family Medicine

## 2013-12-11 NOTE — Telephone Encounter (Signed)
Pt called stated he had x-ray done around 10/22/13 and he never heard anything result from our office. Please call pt

## 2013-12-12 NOTE — Telephone Encounter (Signed)
Discussed with pt

## 2013-12-12 NOTE — Telephone Encounter (Signed)
Please call patient to moderate arthritis of the back and hip otherwise fairly unremarkable.

## 2013-12-17 ENCOUNTER — Telehealth: Payer: Self-pay | Admitting: Internal Medicine

## 2013-12-17 NOTE — Telephone Encounter (Signed)
Left message for patient to call back  

## 2013-12-17 NOTE — Telephone Encounter (Signed)
Patient c/o constipation he reports that the miralax and dulcolax stopped working.  Per KUB last year for the same issue he was started on Miralax purge.  He is instructed to take 4 dulcolax and follow with 4-6 glasses of Miralax.  One he has had a large BM he is to start Miralax 1 capful daily with dulcolax every 2nd day if needed and if needed glycerine suppository.  I have scheduled a follow up for 02/17/14 at 11:30.  His wife will call back if the purge and the daily Miralax/dulcolax plan does not help to be worked in for an earlier appt.

## 2013-12-30 ENCOUNTER — Ambulatory Visit (INDEPENDENT_AMBULATORY_CARE_PROVIDER_SITE_OTHER): Payer: Medicare HMO

## 2013-12-30 DIAGNOSIS — Z23 Encounter for immunization: Secondary | ICD-10-CM

## 2014-01-01 ENCOUNTER — Other Ambulatory Visit: Payer: Self-pay

## 2014-01-01 MED ORDER — METOPROLOL TARTRATE 25 MG PO TABS: 25.0000 mg | ORAL_TABLET | Freq: Two times a day (BID) | ORAL | Status: DC

## 2014-01-02 ENCOUNTER — Ambulatory Visit (INDEPENDENT_AMBULATORY_CARE_PROVIDER_SITE_OTHER): Payer: Medicare HMO | Admitting: Family Medicine

## 2014-01-02 ENCOUNTER — Ambulatory Visit: Payer: Medicare HMO

## 2014-01-02 ENCOUNTER — Ambulatory Visit: Payer: Medicare HMO | Admitting: Internal Medicine

## 2014-01-02 ENCOUNTER — Encounter: Payer: Self-pay | Admitting: Family Medicine

## 2014-01-02 VITALS — BP 108/64 | HR 63 | Ht 72.0 in | Wt 206.0 lb

## 2014-01-02 DIAGNOSIS — M545 Low back pain, unspecified: Secondary | ICD-10-CM

## 2014-01-02 DIAGNOSIS — G8929 Other chronic pain: Secondary | ICD-10-CM

## 2014-01-02 DIAGNOSIS — M5416 Radiculopathy, lumbar region: Secondary | ICD-10-CM

## 2014-01-02 MED ORDER — METHYLPREDNISOLONE ACETATE 80 MG/ML IJ SUSP
80.0000 mg | Freq: Once | INTRAMUSCULAR | Status: AC
  Administered 2014-01-02: 80 mg via INTRAMUSCULAR

## 2014-01-02 MED ORDER — KETOROLAC TROMETHAMINE 60 MG/2ML IM SOLN
60.0000 mg | Freq: Once | INTRAMUSCULAR | Status: AC
  Administered 2014-01-02: 60 mg via INTRAMUSCULAR

## 2014-01-02 NOTE — Patient Instructions (Signed)
Good to see you We will try epidural in your back for the pain down the leg. They may make me do an CT or MRI first before we do injection.  Ice is your friend.  See me again in 1 weeks after the injection and we will see how you are doing.

## 2014-01-02 NOTE — Assessment & Plan Note (Signed)
Patient does have degenerative changes of the low back and I do think the patient does have a lumbar radiculopathy that is causing his discomfort more than the moderate osteophytic changes of the hip. Patient's at this point will be scheduled for an MRI of the lumbar spine. Depending on the findings if we do see I nerve impingement we may need to consider an epidural steroid injection. We'll discuss further after MRI results.  If patient does not make any improvement we may need to consider an intra-articular hip injection as well.

## 2014-01-02 NOTE — Progress Notes (Signed)
  Matthew Bridges D.O. [facility] [address] [place], [place] [address] Phone: [phone](336) [phone] Subjective:     CC:  hip pain right sided   GMW:NUUVOZDGUYHPI:Subjective Matthew RamsayHubert Bridges is a 80 y.o. male patient is coming back for followup of his pain.. She states most of his pain seems to be in the low back as well as going down the right leg. At last visit patient was given a sacroiliac joint injection under ultrasound guidance and states he'll he had some mild relief and never was completely pain-free. Patient states that the pain is back and if anything it is worse than usual. Patient previously did have x-rays that show that patient does have moderate osteophytic changes bilaterally right greater than left. In addition that patient's lumbar spine x-ray showed patient does have osteopenia chronic degenerative changes at multiple levels. Patient states most of his pain seems to be mostly in the back at this time. Still having mostly pain on the lateral aspect of his hip but doesn't state that he is having as much on the inside part of his hip.    Past medical history, social, surgical and family history all reviewed in electronic medical record.   Review of Systems: No headache, visual changes, nausea, vomiting, diarrhea, constipation, dizziness, abdominal pain, skin rash, fevers, chills, night sweats, weight loss, swollen lymph nodes, body aches, joint swelling, muscle aches, chest pain, shortness of breath, mood changes.   Objective Blood pressure 108/64, pulse 63, height 6' (1.829 m), weight 206 lb (93.441 kg), SpO2 96.00%.  General: No apparent distress alert and oriented x3 mood and affect normal, dressed appropriately. frail HEENT: Pupils equal, extraocular movements intact  Respiratory: Patient's speak in full sentences and does not appear short of breath  Cardiovascular: No lower extremity edema, non tender, no erythema  Skin: Warm dry intact with no signs of infection or rash on extremities  or on axial skeleton.  Abdomen: Soft nontender  Neuro: Cranial nerves II through XII are intact, neurovascularly intact in all extremities with 2+ DTRs and 2+ pulses.  Lymph: No lymphadenopathy of posterior or anterior cervical chain or axillae bilaterally.  Gait normal with good balance and coordination.  MSK:  Non tender with full range of motion and good stability and symmetric strength and tone of , elbows, wrist,  knee and ankles bilaterally.    Hip: right ROM IR: 25 Deg, with mild discomfort ER: 45 Deg, Flexion: 100 Deg, Extension: 100 Deg, Abduction: 45 Deg, Adduction: 45 Deg Strength IR: 4/5, ER: 5/5, Flexion: 5/5, Extension: 5/5, Abduction: 5/5, Adduction: 5/5 Pelvic alignment unremarkable to inspection and palpation. Standing hip rotation and gait without trendelenburg sign / unsteadiness. Greater trochanter without tenderness to palpation. No tenderness over piriformis and greater trochanter. Tender over hip flexor, Mild pain with Pearlean BrownieFaber and Fadir Severe SI joint pain to palpation.      Impression and Recommendations:     This case required medical decision making of moderate complexity. Spent greater than 25 minutes with patient face-to-face and had greater than 50% of counseling including as described above in assessment and plan.

## 2014-01-09 ENCOUNTER — Ambulatory Visit: Payer: Medicare HMO | Admitting: Internal Medicine

## 2014-01-09 ENCOUNTER — Telehealth: Payer: Self-pay | Admitting: Family Medicine

## 2014-01-09 NOTE — Telephone Encounter (Signed)
Patient wife stated that no has call to set up appointment  For husband MRI. Please advise

## 2014-01-09 NOTE — Telephone Encounter (Signed)
Spoke to pt's wife & told her that we have sent the infoto [facility] & we're just waiting for them to schedule the appointment. I gave her [facility] phone # so she can trying calling them.

## 2014-01-18 ENCOUNTER — Ambulatory Visit
Admission: RE | Admit: 2014-01-18 | Discharge: 2014-01-18 | Disposition: A | Discharge status: Home / Self Care | Payer: Medicare HMO | Source: Ambulatory Visit | Attending: Family Medicine | Admitting: Family Medicine

## 2014-01-18 DIAGNOSIS — M5416 Radiculopathy, lumbar region: Secondary | ICD-10-CM

## 2014-01-20 ENCOUNTER — Telehealth: Payer: Self-pay | Admitting: Cardiovascular Disease

## 2014-01-20 NOTE — Telephone Encounter (Signed)
PT'S WIFE  AWARE BYPASS  SURGERY  WAS  03-31-11  WITH DR Laneta SimmersBARTLE .Zack Seal/CY

## 2014-01-20 NOTE — Telephone Encounter (Signed)
New message      For insurance policy purposes------need to know exact bypass date---feb 2012?  Can you look in his file and tell them?

## 2014-01-26 ENCOUNTER — Ambulatory Visit (INDEPENDENT_AMBULATORY_CARE_PROVIDER_SITE_OTHER): Payer: Medicare HMO | Admitting: Family Medicine

## 2014-01-26 ENCOUNTER — Other Ambulatory Visit (INDEPENDENT_AMBULATORY_CARE_PROVIDER_SITE_OTHER): Payer: Medicare HMO

## 2014-01-26 ENCOUNTER — Encounter: Payer: Self-pay | Admitting: Family Medicine

## 2014-01-26 VITALS — BP 102/60 | HR 67 | Ht 72.0 in | Wt 208.0 lb

## 2014-01-26 DIAGNOSIS — M25551 Pain in right hip: Secondary | ICD-10-CM

## 2014-01-26 DIAGNOSIS — M1611 Unilateral primary osteoarthritis, right hip: Secondary | ICD-10-CM | POA: Insufficient documentation

## 2014-01-26 DIAGNOSIS — M1990 Unspecified osteoarthritis, unspecified site: Secondary | ICD-10-CM

## 2014-01-26 HISTORY — DX: Unilateral primary osteoarthritis, right hip: M16.11

## 2014-01-26 NOTE — Progress Notes (Signed)
Tawana ScaleZach Eamon Bridges D.O. [facility] [address] [place], [place] [address] Phone: [phone] Subjective:     CC:  hip pain right sided   OZH:YQMVHQIONGHPI:Subjective Matthew RamsayHubert Huneke is a 80 y.o. male patient is coming back for followup of his pain.. Patient's pain seemed to be right-sided but there is also potential for radicular symptoms from his back. Patient has had chronic back pain previously. Further imaging was done and patient did have an MRI of his lumbar spine.patient's MRI showed mild lumbar degenerative changes at multiple levels but no significant progression from 2008 and no neuronal impingement. Patient states he continues to have the same pain. Most of it seems to be in the back region and mostly localized around that sacroiliac joint. Patient does have known arthritis in the area but has not responded to injections previously. Patient denies any significant radiation down the leg at this time. Patient states though that from the pain he has to stop walking even approximately a quarter of a block now.  Hip x-rays show moderate osteophytic changes.      Past medical history, social, surgical and family history all reviewed in electronic medical record.   Review of Systems: No headache, visual changes, nausea, vomiting, diarrhea, constipation, dizziness, abdominal pain, skin rash, fevers, chills, night sweats, weight loss, swollen lymph nodes, body aches, joint swelling, muscle aches, chest pain, shortness of breath, mood changes.   Objective Blood pressure 102/60, pulse 67, height 6' (1.829 m), weight 208 lb (94.348 kg), SpO2 96 %.  General: No apparent distress alert and oriented x3 mood and affect normal, dressed appropriately. frail HEENT: Pupils equal, extraocular movements intact  Respiratory: Patient's speak in full sentences and does not appear short of breath  Cardiovascular: No lower extremity edema, non tender, no erythema  Skin: Warm dry intact with no signs of  infection or rash on extremities or on axial skeleton.  Abdomen: Soft nontender  Neuro: Cranial nerves II through XII are intact, neurovascularly intact in all extremities with 2+ DTRs and 2+ pulses.  Lymph: No lymphadenopathy of posterior or anterior cervical chain or axillae bilaterally.  Gait normal with good balance and coordination.  MSK:  Non tender with full range of motion and good stability and symmetric strength and tone of , elbows, wrist,  knee and ankles bilaterally.    Hip: right ROM IR: 25 Deg, with mild discomfort ER: 45 Deg, Flexion: 100 Deg, Extension: 100 Deg, Abduction: 45 Deg, Adduction: 45 Deg Strength IR: 4/5, ER: 5/5, Flexion: 5/5, Extension: 5/5, Abduction: 5/5, Adduction: 5/5 Pelvic alignment unremarkable to inspection and palpation. Standing hip rotation and gait without trendelenburg sign / unsteadiness. Greater trochanter without tenderness to palpation. No tenderness over piriformis and greater trochanter. Tender over hip flexor, Mild pain with Pearlean BrownieFaber and Fadir Severe SI joint pain to palpation.   Procedure: Real-time Ultrasound Guided Injection of right hip Device: GE Logiq E  Ultrasound guided injection is preferred based studies that show increased duration, increased effect, greater accuracy, decreased procedural pain, increased response rate with ultrasound guided versus blind injection.  Verbal informed consent obtained.  Time-out conducted.  Noted no overlying erythema, induration, or other signs of local infection.  Skin prepped in a sterile fashion.  Local anesthesia: Topical Ethyl chloride.  With sterile technique and under real time ultrasound guidance:  Anterior capsule visualized, needle visualized going to the head neck junction at the anterior capsule. Pictures taken. Patient did have injection of 3 cc of 1% lidocaine, 3 cc of  0.5% Marcaine, and 1 cc of Kenalog 40 mg/dL. Completed without difficulty  Pain immediately resolved suggesting  accurate placement of the medication.  Advised to call if fevers/chills, erythema, induration, drainage, or persistent bleeding.  Images permanently stored and available for review in the ultrasound unit.  Impression: Technically successful ultrasound guided injection.     Impression and Recommendations:     This case required medical decision making of moderate complexity. Spent greater than 25 minutes with patient face-to-face and had greater than 50% of counseling including as described above in assessment and plan.

## 2014-01-26 NOTE — Patient Instructions (Signed)
Good to see you Gustavus Bryantce is your friend New exercises 3 times a week.  Continue the vitamins  We can repeat injection every 3 months.  See m ewhen you need me.

## 2014-01-26 NOTE — Assessment & Plan Note (Signed)
Patient does have right hip arthritis. Patient tolerated the injection very well with good resolution of pain as well as patient was able to angulate better. We are fairly consistent that this is likely secondary to his hip pathology more in the lumbar pathology with this result. This was given new exercises we discussed continuing the over-the-counter medicines and patient will follow-up in 3-4 weeks for further evaluation and treatment. Declined formal physical therapy.  Spent greater than 25 minutes with patient face-to-face and had greater than 50% of counseling including as described above in assessment and plan.

## 2014-01-27 ENCOUNTER — Telehealth: Payer: Self-pay | Admitting: Internal Medicine

## 2014-01-27 NOTE — Telephone Encounter (Signed)
Patient is calling to let Dr Jonny RuizJohn know he is doing ok.

## 2014-02-17 ENCOUNTER — Ambulatory Visit (INDEPENDENT_AMBULATORY_CARE_PROVIDER_SITE_OTHER): Payer: Medicare HMO | Admitting: Internal Medicine

## 2014-02-17 ENCOUNTER — Encounter: Payer: Self-pay | Admitting: Internal Medicine

## 2014-02-17 VITALS — BP 118/60 | HR 68 | Ht 69.0 in | Wt 205.0 lb

## 2014-02-17 DIAGNOSIS — K5900 Constipation, unspecified: Secondary | ICD-10-CM

## 2014-02-17 DIAGNOSIS — K5909 Other constipation: Secondary | ICD-10-CM

## 2014-02-17 MED ORDER — COLCHICINE 0.6 MG PO TABS
0.6000 mg | ORAL_TABLET | Freq: Every day | ORAL | Status: DC
Start: 2014-02-17 — End: 2016-05-17

## 2014-02-17 MED ORDER — POLYETHYLENE GLYCOL 3350 17 GM/SCOOP PO POWD: 34.0000 g | Freq: Every day | ORAL | Status: DC

## 2014-02-17 NOTE — Patient Instructions (Addendum)
Dr Leone PayorGessner recommends that you complete a bowel purge (to clean out your bowels). Please do the following: Purchase a bottle of Miralax over the counter .  You will then drink 6-8 capfuls of Miralax mixed in an adequate amount of water/juice/gatorade (you may choose which of these liquids to drink) over the next 2-3 hours. You should expect results within 1 to 6 hours after completing the bowel purge.  Dr. Leone PayorGessner has increased your Miralax to 2 doses a day.  Split that up or do it once a day.  A total of 34 grams per day.  We have sent the following medications to your pharmacy for you to pick up at your convenience: Colchicine  For constipation  Call us in a month with an update on how your doing.   I appreciate the opportunity to care for you.

## 2014-02-17 NOTE — Progress Notes (Signed)
   Subjective:    Patient ID: Matthew Bridges, male    DOB: [birth year], 80 y.o.   MRN: [id number]  HPI Having worsening chronic constipation. Long hx of this ever since heart surgery wife says. Now bisacodyl cramping and nauseating but not working like it used to. Believe he tried Amitiza in past.  Medications, allergies, past medical history, past surgical history, family history and social history are reviewed and updated in the EMR.  Review of Systems As above    Objective:   Physical Exam Elderly NAD abd soft and NT    Assessment & Plan:  Chronic constipation Sitz marks has shown inertia-like findings Colonoscopy 2011 for same problem negative (polyps) Last colonoscopy  Will give MiraLax purge - sans bisacodyl Increase MiraLax to 2 doses daily Try daily colchicine 0.6 mg qd F/U Prn

## 2014-02-17 NOTE — Assessment & Plan Note (Signed)
Sitz marks has shown inertia-like findings Colonoscopy 2011 for same problem negative (polyps) Last colonoscopy  Will give MiraLax purge - sans bisacodyl Increase MiraLax to 2 doses daily Try daily colchicine 0.6 mg qd F/U Prn

## 2014-03-16 ENCOUNTER — Telehealth: Payer: Self-pay | Admitting: Family Medicine

## 2014-03-16 NOTE — Telephone Encounter (Signed)
States husband has not gotten better.  States has gotten worse since injection and is requesting a call back in regards.  Transferred to nurse line as well.

## 2014-03-17 ENCOUNTER — Telehealth: Payer: Self-pay | Admitting: *Deleted

## 2014-03-17 NOTE — Telephone Encounter (Signed)
[name] - Client TELEPHONE ADVICE RECORD [name] Patient Name: Matthew RamsayHUBERT Bridges Gender: Male DOB: 11-02-1930 Age: 80 Y 5 M 7 D Return Phone Number: [phone] (Primary) Address: [address] City[place]State[place]Zip: [place] [address] Client [name] - Client Client Site [name] Day Physician Terrilee FilesSmith, Zach Contact Type Call Call Type Triage / Clinical Caller Name francis Relationship To Patient Spouse Return Phone Number [phone] (Primary) Chief Complaint Hip pain Initial Comment Caller states that husband was given a shot in his groin for a hip problem. Is not getting any better, requesting advice. PreDisposition Home Care Nurse Assessment Nurse: Chrys RacerKoenig, RN, Alexia FreestoneAnna Marie Date/Time Lamount Cohen(Eastern Time): 03/16/2014 3:41:39 PM Confirm and document reason for call. If symptomatic, describe symptoms. ---Caller states that husband was given a shot in his groin for a hip problem. Is not getting any better, requesting advice. Given 3-4 weeks ago Has the patient traveled out of the country within the last 30 days? ---No Does the patient require triage? ---Yes Related visit to physician within the last 2 weeks? ---No Does the PT have any chronic conditions? (i.e. diabetes, asthma, etc.) ---Yes List chronic conditions. ---heart problems, arthritis, osteoporosis, high blood pressure Guidelines Guideline Title Affirmed Question Affirmed Notes Nurse Date/Time (Eastern Time) Hip Pain [1] MODERATE pain (e.g., interferes with normal activities, limping) AND [2] present > 3 days Chrys RacerKoenig, RN, Alexia Freestonenna Marie 03/16/2014 3:45:30 PM Disp. Time Lamount Cohen(Eastern Time) Disposition Final User 03/16/2014 3:49:25 PM See PCP When Office is Open (within 3 days) Yes Chrys RacerKoenig, RN, Alexia FreestoneAnna Marie Caller Understands: Yes PLEASE NOTE: All timestamps contained within this report are represented as [place] Standard Time. CONFIDENTIALTY  NOTICE: This fax transmission is intended only for the addressee. It contains information that is legally privileged, confidential or otherwise protected from use or disclosure. If you are not the intended recipient, you are strictly prohibited from reviewing, disclosing, copying using or disseminating any of this information or taking any action in reliance on or regarding this information. If you have received this fax in error, please notify us immediately by telephone so that we can arrange for its return to us. Phone: [phone], Toll-Free: [phone], Fax: [phone] Page: 2 of 2 Call Id: [id number] Disagree/Comply: Comply Care Advice Given Per Guideline SEE PCP WITHIN 3 DAYS: You need to be examined within 2 or 3 days. Call your doctor during regular office hours and make an appointment. (Note: if office will be open tomorrow, tell caller to call then, not in 3 days). REST YOUR HIP for the next couple days: * Avoid activities that worsen your pain. * Reduce activities that put a lot of strain on the hip joint (e.g., stair climbing, running). LOCAL HEAT: Apply a warm wet washcloth or heating pad for 10 minutes three times daily to help increase circulation and improve healing. PAIN MEDICINES: * For pain relief, take acetaminophen, ibuprofen, or naproxen. * Use the lowest amount that makes your pain feel better. ACETAMINOPHEN (E.G., TYLENOL): * Take 650 mg (two 325 mg pills) by mouth every 4-6 hours as needed. Each Regular Strength Tylenol pill has 325 mg of acetaminophen. The most you should take each day is 3,250 mg (10 Regular Strength pills a day). CALL BACK IF: * Fever or severe pain occurs * Skin redness or a rash appears * You become worse. CARE ADVICE given per Hip Pain (Adult) guideline. After Care Instructions Given Call Event Type User Date / Time Description

## 2014-03-18 NOTE — Telephone Encounter (Signed)
Called pt, spoke to his wife. She stated that his hip was not doing any better & that Dr. Katrinka BlazingSmith recommended a hip replacement. The pt's wife stated no that he was too old. I offered them an appt to come back in to see Dr. Katrinka BlazingSmith, she stated she would call back.

## 2014-03-31 ENCOUNTER — Other Ambulatory Visit: Payer: Self-pay | Admitting: Internal Medicine

## 2014-04-08 ENCOUNTER — Other Ambulatory Visit: Payer: Self-pay | Admitting: Radiology

## 2014-04-08 DIAGNOSIS — I6523 Occlusion and stenosis of bilateral carotid arteries: Secondary | ICD-10-CM

## 2014-05-03 ENCOUNTER — Other Ambulatory Visit: Payer: Self-pay | Admitting: Internal Medicine

## 2014-05-14 ENCOUNTER — Other Ambulatory Visit: Payer: Self-pay | Admitting: Cardiovascular Disease

## 2014-05-18 ENCOUNTER — Ambulatory Visit (INDEPENDENT_AMBULATORY_CARE_PROVIDER_SITE_OTHER): Payer: Medicare HMO | Admitting: Cardiovascular Disease

## 2014-05-18 ENCOUNTER — Encounter: Payer: Self-pay | Admitting: Cardiovascular Disease

## 2014-05-18 ENCOUNTER — Ambulatory Visit (HOSPITAL_COMMUNITY): Payer: Medicare HMO | Attending: Cardiovascular Disease | Admitting: Cardiology

## 2014-05-18 VITALS — BP 128/60 | HR 84 | Ht 71.0 in | Wt 209.0 lb

## 2014-05-18 DIAGNOSIS — R609 Edema, unspecified: Secondary | ICD-10-CM

## 2014-05-18 DIAGNOSIS — I480 Paroxysmal atrial fibrillation: Secondary | ICD-10-CM

## 2014-05-18 DIAGNOSIS — R42 Dizziness and giddiness: Secondary | ICD-10-CM | POA: Diagnosis not present

## 2014-05-18 DIAGNOSIS — Z951 Presence of aortocoronary bypass graft: Secondary | ICD-10-CM

## 2014-05-18 DIAGNOSIS — I6523 Occlusion and stenosis of bilateral carotid arteries: Secondary | ICD-10-CM | POA: Diagnosis not present

## 2014-05-18 DIAGNOSIS — I739 Peripheral vascular disease, unspecified: Secondary | ICD-10-CM

## 2014-05-18 DIAGNOSIS — E785 Hyperlipidemia, unspecified: Secondary | ICD-10-CM

## 2014-05-18 DIAGNOSIS — I779 Disorder of arteries and arterioles, unspecified: Secondary | ICD-10-CM | POA: Diagnosis not present

## 2014-05-18 DIAGNOSIS — N400 Enlarged prostate without lower urinary tract symptoms: Secondary | ICD-10-CM

## 2014-05-18 MED ORDER — FUROSEMIDE 20 MG PO TABS: 20.0000 mg | ORAL_TABLET | Freq: Every day | ORAL | Status: DC | PRN

## 2014-05-18 NOTE — Progress Notes (Signed)
Carotid duplex performed 

## 2014-05-18 NOTE — Patient Instructions (Signed)
Your physician wants you to follow-up in:  6 MONTHS WITH DR NISHAN  You will receive a reminder letter in the mail two months in advance. If you don't receive a letter, please call our office to schedule the follow-up appointment. Your physician recommends that you continue on your current medications as directed. Please refer to the Current Medication list given to you today. 

## 2014-05-18 NOTE — Assessment & Plan Note (Signed)
Known moderate bilateral disease no TIA  F/u duplex in 6 months Reviewed US from today and no change

## 2014-05-18 NOTE — Assessment & Plan Note (Signed)
Stable with no angina and good activity level.  Continue medical Rx  

## 2014-05-18 NOTE — Progress Notes (Signed)
Patient ID: Matthew RamsayHubert Bridges, male   DOB: 1931/03/07, 81 y.o.   MRN: [id number] Debilitated 81 y.o.  post CABG 03/31/11 Post op afib. Had malaise and low HR. Lopresser decreased. Has a tremor that seems to be worse since CABG. Needs F/U with neuro. No SSCP    Known carotid disease. Duplex done 9/15 reviewed   60-79% RICA and 60-79% LICA Systolic velocity on left is 3.1cm   His dizzyness is related to lorazapam that he has taken for 15 years. He saw Dr Graciela HusbandsKlein who did not think tilt table testing needed. He has a very labile personality with outbursts of agression and I am not sure that his psychiatric condition has been fully evaluated.   His swimmy headed feeling is an inner ear issue  Echo 12/12/12 EF 55-60% no vlave disease    ROS: Denies fever, malais, weight loss, blurry vision, decreased visual acuity, cough, sputum, SOB, hemoptysis, pleuritic pain, palpitaitons, heartburn, abdominal pain, melena, lower extremity edema, claudication, or rash.  All other systems reviewed and negative  General: Affect appropriate Healthy:  appears stated age HEENT: normal Neck supple with no adenopathy JVP normal no bruits no thyromegaly Lungs clear with no wheezing and good diaphragmatic motion Heart:  S1/S2 no murmur, no rub, gallop or click PMI normal Abdomen: benighn, BS positve, no tenderness, no AAA no bruit.  No HSM or HJR Distal pulses intact with no bruits No edema Neuro non-focal Skin warm and dry No muscular weakness   Current Outpatient Prescriptions  Medication Sig Dispense Refill  . aspirin 81 MG tablet Take 81 mg by mouth daily.    . bisacodyl (DULCOLAX) 5 MG EC tablet Take 15 mg by mouth daily as needed.    . colchicine 0.6 MG tablet Take 1 tablet (0.6 mg total) by mouth daily. 30 tablet 5  . furosemide (LASIX) 20 MG tablet Take 1 tablet (20 mg total) by mouth daily as needed. 30 tablet 6  . LORazepam (ATIVAN) 0.5 MG tablet TAKE ONE TABLET BY MOUTH TWICE DAILY AS NEEDED FOR  ANXIETY 60 tablet 5  . metoprolol tartrate (LOPRESSOR) 25 MG tablet TAKE ONE TABLET BY MOUTH TWICE DAILY 60 tablet 3  . omeprazole (PRILOSEC) 20 MG capsule TAKE ONE CAPSULE BY MOUTH ONCE DAILY 30 capsule 1  . polyethylene glycol powder (GLYCOLAX/MIRALAX) powder Take 34 g by mouth daily. 255 g    No current facility-administered medications for this visit.    Allergies  Alprazolam; Codeine; Dulcolax; Penicillins; and Tramadol  Electrocardiogram:  SR rate 59  ? Old IMI nonspecific lateral T wave changes  8/15   Assessment and Plan

## 2014-05-18 NOTE — Assessment & Plan Note (Signed)
With spastic bladder has seen urology but meds not effective  Will use lasix PRN for edema given issues of frequent urination

## 2014-05-18 NOTE — Assessment & Plan Note (Addendum)
Maint NSR  No palpitations continue ASA and beta blocker

## 2014-05-18 NOTE — Assessment & Plan Note (Signed)
Cholesterol is at goal.  Continue current dose of statin and diet Rx.  No myalgias or side effects.  F/U  LFT's in 6 months. No results found for: LDLCALC           

## 2014-07-16 ENCOUNTER — Other Ambulatory Visit: Payer: Self-pay | Admitting: Internal Medicine

## 2014-07-27 ENCOUNTER — Encounter: Payer: Self-pay | Admitting: Family Medicine

## 2014-07-27 ENCOUNTER — Ambulatory Visit (INDEPENDENT_AMBULATORY_CARE_PROVIDER_SITE_OTHER): Payer: Medicare HMO | Admitting: Family Medicine

## 2014-07-27 VITALS — BP 140/68 | HR 74 | Ht 71.0 in | Wt 209.0 lb

## 2014-07-27 DIAGNOSIS — M1990 Unspecified osteoarthritis, unspecified site: Secondary | ICD-10-CM

## 2014-07-27 DIAGNOSIS — M1611 Unilateral primary osteoarthritis, right hip: Secondary | ICD-10-CM

## 2014-07-27 MED ORDER — KETOROLAC TROMETHAMINE 60 MG/2ML IM SOLN
60.0000 mg | Freq: Once | INTRAMUSCULAR | Status: AC
Start: 2014-07-27 — End: 2014-07-27
  Administered 2014-07-27: 60 mg via INTRAMUSCULAR

## 2014-07-27 MED ORDER — METHYLPREDNISOLONE ACETATE 80 MG/ML IJ SUSP
80.0000 mg | Freq: Once | INTRAMUSCULAR | Status: AC
  Administered 2014-07-27: 80 mg via INTRAMUSCULAR

## 2014-07-27 MED ORDER — TRAMADOL HCL 50 MG PO TABS: 50.0000 mg | ORAL_TABLET | Freq: Every evening | ORAL | Status: DC | PRN

## 2014-07-27 NOTE — Progress Notes (Signed)
  Matthew Bridges D.O. [facility] [address] [place], [place] [address] Phone: [phone]) [phone] Subjective:     CC:  hip pain right sided   BJY:NWGNFAOZHYHPI:Subjective Matthew Bridges is a 81 y.o. male patient is coming back for followup of his pain.. Patient's pain seemed to be right-sided but there is also potential for radicular symptoms from his back. Patient has had chronic back pain previously. Patient did have an MRI of his back in November that only showed mild lumbar degenerative changes without any nerve root impingement.  Patient also had a right-sided hip pathology. Patient does have moderate osteophytic changes noted this as well. Patient was given a hip injection greater than 6 months ago and patient states that he did not make any significant improvement. Patient states that he is having difficulty even with daily activities. Patient states walking further than 100 feet can be severely painful. Every step is a sharp pain in the groin. States that he feels like he is limited some of his range of motion. Patient feel somewhat unstable and is using a cane now. Patient states that he even has a dull throbbing pain at rest. Patient does have tramadol but unfortunately makes him sleepy. Tries to avoid other pain medications secondary to he feels that they do not work.        Past medical history, social, surgical and family history all reviewed in electronic medical record.   Review of Systems: No headache, visual changes, nausea, vomiting, diarrhea, constipation, dizziness, abdominal pain, skin rash, fevers, chills, night sweats, weight loss, swollen lymph nodes, body aches, joint swelling, muscle aches, chest pain, shortness of breath, mood changes.   Objective Blood pressure 140/68, pulse 74, height 5\' 11"  (1.803 m), weight 209 lb (94.802 kg), SpO2 97 %.  General: No apparent distress alert and oriented x3 mood and affect normal, dressed appropriately. frail HEENT: Pupils equal,  extraocular movements intact  Respiratory: Patient's speak in full sentences and does not appear short of breath  Cardiovascular: No lower extremity edema, non tender, no erythema  Skin: Warm dry intact with no signs of infection or rash on extremities or on axial skeleton.  Abdomen: Soft nontender  Neuro: Cranial nerves II through XII are intact, neurovascularly intact in all extremities with 2+ DTRs and 2+ pulses.  Lymph: No lymphadenopathy of posterior or anterior cervical chain or axillae bilaterally.  Gait antalgic gait and work walking with a cane which is new MSK:  Non tender with full range of motion and good stability and symmetric strength and tone of , elbows, wrist,  knee and ankles bilaterally.    Hip: right ROM IR: 15 Deg, with severe discomfort ER: 45 Deg, Flexion: 100 Deg, Extension: 100 Deg, Abduction: 45 Deg, Adduction: 45 Deg Strength IR: 4/5, ER: 5/5, Flexion: 5/5, Extension: 5/5, Abduction: 5/5, Adduction: 5/5 Pelvic alignment unremarkable to inspection and palpation. Standing hip rotation and gait without trendelenburg sign / unsteadiness. Greater trochanter without tenderness to palpation. No tenderness over piriformis and greater trochanter.  Severe pain with internal rotation of the hip      Impression and Recommendations:     This case required medical decision making of moderate complexity. Spent greater than 25 minutes with patient face-to-face and had greater than 50% of counseling including as described above in assessment and plan.

## 2014-07-27 NOTE — Progress Notes (Signed)
Pre visit review using our clinic review tool, if applicable. No additional management support is needed unless otherwise documented below in the visit note. 

## 2014-07-27 NOTE — Patient Instructions (Addendum)
Good to see you again.  I am sorry you are in pain We will try tramadol at night to help  And take with 1 tylenol.  2 injections today for pain.  [facility] Turner Danielsowan [facility]  [phone]  [address]  Call me if you have questions.

## 2014-07-27 NOTE — Assessment & Plan Note (Signed)
Vision has had significant progression and this is affecting his daily activities. Patient is unable to ambulate correctly without significant pain. Patient does need a hip replacement likely. Patient does have some other comorbidities but I do think that he is a surgical candidate. Patient is going to follow-up and be referred to an orthopedic surgeon. Patient was given up her prescription for tramadol for breakthrough pain at night. Patient will continue his other over-the-counter pain medications in patient will come back and see me on an as-needed basis. Discussed that we can answer any questions they may have. Toradol and Depo-Medrol given today.  Spent  25 minutes with patient face-to-face and had greater than 50% of counseling including as described above in assessment and plan.

## 2014-08-14 ENCOUNTER — Other Ambulatory Visit: Payer: Self-pay | Admitting: Orthopedic Surgery

## 2014-08-14 DIAGNOSIS — M4854XA Collapsed vertebra, not elsewhere classified, thoracic region, initial encounter for fracture: Secondary | ICD-10-CM

## 2014-08-14 DIAGNOSIS — M4850XA Collapsed vertebra, not elsewhere classified, site unspecified, initial encounter for fracture: Secondary | ICD-10-CM

## 2014-08-14 DIAGNOSIS — S32010S Wedge compression fracture of first lumbar vertebra, sequela: Secondary | ICD-10-CM

## 2014-08-26 ENCOUNTER — Ambulatory Visit
Admission: RE | Admit: 2014-08-26 | Discharge: 2014-08-26 | Disposition: A | Discharge status: Home / Self Care | Payer: Medicare HMO | Source: Ambulatory Visit | Attending: Orthopedic Surgery | Admitting: Orthopedic Surgery

## 2014-08-26 DIAGNOSIS — S32010S Wedge compression fracture of first lumbar vertebra, sequela: Secondary | ICD-10-CM

## 2014-08-26 DIAGNOSIS — M4854XA Collapsed vertebra, not elsewhere classified, thoracic region, initial encounter for fracture: Secondary | ICD-10-CM

## 2014-09-03 ENCOUNTER — Other Ambulatory Visit: Payer: Self-pay | Admitting: Orthopedic Surgery

## 2014-09-03 DIAGNOSIS — R52 Pain, unspecified: Secondary | ICD-10-CM

## 2014-09-03 DIAGNOSIS — R531 Weakness: Secondary | ICD-10-CM

## 2014-12-08 ENCOUNTER — Ambulatory Visit (INDEPENDENT_AMBULATORY_CARE_PROVIDER_SITE_OTHER): Payer: Medicare HMO | Admitting: Pulmonary Disease

## 2014-12-08 ENCOUNTER — Ambulatory Visit (INDEPENDENT_AMBULATORY_CARE_PROVIDER_SITE_OTHER)
Admission: RE | Admit: 2014-12-08 | Discharge: 2014-12-08 | Disposition: A | Discharge status: Home / Self Care | Payer: Medicare HMO | Source: Ambulatory Visit | Attending: Pulmonary Disease | Admitting: Pulmonary Disease

## 2014-12-08 ENCOUNTER — Other Ambulatory Visit: Payer: Medicare HMO

## 2014-12-08 ENCOUNTER — Encounter: Payer: Self-pay | Admitting: Pulmonary Disease

## 2014-12-08 ENCOUNTER — Telehealth: Payer: Self-pay | Admitting: Pulmonary Disease

## 2014-12-08 VITALS — BP 114/62 | HR 68 | Temp 98.4°F | Ht 71.0 in | Wt 205.6 lb

## 2014-12-08 DIAGNOSIS — J479 Bronchiectasis, uncomplicated: Secondary | ICD-10-CM | POA: Diagnosis not present

## 2014-12-08 DIAGNOSIS — J679 Hypersensitivity pneumonitis due to unspecified organic dust: Secondary | ICD-10-CM | POA: Diagnosis not present

## 2014-12-08 DIAGNOSIS — J8410 Pulmonary fibrosis, unspecified: Secondary | ICD-10-CM

## 2014-12-08 MED ORDER — GUAIFENESIN ER 600 MG PO TB12: 1200.0000 mg | ORAL_TABLET | Freq: Two times a day (BID) | ORAL | Status: AC | PRN

## 2014-12-08 MED ORDER — BUDESONIDE-FORMOTEROL FUMARATE 160-4.5 MCG/ACT IN AERO
2.0000 | INHALATION_SPRAY | Freq: Two times a day (BID) | RESPIRATORY_TRACT | Status: DC
Start: 2014-12-08 — End: 2016-05-17

## 2014-12-08 NOTE — Progress Notes (Signed)
Chief Complaint  Patient presents with  . Acute Visit    PW pt - last seen 03/2013.  increased cough with thick, clear mucus and increased SOB x 3 months.  Cough worse qhs.  No chest tightness, CP, wheezing, or f/c/s.    History of Present Illness: Matthew Bridges is a 81 y.o. male he has hx of HP with BTX and pulmonary fibrosis.  He has been followed by Dr. Delford Field.  He has noticed progressive dyspnea over the past few months.  He can now only walk about 50 feet.  He has been getting more cough with clear sputum.  He can cough up to 3 cups full of phlegm in a day.  He denies fever, but does get sweats and chills.  He denies hemoptysis.  He has noticed wheezing and chest tightness.  He is not currently using any inhalers.  He has not recently been on antibiotics or prednisone.  He was not able to complete 3 laps due to feeling short of breath; only did one lap.  He maintained his oxygen and HR went from 60's to 90's.  He has noticed swelling in his left ankle >> gets better when he keeps his leg up.  TESTS: IGE 10/01/10 >> 406.6 HP panel 10/11/10 >> negative Allergy panel 10/11/10 >> borderline allergies IGG, IGA, IGM 10/11/10 >> normal Fungal panel 10/11/10 >> negative PFT 03/20/11>> FEV1 2.78 (107%), FEV1% 75, TLC 5.76 (94%), DLCO 64%, no BD Echo 12/12/12 >> EF 55 to 65%, grade 1 diastolic dysfx   Past medical hx >> HLD, GERD, HH, CAD s/p CABG, Anxiety, BPH  Past surgical hx, Medications, Allergies, Family hx, Social hx all reviewed.   Physical Exam: BP 114/62 mmHg  Pulse 68  Temp(Src) 98.4 F (36.9 C) (Oral)  Ht  (1.803 m)  Wt 205 lb 9.6 oz (93.26 kg)  BMI 28.69 kg/m2  SpO2 97%  General - No distress ENT - No sinus tenderness, no oral exudate, no LAN Cardiac - s1s2 regular, no murmur Chest - faint basilar crackles, no wheeze Back - No focal tenderness Abd - Soft, non-tender Ext - No edema Neuro - Normal strength Skin - No rashes Psych - normal mood, and  behavior  Spirometry today showed mild restriction.  Assessment/Plan:  Progressive dyspnea and productive cough in setting of HP with pulmonary fibrosis and BTX. Plan: - will have him try symbicort - he can try mucinex prn - will get chest xray today - will arrange for sputum for culture, AFB, and fungal culture - depending on response/results he might then need full PFTs and/or repeat CT chest   Coralyn Helling, MD [facility] Pager:  [phone]

## 2014-12-08 NOTE — Telephone Encounter (Signed)
Dg Chest 2 View  12/08/2014   CLINICAL DATA:  Pulmonary fibrosis, hypersensitivity pneumonitis, bronchiectasis and prior coronary bypass  EXAM: CHEST  2 VIEW  COMPARISON:  08/19/2012  FINDINGS: Prior median sternotomy noted. Stable borderline heart size but normal vascularity. No current CHF or edema. Stable apical pleural parenchymal scarring.  Stable mild basilar fibrotic pattern. No new superimposed pneumonia, collapse or consolidation. No effusion or pneumothorax. Trachea midline. Degenerative changes of the spine.  IMPRESSION: Stable chronic and postoperative findings as before. No superimposed acute process.   Electronically Signed   By: Judie Petit.  Shick M.D.   On: 12/08/2014 16:43    Will have my nurse inform pt that CXR shows chronic changes from hx of hypersensitivity pneumonitis and pulmonary fibrosis.  No new findings.

## 2014-12-08 NOTE — Patient Instructions (Signed)
Chest xray today Symbicort two puffs twice per day >> rinse mouth after each use Mucinex 1200 mg twice per day as needed to loosen phlegm and help with cough Will arrange for samples of phlegm for culture  Follow up with Dr. Craige Cotta or Tammy Parrett in 2 weeks

## 2014-12-11 ENCOUNTER — Other Ambulatory Visit: Payer: Medicare HMO

## 2014-12-11 DIAGNOSIS — J8410 Pulmonary fibrosis, unspecified: Secondary | ICD-10-CM

## 2014-12-11 DIAGNOSIS — J679 Hypersensitivity pneumonitis due to unspecified organic dust: Secondary | ICD-10-CM

## 2014-12-11 DIAGNOSIS — J479 Bronchiectasis, uncomplicated: Secondary | ICD-10-CM

## 2014-12-14 NOTE — Telephone Encounter (Signed)
Pt wife cb for results [phone]

## 2014-12-14 NOTE — Telephone Encounter (Signed)
Pt's wife is aware of results. Nothing further was needed. 

## 2014-12-15 LAB — LOWER RESPIRATORY CULTURE

## 2014-12-16 ENCOUNTER — Encounter (HOSPITAL_COMMUNITY): Payer: Medicare HMO

## 2014-12-24 ENCOUNTER — Ambulatory Visit: Payer: Medicare HMO | Admitting: Adult Health

## 2015-01-11 ENCOUNTER — Other Ambulatory Visit: Payer: Self-pay | Admitting: Cardiovascular Disease

## 2015-01-11 DIAGNOSIS — I6523 Occlusion and stenosis of bilateral carotid arteries: Secondary | ICD-10-CM

## 2015-01-13 ENCOUNTER — Other Ambulatory Visit: Payer: Self-pay | Admitting: Cardiovascular Disease

## 2015-01-18 ENCOUNTER — Telehealth: Payer: Self-pay | Admitting: Pulmonary Disease

## 2015-01-18 NOTE — Telephone Encounter (Signed)
His sputum culture did not grow any bacteria.  He was supposed to have sputum for AFB and fungal culture sent also.  It does not appear these were collected.  Please re-order.

## 2015-01-18 NOTE — Telephone Encounter (Signed)
Pt's wife calling to get results from Sputum Culture on 12/11/14. Dr. Craige CottaSood, please advise.

## 2015-01-19 NOTE — Telephone Encounter (Signed)
Noted  

## 2015-01-19 NOTE — Telephone Encounter (Signed)
AFB was collected on 12/11/14, no results have been received yet for this.  Patient's wife notified of Sputum culture results. [place] AFB results.  FYI to Dr. Craige CottaSood

## 2015-01-21 ENCOUNTER — Ambulatory Visit (HOSPITAL_COMMUNITY)
Admission: RE | Admit: 2015-01-21 | Discharge: 2015-01-21 | Disposition: A | Discharge status: Home / Self Care | Payer: Medicare HMO | Source: Ambulatory Visit | Attending: Cardiovascular Disease | Admitting: Cardiovascular Disease

## 2015-01-21 DIAGNOSIS — E785 Hyperlipidemia, unspecified: Secondary | ICD-10-CM | POA: Insufficient documentation

## 2015-01-21 DIAGNOSIS — I6523 Occlusion and stenosis of bilateral carotid arteries: Secondary | ICD-10-CM | POA: Insufficient documentation

## 2015-01-21 NOTE — Progress Notes (Signed)
Patient ID: Matthew Bridges, male   DOB: January 24, 1931, 81 y.o.   MRN: [id number] Debilitated 81 y.o.  post CABG 03/31/11 Post op afib. Had malaise and low HR. Lopresser decreased. Has a tremor that seems to be worse since CABG. Needs F/U with neuro. No SSCP    Known carotid disease. Duplex done 01/21/15  reviewed   60-79% RICA and 60-79% LICA Systolic velocity on left is 3.1cm   His dizzyness is related to lorazapam that he has taken for 15 years. He saw Dr Graciela Husbands who did not think tilt table testing needed. He has a very labile personality with outbursts of agression and I am not sure that his psychiatric condition has been fully evaluated.   His swimmy headed feeling is an inner ear issue  Echo 12/12/12 EF 55-60% no vlave disease   Depressed  Always feels bad His paperwork had every box checked on ROS  Biggest issue is right hip/sciatic pain  ROS: Denies fever, malais, weight loss, blurry vision, decreased visual acuity, cough, sputum, SOB, hemoptysis, pleuritic pain, palpitaitons, heartburn, abdominal pain, melena, lower extremity edema, claudication, or rash.  All other systems reviewed and negative  General: Affect appropriate Healthy:  appears stated age HEENT: normal Neck supple with no adenopathy JVP normal no bruits no thyromegaly Lungs clear with no wheezing and good diaphragmatic motion Heart:  S1/S2 no murmur, no rub, gallop or click PMI normal Abdomen: benighn, BS positve, no tenderness, no AAA no bruit.  No HSM or HJR Distal pulses intact with no bruits No edema Neuro non-focal Skin warm and dry No muscular weakness   Current Outpatient Prescriptions  Medication Sig Dispense Refill  . aspirin 81 MG tablet Take 81 mg by mouth daily.    . bisacodyl (DULCOLAX) 5 MG EC tablet Take 15 mg by mouth daily as needed for mild constipation, moderate constipation or severe constipation.     . budesonide-formoterol (SYMBICORT) 160-4.5 MCG/ACT inhaler Inhale 2 puffs into the lungs  2 (two) times daily. 1 Inhaler 12  . colchicine 0.6 MG tablet Take 1 tablet (0.6 mg total) by mouth daily. 30 tablet 5  . furosemide (LASIX) 20 MG tablet Take 20 mg by mouth daily as needed for fluid or edema.    Marland Kitchen guaiFENesin (MUCINEX) 600 MG 12 hr tablet Take 2 tablets (1,200 mg total) by mouth 2 (two) times daily as needed for cough or to loosen phlegm.    Marland Kitchen LORazepam (ATIVAN) 0.5 MG tablet TAKE ONE TABLET BY MOUTH TWICE DAILY AS NEEDED FOR ANXIETY 60 tablet 5  . metoprolol tartrate (LOPRESSOR) 25 MG tablet TAKE ONE TABLET BY MOUTH TWICE DAILY 60 tablet 0  . omeprazole (PRILOSEC) 20 MG capsule TAKE ONE CAPSULE BY MOUTH ONCE DAILY 30 capsule 1  . polyethylene glycol powder (GLYCOLAX/MIRALAX) powder Take 34 g by mouth daily. (Patient taking differently: Take 34 g by mouth daily as needed. ) 255 g    No current facility-administered medications for this visit.    Allergies  Alprazolam; Codeine; Dulcolax; Penicillins; and Tramadol  Electrocardiogram:  SR rate 59  ? Old IMI nonspecific lateral T wave changes  8/15  01/21/15  SR nonspecific lateral T wave changes   Assessment and Plan CAD/CABG Stable with no angina and good activity level.  Continue medical Rx Carotid: stable 60-79% bilateral disease f/u duplex year Dizzyness: non cardiac f/u primary  Bradycardia:better on lower dose beta blocker  PAF: maint NSR no need for anticoagulation continue low dose beta blocker Sciatica:  Has  a meeting with [facility].  Previously seen by Ortho for ? Hip problem Gave him Dr Ethelene Halamos name if he needs pain Rx Anemia:  F/u primary looks pale and apparently has anemia   Charlton HawsPeter Nishan

## 2015-01-22 ENCOUNTER — Ambulatory Visit (INDEPENDENT_AMBULATORY_CARE_PROVIDER_SITE_OTHER): Payer: Medicare HMO | Admitting: Cardiovascular Disease

## 2015-01-22 ENCOUNTER — Encounter: Payer: Self-pay | Admitting: Cardiovascular Disease

## 2015-01-22 VITALS — BP 108/60 | HR 70 | Ht 71.0 in | Wt 207.1 lb

## 2015-01-22 DIAGNOSIS — I779 Disorder of arteries and arterioles, unspecified: Secondary | ICD-10-CM

## 2015-01-22 DIAGNOSIS — I739 Peripheral vascular disease, unspecified: Principal | ICD-10-CM

## 2015-01-22 NOTE — Patient Instructions (Addendum)
Medication Instructions:  NO CHANGES  LabworK NONE  Testing/Procedures: NONE  Follow-Up: Your physician wants you to follow-up in: YEAR  WITH  DR Haywood FillerNISHAN   You will receive a reminder letter in the mail two months in advance. If you don't receive a letter, please call our office to schedule the follow-up appointment.  Any Other Special Instructions Will Be Listed Below (If Applicable).  DR  Sheran LuzICHARD  RAMOS   [address]   [place]   [phone]  [phone]   If you need a refill on your cardiac medications before your next appointment, please call your pharmacy.

## 2015-02-05 LAB — AFB CULTURE WITH SMEAR (NOT AT ARMC)
ACID FAST SMEAR: NEGATIVE
Acid Fast Culture: NEGATIVE

## 2015-02-09 DIAGNOSIS — D696 Thrombocytopenia, unspecified: Secondary | ICD-10-CM

## 2015-02-09 HISTORY — DX: Thrombocytopenia, unspecified: D69.6

## 2015-03-15 ENCOUNTER — Other Ambulatory Visit: Payer: Self-pay | Admitting: Cardiovascular Disease

## 2015-03-16 DIAGNOSIS — M1611 Unilateral primary osteoarthritis, right hip: Secondary | ICD-10-CM | POA: Insufficient documentation

## 2015-03-16 DIAGNOSIS — R269 Unspecified abnormalities of gait and mobility: Secondary | ICD-10-CM | POA: Insufficient documentation

## 2015-03-16 DIAGNOSIS — I2510 Atherosclerotic heart disease of native coronary artery without angina pectoris: Secondary | ICD-10-CM

## 2015-03-16 DIAGNOSIS — F411 Generalized anxiety disorder: Secondary | ICD-10-CM | POA: Insufficient documentation

## 2015-03-16 DIAGNOSIS — G8929 Other chronic pain: Secondary | ICD-10-CM

## 2015-03-16 DIAGNOSIS — K219 Gastro-esophageal reflux disease without esophagitis: Secondary | ICD-10-CM

## 2015-03-16 DIAGNOSIS — R32 Unspecified urinary incontinence: Secondary | ICD-10-CM | POA: Insufficient documentation

## 2015-03-16 DIAGNOSIS — K5730 Diverticulosis of large intestine without perforation or abscess without bleeding: Secondary | ICD-10-CM | POA: Insufficient documentation

## 2015-03-16 DIAGNOSIS — M5441 Lumbago with sciatica, right side: Secondary | ICD-10-CM

## 2015-03-16 DIAGNOSIS — R972 Elevated prostate specific antigen [PSA]: Secondary | ICD-10-CM

## 2015-03-16 HISTORY — DX: Other chronic pain: G89.29

## 2015-03-16 HISTORY — DX: Unilateral primary osteoarthritis, right hip: M16.11

## 2015-03-16 HISTORY — DX: Atherosclerotic heart disease of native coronary artery without angina pectoris: I25.10

## 2015-03-16 HISTORY — DX: Diverticulosis of large intestine without perforation or abscess without bleeding: K57.30

## 2015-03-16 HISTORY — DX: Elevated prostate specific antigen (PSA): R97.20

## 2015-03-16 HISTORY — DX: Lumbago with sciatica, right side: M54.41

## 2015-03-16 HISTORY — DX: Unspecified abnormalities of gait and mobility: R26.9

## 2015-03-16 HISTORY — DX: Generalized anxiety disorder: F41.1

## 2015-03-16 HISTORY — DX: Gastro-esophageal reflux disease without esophagitis: K21.9

## 2015-03-16 HISTORY — DX: Unspecified urinary incontinence: R32

## 2015-06-07 DIAGNOSIS — R42 Dizziness and giddiness: Secondary | ICD-10-CM | POA: Insufficient documentation

## 2015-06-07 DIAGNOSIS — H9193 Unspecified hearing loss, bilateral: Secondary | ICD-10-CM | POA: Insufficient documentation

## 2015-06-07 DIAGNOSIS — H6123 Impacted cerumen, bilateral: Secondary | ICD-10-CM | POA: Insufficient documentation

## 2015-06-07 HISTORY — DX: Impacted cerumen, bilateral: H61.23

## 2015-06-07 HISTORY — DX: Unspecified hearing loss, bilateral: H91.93

## 2015-06-07 HISTORY — DX: Dizziness and giddiness: R42

## 2015-07-12 ENCOUNTER — Other Ambulatory Visit: Payer: Self-pay | Admitting: Cardiovascular Disease

## 2015-07-26 DIAGNOSIS — R2681 Unsteadiness on feet: Secondary | ICD-10-CM | POA: Insufficient documentation

## 2015-07-26 HISTORY — DX: Unsteadiness on feet: R26.81

## 2015-08-05 ENCOUNTER — Ambulatory Visit: Payer: Medicare HMO | Admitting: Internal Medicine

## 2015-09-15 DIAGNOSIS — R531 Weakness: Secondary | ICD-10-CM | POA: Insufficient documentation

## 2015-09-15 HISTORY — DX: Weakness: R53.1

## 2015-10-01 ENCOUNTER — Telehealth: Payer: Self-pay | Admitting: Cardiovascular Disease

## 2015-10-01 DIAGNOSIS — I6523 Occlusion and stenosis of bilateral carotid arteries: Secondary | ICD-10-CM

## 2015-10-01 NOTE — Telephone Encounter (Signed)
Spoke with pt's wife, DPR on file. Advised wife that I did see where Dr. Eden EmmsNishan wanted pt to have a 6 mo f/u carotid done after his last carotid in 01/2015. Advised wife that I would place order and our PV scheduler would give them a call to get the pt scheduled. Wife verbalized understanding and was appreciative for call.

## 2015-10-01 NOTE — Telephone Encounter (Signed)
New Message  Pt wife calling [facility] w/ rN- stated pt is supposed to have 6 mo Carotid doppler- no order/recall. Please call back and discuss.

## 2015-10-05 ENCOUNTER — Telehealth: Payer: Self-pay | Admitting: Cardiovascular Disease

## 2015-10-05 NOTE — Telephone Encounter (Signed)
Called patient's wife back. Informed her that patient is to follow-up with an office visit in 1 year and follow-up with carotid ultra sound in 6 months. Informed patient's wife that having ultra sound and office visit on same day would not work out due to different follow-up times. Informed patient's wife that as soon as results for the carotid ultra sound are in that our office will call them with results. Patient's wife verbalized understanding.

## 2015-10-05 NOTE — Telephone Encounter (Signed)
New message    Pts wife wants to know why an appt wasn't made with Dr. Eden EmmsNishan after his dopler appt. She also wants to know should she cancel the doppler appt until an appt can be made with the Dr.

## 2015-10-12 ENCOUNTER — Ambulatory Visit (HOSPITAL_COMMUNITY)
Admission: RE | Admit: 2015-10-12 | Discharge: 2015-10-12 | Disposition: A | Discharge status: Home / Self Care | Payer: Medicare HMO | Source: Ambulatory Visit | Attending: Cardiovascular Disease | Admitting: Cardiovascular Disease

## 2015-10-12 DIAGNOSIS — J449 Chronic obstructive pulmonary disease, unspecified: Secondary | ICD-10-CM | POA: Insufficient documentation

## 2015-10-12 DIAGNOSIS — I6523 Occlusion and stenosis of bilateral carotid arteries: Secondary | ICD-10-CM

## 2015-10-12 DIAGNOSIS — I2510 Atherosclerotic heart disease of native coronary artery without angina pectoris: Secondary | ICD-10-CM | POA: Diagnosis not present

## 2015-10-12 DIAGNOSIS — E785 Hyperlipidemia, unspecified: Secondary | ICD-10-CM | POA: Diagnosis not present

## 2015-10-12 DIAGNOSIS — F419 Anxiety disorder, unspecified: Secondary | ICD-10-CM | POA: Insufficient documentation

## 2015-10-12 DIAGNOSIS — K219 Gastro-esophageal reflux disease without esophagitis: Secondary | ICD-10-CM | POA: Diagnosis not present

## 2015-10-12 DIAGNOSIS — G4700 Insomnia, unspecified: Secondary | ICD-10-CM | POA: Diagnosis not present

## 2015-10-15 ENCOUNTER — Telehealth: Payer: Self-pay | Admitting: Cardiovascular Disease

## 2015-10-15 DIAGNOSIS — I779 Disorder of arteries and arterioles, unspecified: Secondary | ICD-10-CM

## 2015-10-15 DIAGNOSIS — I739 Peripheral vascular disease, unspecified: Principal | ICD-10-CM

## 2015-10-15 NOTE — Telephone Encounter (Signed)
Left message for patient to call back  

## 2015-10-15 NOTE — Telephone Encounter (Signed)
New message     Patient wife calling carotid doppler was done on 7.25.2017  - waiting on test results.

## 2015-10-18 NOTE — Telephone Encounter (Signed)
Follow Up    Matthew Bridges is calling returning call about test results

## 2015-10-18 NOTE — Telephone Encounter (Signed)
Informed patient that his carotid results will be called to him after Dr. Eden Emms reviews the report. Patient verbalized understanding.

## 2015-10-20 NOTE — Telephone Encounter (Signed)
Patient's wife (DPR) is aware of results.  Notes Recorded by Wendall Stade, MD on 10/19/2015 at 11:14 AM EDT Left ICA 60-79% stenosis. No TIA symptoms. Continue antiplatelet Rx and F/U carotid duplex in 6 months  60-79% RICA stenosis. F/U carotid duplex in 6 months

## 2015-10-20 NOTE — Telephone Encounter (Signed)
F/u message  Pt wife returning call to RN. Pt wife states she received a call to day about results for carotid. Please call back to discuss

## 2015-10-25 DIAGNOSIS — R21 Rash and other nonspecific skin eruption: Secondary | ICD-10-CM | POA: Insufficient documentation

## 2015-10-25 HISTORY — DX: Rash and other nonspecific skin eruption: R21

## 2016-03-01 NOTE — Progress Notes (Deleted)
Patient ID: Matthew Bridges, male   DOB: [birth year], 82 y.o.   MRN: [id number] Debilitated 82 y.o.  post CABG 03/31/11 Post op afib. Had malaise and low HR. Lopresser decreased. Has a tremor that seems to be worse since CABG. Needs F/U with neuro. No SSCP    Known carotid disease. Duplex done 10/12/15   reviewed   60-79% RICA and 60-79% LICA Systolic velocity on left is 3.1cm   His dizzyness is related to lorazapam that he has taken for 15 years. He saw Matthew Bridges who did not think tilt table testing needed. He has a very labile personality with outbursts of agression and I am not sure that his psychiatric condition has been fully evaluated.   His swimmy headed feeling is an inner ear issue  Echo 12/12/12 EF 55-60% no vlave disease   Depressed  Always feels bad His paperwork had every box checked on ROS  Biggest issue is right hip/sciatic pain  ROS: Denies fever, malais, weight loss, blurry vision, decreased visual acuity, cough, sputum, SOB, hemoptysis, pleuritic pain, palpitaitons, heartburn, abdominal pain, melena, lower extremity edema, claudication, or rash.  All other systems reviewed and negative  General: Affect appropriate Healthy:  appears stated age HEENT: normal Neck supple with no adenopathy JVP normal no bruits no thyromegaly Lungs clear with no wheezing and good diaphragmatic motion Heart:  S1/S2 no murmur, no rub, gallop or click PMI normal Abdomen: benighn, BS positve, no tenderness, no AAA no bruit.  No HSM or HJR Distal pulses intact with no bruits No edema Neuro non-focal Skin warm and dry No muscular weakness   Current Outpatient Prescriptions  Medication Sig Dispense Refill  . aspirin 81 MG tablet Take 81 mg by mouth daily.    . bisacodyl (DULCOLAX) 5 MG EC tablet Take 15 mg by mouth daily as needed for mild constipation, moderate constipation or severe constipation.     . budesonide-formoterol (SYMBICORT) 160-4.5 MCG/ACT inhaler Inhale 2 puffs into the lungs  2 (two) times daily. 1 Inhaler 12  . colchicine 0.6 MG tablet Take 1 tablet (0.6 mg total) by mouth daily. 30 tablet 5  . furosemide (LASIX) 20 MG tablet Take 20 mg by mouth daily as needed for fluid or edema.    . furosemide (LASIX) 20 MG tablet TAKE ONE TABLET BY MOUTH ONCE DAILY AS NEEDED 30 tablet 6  . guaiFENesin (MUCINEX) 600 MG 12 hr tablet Take 2 tablets (1,200 mg total) by mouth 2 (two) times daily as needed for cough or to loosen phlegm.    Marland Kitchen. LORazepam (ATIVAN) 0.5 MG tablet TAKE ONE TABLET BY MOUTH TWICE DAILY AS NEEDED FOR ANXIETY 60 tablet 5  . metoprolol tartrate (LOPRESSOR) 25 MG tablet TAKE ONE TABLET BY MOUTH TWICE DAILY 60 tablet 11  . omeprazole (PRILOSEC) 20 MG capsule TAKE ONE CAPSULE BY MOUTH ONCE DAILY 30 capsule 1  . polyethylene glycol powder (GLYCOLAX/MIRALAX) powder Take 34 g by mouth daily. (Patient taking differently: Take 34 g by mouth daily as needed. ) 255 g    No current facility-administered medications for this visit.     Allergies  Alprazolam; Codeine; Dulcolax [bisacodyl]; Penicillins; and Tramadol  Electrocardiogram:  SR rate 59  ? Old IMI nonspecific lateral T wave changes  8/15  01/21/15  SR nonspecific lateral T wave changes   Assessment and Plan CAD/CABG Stable with no angina and good activity level.  Continue medical Rx Carotid: stable 60-79% bilateral disease f/u duplex 10/11/16  Dizzyness: non cardiac f/u primary  Bradycardia:better on lower dose beta blocker  PAF: maint NSR no need for anticoagulation continue low dose beta blocker Sciatica:  Has a meeting with [facility].  Previously seen by Ortho for ? Hip problem Gave him Matthew Matthew Bridges name if he needs pain Rx Anemia:  F/u primary looks pale and apparently has anemia   Matthew Bridges

## 2016-03-02 ENCOUNTER — Ambulatory Visit: Payer: Medicare HMO | Admitting: Cardiovascular Disease

## 2016-05-07 NOTE — Progress Notes (Signed)
Patient ID: Matthew Bridges, male   DOB: October 23, 1930, 83 y.o.   MRN: [id number]   Debilitated 83 y.o.  post CABG 03/31/11 Post op afib. Had malaise and low HR. Lopresser decreased. Has a tremor that seems to be worse since CABG. Needs F/U with neuro. No SSCP    Known carotid disease. Duplex done 01/21/15  reviewed   60-79% RICA and 60-79% LICA Systolic velocity on left is 3.1cm   His dizzyness is related to lorazapam that he has taken for 15 years. He saw Matthew Bridges who did not think tilt table testing needed. He has a very labile personality with outbursts of agression and I am not sure that his psychiatric condition has been fully evaluated.   His swimmy headed feeling is an inner ear issue  Echo 12/12/12 EF 55-60% no vlave disease   Depressed  Always feels bad His paperwork had every box checked on ROS  Biggest issue is right hip/sciatic pain  ROS: Denies fever, malais, weight loss, blurry vision, decreased visual acuity, cough, sputum, SOB, hemoptysis, pleuritic pain, palpitaitons, heartburn, abdominal pain, melena, lower extremity edema, claudication, or rash.  All other systems reviewed and negative  General: Affect appropriate Healthy:  appears stated age HEENT: normal Neck supple with no adenopathy JVP normal no bruits no thyromegaly Lungs clear with no wheezing and good diaphragmatic motion Heart:  S1/S2 no murmur, no rub, gallop or click PMI normal Abdomen: benighn, BS positve, no tenderness, no AAA no bruit.  No HSM or HJR Distal pulses intact with no bruits No edema Neuro non-focal Skin warm and dry No muscular weakness Right Matthew contracture    Current Outpatient Prescriptions  Medication Sig Dispense Refill  . aspirin 81 MG tablet Take 81 mg by mouth daily.    . Bisacodyl (LAXATIVE PO) Take 2 tablets by mouth daily.    . furosemide (LASIX) 20 MG tablet Take 20 mg by mouth daily as needed for fluid or edema.    Marland Kitchen. guaiFENesin (MUCINEX) 600 MG 12 hr tablet Take 2  tablets (1,200 mg total) by mouth 2 (two) times daily as needed for cough or to loosen phlegm.    Marland Kitchen. LORazepam (ATIVAN) 0.5 MG tablet TAKE ONE TABLET BY MOUTH TWICE DAILY AS NEEDED FOR ANXIETY 60 tablet 5  . metoprolol tartrate (LOPRESSOR) 25 MG tablet TAKE ONE TABLET BY MOUTH TWICE DAILY 60 tablet 11  . omeprazole (PRILOSEC) 20 MG capsule TAKE ONE CAPSULE BY MOUTH ONCE DAILY 30 capsule 1   No current facility-administered medications for this visit.     Allergies  Penicillins; Alprazolam; Codeine; Tramadol; and Dulcolax [bisacodyl]  Electrocardiogram:  SR rate 59  ? Old IMI nonspecific lateral T wave changes  8/15  01/21/15  SR nonspecific lateral T wave changes  05/17/16  SR rate 70 low voltage   Assessment and Plan CAD/CABG  2013 Stable with no angina and good activity level.  Continue medical Rx Carotid: stable 60-79% bilateral disease f/u duplex 10/2016  Dizzyness: non cardiac f/u primary  Bradycardia:better on lower dose beta blocker  PAF: maint NSR no need for anticoagulation continue low dose beta blocker Sciatica:  Has a meeting with Matthew Bridges.  Previously seen by Matthew Bridges for ? Hip problem Gave him Matthew Bridges name if he needs pain Rx Anemia:  F/u primary looks pale and apparently has anemia  Matthew Bridges:  Suggested he go to Matthew Bridges for evaluation of right Matthew dupuytren's  Contracture   Matthew Bridges

## 2016-05-17 ENCOUNTER — Ambulatory Visit (INDEPENDENT_AMBULATORY_CARE_PROVIDER_SITE_OTHER): Payer: Medicare HMO | Admitting: Cardiovascular Disease

## 2016-05-17 ENCOUNTER — Encounter: Payer: Self-pay | Admitting: Cardiovascular Disease

## 2016-05-17 VITALS — BP 130/60 | HR 69 | Ht 70.0 in | Wt 209.8 lb

## 2016-05-17 DIAGNOSIS — I6523 Occlusion and stenosis of bilateral carotid arteries: Secondary | ICD-10-CM

## 2016-05-17 NOTE — Patient Instructions (Addendum)
Medication Instructions:  Your physician recommends that you continue on your current medications as directed. Please refer to the Current Medication list given to you today.  Labwork: NONE  Testing/Procedures: Your physician has requested that you have a carotid duplex in August. This test is an ultrasound of the carotid arteries in your neck. It looks at blood flow through these arteries that supply the brain with blood. Allow one hour for this exam. There are no restrictions or special instructions.  Follow-Up: Your physician wants you to follow-up in: 12 months with Dr. Eden EmmsNishan. You will receive a reminder letter in the mail two months in advance. If you don't receive a letter, please call our office to schedule the follow-up appointment.   If you need a refill on your cardiac medications before your next appointment, please call your pharmacy.

## 2016-06-05 DIAGNOSIS — R0602 Shortness of breath: Secondary | ICD-10-CM | POA: Insufficient documentation

## 2016-11-01 ENCOUNTER — Ambulatory Visit (HOSPITAL_COMMUNITY)
Admission: RE | Admit: 2016-11-01 | Discharge: 2016-11-01 | Disposition: A | Discharge status: Home / Self Care | Payer: Medicare HMO | Source: Ambulatory Visit | Attending: Cardiovascular Disease | Admitting: Cardiovascular Disease

## 2016-11-01 DIAGNOSIS — I6523 Occlusion and stenosis of bilateral carotid arteries: Secondary | ICD-10-CM | POA: Diagnosis not present

## 2017-03-23 DIAGNOSIS — I998 Other disorder of circulatory system: Secondary | ICD-10-CM

## 2017-03-23 DIAGNOSIS — I739 Peripheral vascular disease, unspecified: Secondary | ICD-10-CM

## 2017-03-23 DIAGNOSIS — M79673 Pain in unspecified foot: Secondary | ICD-10-CM | POA: Insufficient documentation

## 2017-03-23 HISTORY — DX: Other disorder of circulatory system: I99.8

## 2017-03-23 HISTORY — DX: Peripheral vascular disease, unspecified: I73.9

## 2017-03-23 HISTORY — DX: Pain in unspecified foot: M79.673

## 2017-07-19 DIAGNOSIS — H524 Presbyopia: Secondary | ICD-10-CM

## 2017-07-19 DIAGNOSIS — H52203 Unspecified astigmatism, bilateral: Secondary | ICD-10-CM

## 2017-07-19 DIAGNOSIS — H5203 Hypermetropia, bilateral: Secondary | ICD-10-CM | POA: Insufficient documentation

## 2017-07-19 HISTORY — DX: Presbyopia: H52.4

## 2017-07-19 HISTORY — DX: Hypermetropia, bilateral: H52.03

## 2017-08-21 DIAGNOSIS — R2689 Other abnormalities of gait and mobility: Secondary | ICD-10-CM | POA: Insufficient documentation

## 2017-08-21 DIAGNOSIS — H9202 Otalgia, left ear: Secondary | ICD-10-CM | POA: Insufficient documentation

## 2017-08-21 DIAGNOSIS — M2669 Other specified disorders of temporomandibular joint: Secondary | ICD-10-CM

## 2017-08-21 HISTORY — DX: Other abnormalities of gait and mobility: R26.89

## 2017-08-21 HISTORY — DX: Other specified disorders of temporomandibular joint: M26.69

## 2017-08-21 HISTORY — DX: Otalgia, left ear: H92.02

## 2017-10-11 ENCOUNTER — Other Ambulatory Visit: Payer: Self-pay | Admitting: Cardiovascular Disease

## 2017-10-11 DIAGNOSIS — I6523 Occlusion and stenosis of bilateral carotid arteries: Secondary | ICD-10-CM

## 2017-11-01 ENCOUNTER — Ambulatory Visit (HOSPITAL_COMMUNITY)
Admission: RE | Admit: 2017-11-01 | Discharge: 2017-11-01 | Disposition: A | Discharge status: Home / Self Care | Payer: Medicare HMO | Source: Ambulatory Visit | Attending: Cardiology | Admitting: Cardiology

## 2017-11-01 ENCOUNTER — Encounter: Payer: Self-pay | Admitting: Cardiology

## 2017-11-01 ENCOUNTER — Ambulatory Visit (INDEPENDENT_AMBULATORY_CARE_PROVIDER_SITE_OTHER): Payer: Medicare HMO | Admitting: Cardiology

## 2017-11-01 VITALS — BP 147/66 | HR 70 | Ht 70.0 in | Wt 202.2 lb

## 2017-11-01 DIAGNOSIS — I6523 Occlusion and stenosis of bilateral carotid arteries: Secondary | ICD-10-CM

## 2017-11-01 NOTE — Patient Instructions (Signed)
Medication Instructions: Your physician recommends that you continue on your current medications as directed.    If you need a refill on your cardiac medications before your next appointment, please call your pharmacy.   Labwork: None  Procedures/Testing: None  Follow-Up: Your physician wants you to follow-up in 2 weeks with Dr. Eden EmmsNishan or APP  Special Instructions:    [facility]

## 2017-11-01 NOTE — Progress Notes (Signed)
Cardiology Office Note:    Date:  11/01/2017   ID:  Matthew RamsayHubert Kahre, DOB 08-31-1930, MRN [id number]  PCP:  Sanjuana Kavaeeds, Ryan, PA  Cardiologist:  Dr. Eden EmmsNishan  CC: progressive carotid stenosis  History of Present Illness:    Matthew Bridges is a 84 y.o. male with a hx of CABG in 2013 and postoperative atrial fibrillation who is seen as an urgent visit given findings on his carotid exam today.  He presented for routine carotid artery ultrasounds. His prior exams had been stable. However, today his studies showed progression of his bilateral disease to 80-99% stenosis on the right side and around 80% stenosis on the left side. Patient and his wife wished to be seen today to discuss results.  His main concern is his chronic dizziness. This has been investigated by EP (Dr. Graciela HusbandsKlein), ENT, and neurology without clear cause. Per Dr. Fabio BeringNishan's last note, this may be due to his chronic lorazepam.  Patient denies recent falls. Had one episode of syncope years ago after his CABG; notes that prior to his CABG it was happening "all the time." Denies amaurosis fugax, has some peripheral blurring but no other recent vision changes. Reports chronic left arm weakness but no new focal neurologic symptoms.   No chest pain.  Past Medical History:  Diagnosis Date  . Alcohol abuse   . Anemia   . Anxiety   . Arthritis   . Asthmatic bronchitis   . B12 deficiency 01/01/2012  . Blood transfusion   . BPH (benign prostatic hyperplasia) 10/02/2011  . Bronchiectasis   . Chronic low back pain 10/02/2011  . COPD (chronic obstructive pulmonary disease) (HCC)    sees Dr/ Delford FieldWright, saw last Dec 2012  . Coronary artery disease    sees Dr. Eden EmmsNishan, saw 1 month ago  . Diverticulosis    Colonoscopy 2007/Dr. Leone PayorGessner  . Emotional lability 10/02/2011  . GERD (gastroesophageal reflux disease)   . H/O hiatal hernia   . Hx of colonic polyp    tubular adenoma (05/16/2009), hyperplastic polyp  . Hyperlipidemia   . Insomnia   .  Lumbar disc disease 10/02/2011  . Nephrolithiasis   . 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 Orthostatic hypotension 03/29/2012  . Pleurisy   . Pneumonia   . Pulmonary fibrosis (HCC) 07/10/2013  . S/P CABG (coronary artery bypass graft)   . Shortness of breath     Past Surgical History:  Procedure Laterality Date  . CARDIAC CATHETERIZATION     Friday 4, 2013  . CATARACT EXTRACTION     bilateral  . COLONOSCOPY     multiple  . CORONARY ARTERY BYPASS GRAFT  03/31/2011   Procedure: CORONARY ARTERY BYPASS GRAFTING (CABG);  Surgeon: Alleen BorneBryan K Bartle, MD;  [facility];  Service: Open Heart Surgery;  Laterality: N/A;  Times three using the mammary and right leg greater saphenous vein.   Marland Kitchen. KIDNEY STONE SURGERY     removal of kidney stone  . TONSILLECTOMY      Current Medications: Current Outpatient Medications on File Prior to Visit  Medication Sig  . aspirin 81 MG tablet Take 81 mg by mouth daily.  . Bisacodyl (LAXATIVE PO) Take 2 tablets by mouth daily.  . furosemide (LASIX) 20 MG tablet Take 20 mg by mouth daily as needed for fluid or edema.  Marland Kitchen. guaiFENesin (MUCINEX) 600 MG 12 hr tablet Take 2 tablets (1,200 mg total) by mouth 2 (two) times daily as needed for cough  or to loosen phlegm.  Marland Kitchen. LORazepam (ATIVAN) 0.5 MG tablet TAKE ONE TABLET BY MOUTH TWICE DAILY AS NEEDED FOR ANXIETY  . metoprolol tartrate (LOPRESSOR) 25 MG tablet TAKE ONE TABLET BY MOUTH TWICE DAILY  . omeprazole (PRILOSEC) 20 MG capsule TAKE ONE CAPSULE BY MOUTH ONCE DAILY   No current facility-administered medications on file prior to visit.      Allergies:   Penicillins; Alprazolam; Codeine; Tramadol; and Dulcolax [bisacodyl]   Social History   Socioeconomic History  . Marital status: Married    Spouse name: Not on file  . Number of children: 3  . Years of education: Not on file  . Highest education level: Not on file  Occupational History  . Occupation: Retired  Engineer, productionocial Needs  .  Financial resource strain: Not on file  . Food insecurity:    Worry: Not on file    Inability: Not on file  . Transportation needs:    Medical: Not on file    Non-medical: Not on file  Tobacco Use  . Smoking status: Former Smoker    Packs/day: 1.00    Years: 10.00    Pack years: 10.00    Types: Cigarettes    Last attempt to quit: 03/20/1956    Years since quitting: 61.6  . Smokeless tobacco: Former NeurosurgeonUser    Types: Snuff, Dorna BloomChew    Quit date: 03/20/1948  Substance and Sexual Activity  . Alcohol use: No  . Drug use: No  . Sexual activity: Not on file  Lifestyle  . Physical activity:    Days per week: Not on file    Minutes per session: Not on file  . Stress: Not on file  Relationships  . Social connections:    Talks on phone: Not on file    Gets together: Not on file    Attends religious service: Not on file    Active member of club or organization: Not on file    Attends meetings of clubs or organizations: Not on file    Relationship status: Not on file  Other Topics Concern  . Not on file  Social History Narrative  . Not on file     Family History: The patient's family history includes Alcohol abuse in his father; Breast cancer in his sister; Colon cancer in his brother and father; Heart disease in his mother and other; Lung cancer in his brother.  ROS:   Please see the history of present illness.  Additional pertinent ROS: Review of Systems  Constitutional: Positive for malaise/fatigue.  HENT: Negative for tinnitus.   Eyes: Positive for blurred vision.  Respiratory: Negative for shortness of breath.   Cardiovascular: Negative for chest pain, palpitations, orthopnea, claudication, leg swelling and PND.  Gastrointestinal: Negative for blood in stool and melena.  Genitourinary: Negative for hematuria.  Musculoskeletal: Positive for falls, joint pain and myalgias.       Distant falls  Skin:       Chronic bruising  Neurological: Positive for dizziness, tingling, tremors  and weakness. Negative for loss of consciousness.  Endo/Heme/Allergies: Bruises/bleeds easily.     EKGs/Labs/Other Studies Reviewed:    The following studies were reviewed today: Final Interpretation: Right Carotid: Velocities in the right ICA are consistent with a 80-99%        stenosis. The ECA appears >50% stenosed.  Left Carotid: Velocities in the left ICA are consistent with a high-end 60-79%       vs low-end 80-99% stenosis. The ECA appears >50% stenosed.  Vertebrals: Bilateral vertebral arteries demonstrate antegrade flow. Subclavians: Normal flow hemodynamics were seen in bilateral subclavian       arteries.  Vascular consult recommended. Patient was seen by Dr. Cristal Deer as same-day add-on per patient request.  Recent Labs: No results found for requested labs within last 8760 hours.  Recent Lipid Panel    Component Value Date/Time   CHOL 204 (H) 01/09/2013 1437   TRIG 116.0 01/09/2013 1437   HDL 53.20 01/09/2013 1437   CHOLHDL 4 01/09/2013 1437   VLDL 23.2 01/09/2013 1437   LDLDIRECT 140.9 01/09/2013 1437    Physical Exam:    VS:  BP (!) 147/66   Pulse 70   Ht 5\' 10"  (1.778 m)   Wt 202 lb 3.2 oz (91.7 kg)   SpO2 97%   BMI 29.01 kg/m     Wt Readings from Last 3 Encounters:  11/01/17 202 lb 3.2 oz (91.7 kg)  05/17/16 209 lb 12.8 oz (95.2 kg)  01/22/15 207 lb 1.9 oz (93.9 kg)     GEN: frail appearing, in no acute distress HEENT: Normal NECK: No JVD; bilateral soft carotid bruits LYMPHATICS: No lymphadenopathy CARDIAC: regular rhythm, normal S1 and S2, no murmurs, rubs, gallops. Radial and DP pulses 2+ bilaterally. RESPIRATORY:  Clear to auscultation without rales, wheezing or rhonchi  ABDOMEN: Soft, non-tender, non-distended MUSCULOSKELETAL:  No edema; right hand contracture SKIN: Warm and dry NEUROLOGIC:  Left arm strength 4+/5 at fingers, wrist, and elbow with fair effort. PSYCHIATRIC:  Normal affect   ASSESSMENT:     1. Carotid stenosis, asymptomatic, bilateral    PLAN:    1. Asymptomatic carotid stenosis: his nonspecific dizzyness is unchanged and has been present for many years, without clear cause. Denies focal neuro changes or amaurosis, no syncope. No urgent hospitalization required, but will need close follow up to discuss next steps.   Plan for follow up: with Dr. Eden Emms or APP soonest available. Would consider discussing w/Dr. Allyson Sabal if carotid stenting appropriate vs. Referral to vascular surgery for CEA.  Medication Adjustments/Labs and Tests Ordered: Current medicines are reviewed at length with the patient today.  Concerns regarding medicines are outlined above.  No orders of the defined types were placed in this encounter.  No orders of the defined types were placed in this encounter.   Patient Instructions  Medication Instructions: Your physician recommends that you continue on your current medications as directed.    If you need a refill on your cardiac medications before your next appointment, please call your pharmacy.   Labwork: None  Procedures/Testing: None  Follow-Up: Your physician wants you to follow-up in 2 weeks with Dr. Eden Emms or APP  Special Instructions:    Thank you for choosing [facility] at [facility]!!       Signed, Jodelle Red, MD PhD 11/01/2017 5:17 PM    [facility]

## 2017-11-21 ENCOUNTER — Encounter: Payer: Self-pay | Admitting: Cardiology

## 2017-11-21 NOTE — Progress Notes (Addendum)
Cardiology Office Note   Date:  11/22/2017   ID:  Matthew Bridges, DOB 10-Jan-1931, MRN [id number]  PCP:  Sanjuana Kava, PA  Cardiologist:  Dr. Eden Emms     Chief Complaint  Patient presents with  . Coronary Artery Disease    but here for carotid disease      History of Present Illness: Devinn Bridges is a 84 y.o. male who presents for CAD and carotid disease.    Pt with hx of CABG in 2013 and postoperative atrial fibrillation who is seen as an urgent visit given findings on his carotid exam today.  He presented for routine carotid artery ultrasounds. His prior exams had been stable. However, today his studies showed progression of his bilateral disease to 80-99% stenosis on the right side and around 80% stenosis on the left side. Patient and his wife wished to be seen today to discuss results.  His main concern is his chronic dizziness. This has been investigated by EP (Dr. Graciela Husbands), ENT, and neurology without clear cause. Per Dr. Fabio Bering last note, this may be due to his chronic lorazepam.  Patient denied recent falls. Had one episode of syncope years ago after his CABG; notes that prior to his CABG it was happening "all the time." Denies amaurosis fugax, has some peripheral blurring but no other recent vision changes. Reports chronic left arm weakness but no new focal neurologic symptoms.   Today pt complains of DOE that has increased no wheezing and occ chest pain with this.  He would come in and sit for 10 min and dyspnea resolves.  No dyspnea at rest.  He does have asthma and has not seen pulmonary in some time.  He does have lower ext edema that goes down at night , only in Lt leg.  He does not like taking lasix. We discussed decreasing salt and wearing support stockings, just starting with those  His daughter in law that is with him notes he has been off balance more with falls but no injuries.    They are all anxious to have this fixed.    Past Medical History:  Diagnosis  Date  . Alcohol abuse   . Anemia   . Anxiety   . Arthritis   . Asthmatic bronchitis   . B12 deficiency 01/01/2012  . Blood transfusion   . BPH (benign prostatic hyperplasia) 10/02/2011  . Bronchiectasis   . Chronic low back pain 10/02/2011  . COPD (chronic obstructive pulmonary disease) (HCC)    sees Dr/ Delford Field, saw last Dec 2012  . Coronary artery disease    sees Dr. Eden Emms, saw 1 month ago  . Diverticulosis    Colonoscopy 2007/Dr. Leone Payor  . Emotional lability 10/02/2011  . GERD (gastroesophageal reflux disease)   . H/O hiatal hernia   . Hx of colonic polyp    tubular adenoma (05/16/2009), hyperplastic polyp  . Hyperlipidemia   . Insomnia   . Lumbar disc disease 10/02/2011  . Nephrolithiasis   . 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 Orthostatic hypotension 03/29/2012  . Pleurisy   . Pneumonia   . Pulmonary fibrosis (HCC) 07/10/2013  . S/P CABG (coronary artery bypass graft)   . Shortness of breath     Past Surgical History:  Procedure Laterality Date  . CARDIAC CATHETERIZATION     Friday 4, 2013  . CATARACT EXTRACTION     bilateral  . COLONOSCOPY     multiple  . CORONARY ARTERY BYPASS  GRAFT  03/31/2011   Procedure: CORONARY ARTERY BYPASS GRAFTING (CABG);  Surgeon: [name], MD;  Location[facility] OR;  Service: Open Heart Surgery;  Laterality: N/A;  Times three using the mammary and right leg greater saphenous vein.   Marland Kitchen KIDNEY STONE SURGERY     removal of kidney stone  . TONSILLECTOMY       Current Outpatient Medications  Medication Sig Dispense Refill  . aspirin 81 MG tablet Take 81 mg by mouth daily.    . Bisacodyl (LAXATIVE PO) Take 2 tablets by mouth daily.    . furosemide (LASIX) 20 MG tablet Take 20 mg by mouth daily as needed for fluid or edema.    Marland Kitchen guaiFENesin (MUCINEX) 600 MG 12 hr tablet Take 2 tablets (1,200 mg total) by mouth 2 (two) times daily as needed for cough or to loosen phlegm.    Marland Kitchen LORazepam (ATIVAN) 0.5 MG tablet TAKE  ONE TABLET BY MOUTH TWICE DAILY AS NEEDED FOR ANXIETY 60 tablet 5  . metoprolol tartrate (LOPRESSOR) 25 MG tablet TAKE ONE TABLET BY MOUTH TWICE DAILY 60 tablet 11  . omeprazole (PRILOSEC) 20 MG capsule TAKE ONE CAPSULE BY MOUTH ONCE DAILY 30 capsule 1  . albuterol (PROVENTIL HFA;VENTOLIN HFA) 108 (90 Base) MCG/ACT inhaler Inhale 2 puffs into the lungs every 6 (six) hours as needed for wheezing or shortness of breath. 1 Inhaler 0   No current facility-administered medications for this visit.     Allergies:   Penicillins; Alprazolam; Codeine; Tramadol; and Dulcolax [bisacodyl]    Social History:  The patient  reports that he quit smoking about 61 years ago. His smoking use included cigarettes. He has a 10.00 pack-year smoking history. He quit smokeless tobacco use about 69 years ago.  His smokeless tobacco use included snuff and chew. He reports that he does not drink alcohol or use drugs.   Family History:  The patient's family history includes Alcohol abuse in his father; Breast cancer in his sister; Colon cancer in his brother and father; Heart disease in his mother and other; Lung cancer in his brother.    ROS:  General:no colds or fevers, no weight changes Skin:no rashes or ulcers HEENT:no blurred vision, no congestion CV:see HPI PUL:see HPI GI:no diarrhea constipation or melena, no indigestion GU:no hematuria, no dysuria MS:no joint pain, no claudication Neuro:no syncope, no lightheadedness some dizziness and increased falls. Endo:no diabetes, no thyroid disease  Wt Readings from Last 3 Encounters:  11/22/17 201 lb (91.2 kg)  11/01/17 202 lb 3.2 oz (91.7 kg)  05/17/16 209 lb 12.8 oz (95.2 kg)     PHYSICAL EXAM: VS:  BP 124/64   Pulse 67   Ht 5\' 10"  (1.778 m)   Wt 201 lb (91.2 kg)   BMI 28.84 kg/m  , BMI Body mass index is 28.84 kg/m. General:Pleasant affect, NAD Skin:Warm and dry, brisk capillary refill HEENT:normocephalic, sclera clear, mucus membranes  moist Neck:supple, no JVD, soft bilateral bruits  Heart:S1S2 RRR without murmur, gallup, rub or click Lungs:clear without rales, rhonchi, or wheezes ZOX:WRUE, non tender, + BS, do not palpate liver spleen or masses Ext:tr  lower ext edema, 1+ pedal pulses, 2+ radial pulses Neuro:alert and oriented X 3, MAE, follows commands, + facial symmetry    EKG:  EKG is ordered today. The ekg ordered today demonstrates SR at 67, minimal criteria for LVH, Q waves in inf leads.    Recent Labs: No results found for requested labs within last 8760 hours.  Lipid Panel    Component Value Date/Time   CHOL 204 (H) 01/09/2013 1437   TRIG 116.0 01/09/2013 1437   HDL 53.20 01/09/2013 1437   CHOLHDL 4 01/09/2013 1437   VLDL 23.2 01/09/2013 1437   LDLDIRECT 140.9 01/09/2013 1437       Other studies Reviewed: Additional studies/ records that were reviewed today include: .  Carotid dopplers 11/01/17  Final Interpretation: Right Carotid: Velocities in the right ICA are consistent with a 80-99%        stenosis. The ECA appears >50% stenosed.  Left Carotid: Velocities in the left ICA are consistent with a high-end 60-79%       vs low-end 80-99% stenosis. The ECA appears >50% stenosed.  Vertebrals: Bilateral vertebral arteries demonstrate antegrade flow. Subclavians: Normal flow hemodynamics were seen in bilateral subclavian       arteries.  Vascular consult recommended. Patient was seen by Dr. Cristal Deer as same-day add-on per patient request.  Echo 12/12/12 Study Conclusions  - Left ventricle: The cavity size was normal. Wall thickness was increased in a pattern of mild LVH. Systolic function was normal. The estimated ejection fraction was in the range of 55% to 65%. Wall motion was normal; there were no regional wall motion abnormalities. Doppler parameters are consistent with abnormal left ventricular relaxation (grade 1 diastolic dysfunction). -  Atrial septum: No defect or patent foramen ovale was identified.  ASSESSMENT AND PLAN:  1. Carotid stenosis bil has increased since last dopplers, he believes his dizziness is somewhat worse.  He has had more falls.  Discussed with Dr. Eden Emms and will send to vascular surgery.     2.  CAD with hx of CABG in 2013.  Now with occ chest pain but more DOE.  He has hx of asthma.  Will give albuterol inhaler and will do lexiscan myoview. To rule out ischemia.  3.  PAF maintaining SR.  No need for anticoagulation.  4.  Hx of asthma , albuterol inhaler ordered will follow with PCP.   5.   HLD not on statin.  Will add.  lipitor 40 mg.   Current medicines are reviewed with the patient today.  The patient Has no concerns regarding medicines.  The following changes have been made:  See above Labs/ tests ordered today include:see above  Disposition:   FU:  see above  Signed, Nada Boozer, NP  11/22/2017 1:22 PM    [facility] [address] [place], [place], [place]  [address]/ [address] [place], [place] Phone: [phone]; Fax: [phone]  [phone]

## 2017-11-22 ENCOUNTER — Ambulatory Visit (INDEPENDENT_AMBULATORY_CARE_PROVIDER_SITE_OTHER): Payer: Medicare HMO | Admitting: Cardiology

## 2017-11-22 ENCOUNTER — Other Ambulatory Visit: Payer: Self-pay

## 2017-11-22 ENCOUNTER — Encounter: Payer: Self-pay | Admitting: *Deleted

## 2017-11-22 ENCOUNTER — Encounter: Payer: Self-pay | Admitting: Cardiology

## 2017-11-22 ENCOUNTER — Telehealth (HOSPITAL_COMMUNITY): Payer: Self-pay | Admitting: *Deleted

## 2017-11-22 VITALS — BP 124/64 | HR 67 | Ht 70.0 in | Wt 201.0 lb

## 2017-11-22 DIAGNOSIS — R0609 Other forms of dyspnea: Secondary | ICD-10-CM | POA: Diagnosis not present

## 2017-11-22 DIAGNOSIS — I2510 Atherosclerotic heart disease of native coronary artery without angina pectoris: Secondary | ICD-10-CM

## 2017-11-22 DIAGNOSIS — E782 Mixed hyperlipidemia: Secondary | ICD-10-CM | POA: Diagnosis not present

## 2017-11-22 DIAGNOSIS — I779 Disorder of arteries and arterioles, unspecified: Secondary | ICD-10-CM | POA: Diagnosis not present

## 2017-11-22 DIAGNOSIS — I739 Peripheral vascular disease, unspecified: Principal | ICD-10-CM

## 2017-11-22 MED ORDER — ALBUTEROL SULFATE HFA 108 (90 BASE) MCG/ACT IN AERS: 2.0000 | INHALATION_SPRAY | Freq: Four times a day (QID) | RESPIRATORY_TRACT | 0 refills | Status: DC | PRN

## 2017-11-22 NOTE — Patient Instructions (Addendum)
Medication Instructions:  1. A PRESCRIPTION FOR ALBUTEROL INHALER; FURTHER REFILLS WITH PRIMARY CARE   Labwork: NONE ORDERED TODAY  Testing/Procedures: Your physician has requested that you have a lexiscan myoview. For further information please visit [email or web address] Please follow instruction sheet, as given. PER PT REQUEST THIS APPT NEEDS TO BE EARLY IN THE MORNING   Follow-Up: 1. DR. Eden Emms IN 6 WEEKS   2. YOU ARE BEING REFERRED TO A VASCULAR SURGEON (DR. FIELDS) ASAP FOR CAROTID DISEASE; THEIR OFFICE WILL CALL YOU WITH AN APPT.  PER PT REQUEST THIS APPT NEEDS TO BE AFTER 2 PM   Any Other Special Instructions Will Be Listed Below (If Applicable).     If you need a refill on your cardiac medications before your next appointment, please call your pharmacy.

## 2017-11-22 NOTE — Telephone Encounter (Signed)
Left message on voicemail per DPR in reference to upcoming appointment scheduled on 11/26/17 with detailed instructions given per Myocardial Perfusion Study Information Sheet for the test. LM to arrive 15 minutes early, and that it is imperative to arrive on time for appointment to keep from having the test rescheduled. If you need to cancel or reschedule your appointment, please call the office within 24 hours of your appointment. Failure to do so may result in a cancellation of your appointment, and a $50 no show fee. Phone number given for call back for any questions. Meaghann Choo Jacqueline    

## 2017-11-23 ENCOUNTER — Telehealth: Payer: Self-pay | Admitting: *Deleted

## 2017-11-23 MED ORDER — ALBUTEROL SULFATE HFA 108 (90 BASE) MCG/ACT IN AERS: 2.0000 | INHALATION_SPRAY | Freq: Four times a day (QID) | RESPIRATORY_TRACT | 0 refills | Status: AC | PRN

## 2017-11-23 MED ORDER — ATORVASTATIN CALCIUM 20 MG PO TABS: 20.0000 mg | ORAL_TABLET | Freq: Every day | ORAL | 3 refills | Status: DC

## 2017-11-23 NOTE — Telephone Encounter (Signed)
DPR ok to s/w pt's wife. Per Nada Boozer, NP I called and s/w pt's wife and went over recommendation to have pt start Lipitor 20 mg daily. I also let her know per Nada Boozer, NP in regards to the [facility] appt, she s/w Dr. Eden Emms that pt was scheduled for 12/20/17 to see Dr. Darrick Penna. The referral was placed as an URGENT per Nada Boozer, NP. NP states Dr. Eden Emms will s/w the doctors at [facility] to get a sooner appt. Pt's wife was very thankful for this information and all of our help. Rx has been sent to [facility] in [place] on [address]. Pt's wife did ask for me to take the [facility] on [address] out of the chart.

## 2017-11-26 ENCOUNTER — Ambulatory Visit (HOSPITAL_COMMUNITY): Payer: Medicare HMO | Attending: Internal Medicine

## 2017-11-26 DIAGNOSIS — R0609 Other forms of dyspnea: Secondary | ICD-10-CM | POA: Diagnosis not present

## 2017-11-26 LAB — MYOCARDIAL PERFUSION IMAGING
LVDIAVOL: 87 mL (ref 62–150)
LVSYSVOL: 38 mL
Peak HR: 87 {beats}/min
RATE: 0.39
Rest HR: 64 {beats}/min
SDS: 2
SRS: 4
SSS: 6
TID: 1.12

## 2017-11-26 MED ORDER — TECHNETIUM TC 99M TETROFOSMIN IV KIT
31.8000 | PACK | Freq: Once | INTRAVENOUS | Status: AC | PRN
  Administered 2017-11-26: 31.8 via INTRAVENOUS
  Filled 2017-11-26: qty 32

## 2017-11-26 MED ORDER — TECHNETIUM TC 99M TETROFOSMIN IV KIT
10.2000 | PACK | Freq: Once | INTRAVENOUS | Status: AC | PRN
  Administered 2017-11-26: 10.2 via INTRAVENOUS
  Filled 2017-11-26: qty 11

## 2017-11-26 MED ORDER — REGADENOSON 0.4 MG/5ML IV SOLN
0.4000 mg | Freq: Once | INTRAVENOUS | Status: AC
  Administered 2017-11-26: 0.4 mg via INTRAVENOUS

## 2017-11-27 ENCOUNTER — Encounter: Payer: Self-pay | Admitting: Vascular Surgery

## 2017-11-27 ENCOUNTER — Ambulatory Visit: Payer: Medicare HMO | Admitting: Vascular Surgery

## 2017-11-27 ENCOUNTER — Other Ambulatory Visit: Payer: Self-pay

## 2017-11-27 ENCOUNTER — Other Ambulatory Visit: Payer: Self-pay | Admitting: Vascular Surgery

## 2017-11-27 VITALS — BP 158/76 | HR 68 | Resp 20 | Ht 70.0 in | Wt 200.6 lb

## 2017-11-27 DIAGNOSIS — I6523 Occlusion and stenosis of bilateral carotid arteries: Secondary | ICD-10-CM | POA: Diagnosis not present

## 2017-11-27 NOTE — Progress Notes (Signed)
Patient name: Matthew Bridges MRN: [id number] DOB: 01-11-1931 Sex: male  REASON FOR CONSULT: Bilateral carotid stenosis  HPI: Matthew Bridges is a 84 y.o. male with multiple medical comorbidities including coronary artery disease status post CABG in 2013, COPD, hyperlipidemia that presents for evaluation of bilateral carotid stenosis.  Patient states he has been under surveillance for years for carotid disease.  On most recent duplex was noted to have a right ICA velocity of 671/325 as well as a left ICA velocity of 462/126 both consistent with high-grade stenosis and was referred here.  Family states he had a stroke in 66.  Since then he describes no unilateral vision loss.  He does have some bilateral blurry vision.  Seems he is quite frail and walks with a walker.  States he is most concerned about his dizziness.  He does endorse some chronic numbness to his left hand that waxes and wanes.  In talking with his wife and daughter they state this has been present for years and does not appear to be any different than baseline (has seen hand surgeon in past as well).  He is currently on aspirin as well as a statin.  Past Medical History:  Diagnosis Date  . Alcohol abuse   . Anemia   . Anxiety   . Arthritis   . Asthmatic bronchitis   . B12 deficiency 01/01/2012  . Blood transfusion   . BPH (benign prostatic hyperplasia) 10/02/2011  . Bronchiectasis   . Carotid artery occlusion   . Chronic low back pain 10/02/2011  . COPD (chronic obstructive pulmonary disease) (HCC)    sees Dr/ Delford Field, saw last Dec 2012  . Coronary artery disease    sees Dr. Eden Emms, saw 1 month ago  . Diverticulosis    Colonoscopy 2007/Dr. Leone Payor  . Emotional lability 10/02/2011  . GERD (gastroesophageal reflux disease)   . H/O hiatal hernia   . Hx of colonic polyp    tubular adenoma (05/16/2009), hyperplastic polyp  . Hyperlipidemia   . Insomnia   . Lumbar disc disease 10/02/2011  . Nephrolithiasis   .  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 Orthostatic hypotension 03/29/2012  . Pleurisy   . Pneumonia   . Pulmonary fibrosis (HCC) 07/10/2013  . S/P CABG (coronary artery bypass graft)   . Shortness of breath     Past Surgical History:  Procedure Laterality Date  . CARDIAC CATHETERIZATION     Friday 4, 2013  . CATARACT EXTRACTION     bilateral  . COLONOSCOPY     multiple  . CORONARY ARTERY BYPASS GRAFT  03/31/2011   Procedure: CORONARY ARTERY BYPASS GRAFTING (CABG);  Surgeon: [name], MD;  Location: [facility] OR;  Service: Open Heart Surgery;  Laterality: N/A;  Times three using the mammary and right leg greater saphenous vein.   Marland Kitchen KIDNEY STONE SURGERY     removal of kidney stone  . TONSILLECTOMY      Family History  Problem Relation Age of Onset  . Heart disease Mother   . Colon cancer Father   . Alcohol abuse Father   . Heart disease Other        maternal side   . Colon cancer Brother   . Lung cancer Brother   . Breast cancer Sister   . Heart disease Sister     SOCIAL HISTORY: Social History   Socioeconomic History  . Marital status: Married    Spouse name: Not on  file  . Number of children: 3  . Years of education: Not on file  . Highest education level: Not on file  Occupational History  . Occupation: Retired  Engineer, production  . Financial resource strain: Not on file  . Food insecurity:    Worry: Not on file    Inability: Not on file  . Transportation needs:    Medical: Not on file    Non-medical: Not on file  Tobacco Use  . Smoking status: Former Smoker    Packs/day: 1.00    Years: 10.00    Pack years: 10.00    Types: Cigarettes    Last attempt to quit: 03/20/1956    Years since quitting: 61.7  . Smokeless tobacco: Former Neurosurgeon    Types: Snuff, Dorna Bloom    Quit date: 03/20/1948  Substance and Sexual Activity  . Alcohol use: No  . Drug use: No  . Sexual activity: Not on file  Lifestyle  . Physical activity:    Days per week: Not on  file    Minutes per session: Not on file  . Stress: Not on file  Relationships  . Social connections:    Talks on phone: Not on file    Gets together: Not on file    Attends religious service: Not on file    Active member of club or organization: Not on file    Attends meetings of clubs or organizations: Not on file    Relationship status: Not on file  . Intimate partner violence:    Fear of current or ex partner: Not on file    Emotionally abused: Not on file    Physically abused: Not on file    Forced sexual activity: Not on file  Other Topics Concern  . Not on file  Social History Narrative  . Not on file    Allergies  Allergen Reactions  . Penicillins Hives and Rash  . Alprazolam Other (See Comments)    Confusion, weakness Confusion, weakness  . Codeine Other (See Comments)    Makes him feel dizzy, out of it Makes him feel dizzy, out of it Makes him feel dizzy, out of it  . Tramadol     Zombie state Zombie state  . Dulcolax [Bisacodyl] Nausea And Vomiting    Makes stomach hurt Makes stomach hurt Makes stomach hurt    Current Outpatient Medications  Medication Sig Dispense Refill  . albuterol (PROVENTIL HFA;VENTOLIN HFA) 108 (90 Base) MCG/ACT inhaler Inhale 2 puffs into the lungs every 6 (six) hours as needed for wheezing or shortness of breath. 1 Inhaler 0  . aspirin 81 MG tablet Take 81 mg by mouth daily.    Marland Kitchen atorvastatin (LIPITOR) 20 MG tablet Take 1 tablet (20 mg total) by mouth daily. 90 tablet 3  . Bisacodyl (LAXATIVE PO) Take 2 tablets by mouth daily.    . furosemide (LASIX) 20 MG tablet Take 20 mg by mouth daily as needed for fluid or edema.    Marland Kitchen guaiFENesin (MUCINEX) 600 MG 12 hr tablet Take 2 tablets (1,200 mg total) by mouth 2 (two) times daily as needed for cough or to loosen phlegm.    Marland Kitchen LORazepam (ATIVAN) 0.5 MG tablet TAKE ONE TABLET BY MOUTH TWICE DAILY AS NEEDED FOR ANXIETY 60 tablet 5  . metoprolol tartrate (LOPRESSOR) 25 MG tablet TAKE ONE  TABLET BY MOUTH TWICE DAILY 60 tablet 11  . omeprazole (PRILOSEC) 20 MG capsule TAKE ONE CAPSULE BY MOUTH ONCE DAILY 30 capsule  1   No current facility-administered medications for this visit.     REVIEW OF SYSTEMS:  [X]  denotes positive finding, [ ]  denotes negative finding Cardiac  Comments:  Chest pain or chest pressure:    Shortness of breath upon exertion:    Short of breath when lying flat:    Irregular heart rhythm:        Vascular    Pain in calf, thigh, or hip brought on by ambulation:    Pain in feet at night that wakes you up from your sleep:     Blood clot in your veins:    Leg swelling:         Pulmonary    Oxygen at home:    Productive cough:     Wheezing:         Neurologic    Sudden weakness in arms or legs:     Sudden numbness in arms or legs:     Sudden onset of difficulty speaking or slurred speech:    Temporary loss of vision in one eye:     Problems with dizziness:         Gastrointestinal    Blood in stool:     Vomited blood:         Genitourinary    Burning when urinating:     Blood in urine:        Psychiatric    Major depression:         Hematologic    Bleeding problems:    Problems with blood clotting too easily:        Skin    Rashes or ulcers:        Constitutional    Fever or chills:      PHYSICAL EXAM: Vitals:   11/27/17 1100 11/27/17 1102  BP: (!) 157/70 (!) 158/76  Pulse: 68   Resp: 20   SpO2: 99%   Weight: 91 kg   Height: 5\' 10"  (1.778 m)     GENERAL: The patient is a well-nourished male, in no acute distress. The vital signs are documented above. CARDIAC: There is a regular rate and rhythm.  VASCULAR: 2+ radial pulse palpable bilateral upper extremity 2+ palpable femoral pulse bilateral groins PULMONARY: No respiratory distress. ABDOMEN: Soft and non-tender with normal pitched bowel sounds.  MUSCULOSKELETAL: There are no major deformities or cyanosis. NEUROLOGIC: No focal weakness or paresthesias are detected.   CN II-XII grossly intact.  Appears to have intact motor and chronic numbness in left hand.   SKIN: There are no ulcers or rashes noted. PSYCHIATRIC: The patient has a normal affect.  DATA:   I independently reviewed noninvasive imaging that shows a right ICA velocity of 671/325 as well as a left ICA velocity of 462/126 both consistent with high-grade stenosis greater than 80%.  He has antegrade flow of both vertebral arteries.  Assessment/Plan:  84 year old male who presents with bilateral high-grade ICA stenosis.  His right ICA velocity is significantly more elevated than the left.  For now I am calling him asymptomatic bilateral high-grade stenosis.  He does have some chronic numbness in his left hand that has been waxing and waning for years and to the best of my ability I cannot tell that the symptoms are any different from his baseline.  He is quite frail and walks with a walker.  He underwent a stress test yesterday for chest pain and shortness of breath when he walks with results pending.  Discussed that we  would normally recommend intervention for greater than 80% carotid stenosis regardless of whether it is asymptomatic or a symptomatically lesion.  For now I recommended a CTA neck to see if he would be a candidate for TCAR (carotid stent) given his medical co-morbidities and age.  He would qualify given age >55, contralateral carotid disease, and cardiac history pending his anatomy on the scan.  I will contact the patient's son Matthew Bridges at [phone] once we have the results to make a decision moving forward.  I would plan to repair the right side first later this month.     [name], MD [facility] Office: [phone] Pager: [phone]    [name]

## 2017-12-04 ENCOUNTER — Ambulatory Visit
Admission: RE | Admit: 2017-12-04 | Discharge: 2017-12-04 | Disposition: A | Discharge status: Home / Self Care | Payer: Medicare HMO | Source: Ambulatory Visit | Attending: Vascular Surgery | Admitting: Vascular Surgery

## 2017-12-04 DIAGNOSIS — I6523 Occlusion and stenosis of bilateral carotid arteries: Secondary | ICD-10-CM

## 2017-12-04 MED ORDER — IOPAMIDOL (ISOVUE-370) INJECTION 76%
75.0000 mL | Freq: Once | INTRAVENOUS | Status: AC | PRN
  Administered 2017-12-04: 75 mL via INTRAVENOUS

## 2017-12-05 ENCOUNTER — Other Ambulatory Visit: Payer: Self-pay | Admitting: *Deleted

## 2017-12-05 ENCOUNTER — Other Ambulatory Visit: Payer: Self-pay | Admitting: Family Medicine

## 2017-12-05 DIAGNOSIS — I6523 Occlusion and stenosis of bilateral carotid arteries: Secondary | ICD-10-CM

## 2017-12-05 MED ORDER — CLOPIDOGREL BISULFATE 75 MG PO TABS: 75.0000 mg | ORAL_TABLET | Freq: Every day | ORAL | 11 refills | Status: DC

## 2017-12-05 NOTE — Progress Notes (Signed)
Plavix Rx sent to [id number] in [place], [place] as per TCAR protocol approved by Dr. Chestine Sporelark

## 2017-12-12 ENCOUNTER — Telehealth: Payer: Self-pay | Admitting: Vascular Surgery

## 2017-12-12 NOTE — Telephone Encounter (Signed)
Called and spoke with Matthew Bridges and his wife this morning.  We have made plans to move forward with a right TCAR tomorrow given a high-grade right carotid stenosis and his anatomy is amendable to a TCAR procedure after review of CTA.  Patient has been taking his Plavix for the last week as well as his aspirin and statin - which he should continue.  He said no new symptoms in the interim.  His Lexiscan was negative for any inducible ischemia.  Sounds like since taking some albuterol inhalers his breathing has improved since I saw him in clinic.  I offered to do this case awake in the OR given his age and risk for any anesthetic but unfortunately he does not feel that he can tolerate being awake for the procedure.  He understands the risks of a general anesthetic and wants to move forward.  [name], MD [facility] [place] Office: [phone] Pager: [phone]  [name]

## 2017-12-12 NOTE — Progress Notes (Signed)
Several unsuccessful attempts were made to contact pt. Pt son Orvilla Fus provided with pre-op instructions only. Please complete assessment on DOS. Tommy made aware to have pt stop taking vitamins, fish oil,  herbal medications. Do not take any NSAIDs ie: Ibuprofen, Advil, Naproxen (Aleve), Motrin, BC and Goody Powder. Tommy verbalized understanding of all pre-op instructions.

## 2017-12-13 ENCOUNTER — Encounter (HOSPITAL_COMMUNITY): Payer: Self-pay

## 2017-12-13 ENCOUNTER — Inpatient Hospital Stay (HOSPITAL_COMMUNITY): Payer: Medicare HMO | Admitting: Physician Assistant

## 2017-12-13 ENCOUNTER — Encounter (HOSPITAL_COMMUNITY)
Admission: RE | Disposition: A | Discharge status: Home Health | Payer: Self-pay | Source: Home / Self Care | Attending: Vascular Surgery

## 2017-12-13 ENCOUNTER — Other Ambulatory Visit: Payer: Self-pay

## 2017-12-13 ENCOUNTER — Inpatient Hospital Stay (HOSPITAL_COMMUNITY)
Admission: RE | Admit: 2017-12-13 | Discharge: 2017-12-14 | DRG: 036 | Disposition: A | Discharge status: Home Health | Payer: Medicare HMO | Attending: Vascular Surgery | Admitting: Vascular Surgery

## 2017-12-13 DIAGNOSIS — J449 Chronic obstructive pulmonary disease, unspecified: Secondary | ICD-10-CM | POA: Diagnosis present

## 2017-12-13 DIAGNOSIS — R54 Age-related physical debility: Secondary | ICD-10-CM | POA: Diagnosis present

## 2017-12-13 DIAGNOSIS — Z951 Presence of aortocoronary bypass graft: Secondary | ICD-10-CM | POA: Diagnosis not present

## 2017-12-13 DIAGNOSIS — Z885 Allergy status to narcotic agent status: Secondary | ICD-10-CM | POA: Diagnosis not present

## 2017-12-13 DIAGNOSIS — Z880 Allergy status to penicillin: Secondary | ICD-10-CM | POA: Diagnosis not present

## 2017-12-13 DIAGNOSIS — Z87891 Personal history of nicotine dependence: Secondary | ICD-10-CM | POA: Diagnosis not present

## 2017-12-13 DIAGNOSIS — Z7982 Long term (current) use of aspirin: Secondary | ICD-10-CM | POA: Diagnosis not present

## 2017-12-13 DIAGNOSIS — E785 Hyperlipidemia, unspecified: Secondary | ICD-10-CM | POA: Diagnosis present

## 2017-12-13 DIAGNOSIS — Z888 Allergy status to other drugs, medicaments and biological substances status: Secondary | ICD-10-CM

## 2017-12-13 DIAGNOSIS — I6523 Occlusion and stenosis of bilateral carotid arteries: Secondary | ICD-10-CM | POA: Diagnosis present

## 2017-12-13 DIAGNOSIS — M1990 Unspecified osteoarthritis, unspecified site: Secondary | ICD-10-CM | POA: Diagnosis present

## 2017-12-13 DIAGNOSIS — K219 Gastro-esophageal reflux disease without esophagitis: Secondary | ICD-10-CM | POA: Diagnosis present

## 2017-12-13 DIAGNOSIS — Z87442 Personal history of urinary calculi: Secondary | ICD-10-CM | POA: Diagnosis not present

## 2017-12-13 DIAGNOSIS — I6529 Occlusion and stenosis of unspecified carotid artery: Secondary | ICD-10-CM | POA: Diagnosis present

## 2017-12-13 DIAGNOSIS — Z8673 Personal history of transient ischemic attack (TIA), and cerebral infarction without residual deficits: Secondary | ICD-10-CM | POA: Diagnosis not present

## 2017-12-13 DIAGNOSIS — I2510 Atherosclerotic heart disease of native coronary artery without angina pectoris: Secondary | ICD-10-CM | POA: Diagnosis present

## 2017-12-13 DIAGNOSIS — J8410 Pulmonary fibrosis, unspecified: Secondary | ICD-10-CM | POA: Diagnosis present

## 2017-12-13 DIAGNOSIS — M545 Low back pain: Secondary | ICD-10-CM | POA: Diagnosis present

## 2017-12-13 DIAGNOSIS — Z86010 Personal history of colonic polyps: Secondary | ICD-10-CM

## 2017-12-13 DIAGNOSIS — Z79899 Other long term (current) drug therapy: Secondary | ICD-10-CM

## 2017-12-13 DIAGNOSIS — N400 Enlarged prostate without lower urinary tract symptoms: Secondary | ICD-10-CM | POA: Diagnosis present

## 2017-12-13 DIAGNOSIS — Z8249 Family history of ischemic heart disease and other diseases of the circulatory system: Secondary | ICD-10-CM | POA: Diagnosis not present

## 2017-12-13 DIAGNOSIS — I6521 Occlusion and stenosis of right carotid artery: Secondary | ICD-10-CM

## 2017-12-13 DIAGNOSIS — F419 Anxiety disorder, unspecified: Secondary | ICD-10-CM | POA: Diagnosis present

## 2017-12-13 DIAGNOSIS — H538 Other visual disturbances: Secondary | ICD-10-CM | POA: Diagnosis present

## 2017-12-13 DIAGNOSIS — G8929 Other chronic pain: Secondary | ICD-10-CM | POA: Diagnosis present

## 2017-12-13 HISTORY — PX: TRANSCAROTID ARTERY REVASCULARIZATIONA: SHX6778

## 2017-12-13 HISTORY — DX: Occlusion and stenosis of unspecified carotid artery: I65.29

## 2017-12-13 LAB — CBC
HEMATOCRIT: 35 % — AB (ref 39.0–52.0)
Hemoglobin: 11.6 g/dL — ABNORMAL LOW (ref 13.0–17.0)
MCH: 35 pg — ABNORMAL HIGH (ref 26.0–34.0)
MCHC: 33.1 g/dL (ref 30.0–36.0)
MCV: 105.7 fL — AB (ref 78.0–100.0)
Platelets: 100 10*3/uL — ABNORMAL LOW (ref 150–400)
RBC: 3.31 MIL/uL — AB (ref 4.22–5.81)
RDW: 13.2 % (ref 11.5–15.5)
WBC: 6.6 10*3/uL (ref 4.0–10.5)

## 2017-12-13 LAB — COMPREHENSIVE METABOLIC PANEL
ALT: 16 U/L (ref 0–44)
AST: 18 U/L (ref 15–41)
Albumin: 4 g/dL (ref 3.5–5.0)
Alkaline Phosphatase: 63 U/L (ref 38–126)
Anion gap: 9 (ref 5–15)
BILIRUBIN TOTAL: 1.1 mg/dL (ref 0.3–1.2)
BUN: 17 mg/dL (ref 8–23)
CO2: 24 mmol/L (ref 22–32)
CREATININE: 0.82 mg/dL (ref 0.61–1.24)
Calcium: 9.2 mg/dL (ref 8.9–10.3)
Chloride: 109 mmol/L (ref 98–111)
GFR calc Af Amer: >60 mL/min (ref >60)
GFR calc non Af Amer: >60 mL/min (ref >60)
Glucose, Bld: 104 mg/dL — ABNORMAL HIGH (ref 70–99)
Potassium: 4.1 mmol/L (ref 3.5–5.1)
Sodium: 142 mmol/L (ref 135–145)
Total Protein: 6.8 g/dL (ref 6.5–8.1)

## 2017-12-13 LAB — SURGICAL PCR SCREEN
MRSA, PCR: NEGATIVE
Staphylococcus aureus: NEGATIVE

## 2017-12-13 LAB — POCT I-STAT EG7
BICARBONATE: 24.2 mmol/L (ref 20.0–28.0)
Calcium, Ion: 1.18 mmol/L (ref 1.15–1.40)
HCT: 29 % — ABNORMAL LOW (ref 39.0–52.0)
HEMOGLOBIN: 9.9 g/dL — AB (ref 13.0–17.0)
O2 Saturation: 100 %
PCO2 VEN: 35.6 mmHg — AB (ref 44.0–60.0)
PO2 VEN: 196 mmHg — AB (ref 32.0–45.0)
Potassium: 3.9 mmol/L (ref 3.5–5.1)
SODIUM: 143 mmol/L (ref 135–145)
TCO2: 25 mmol/L (ref 22–32)
pH, Ven: 7.441 — ABNORMAL HIGH (ref 7.250–7.430)

## 2017-12-13 LAB — TYPE AND SCREEN
ABO/RH(D): O POS
ANTIBODY SCREEN: NEGATIVE

## 2017-12-13 LAB — POCT ACTIVATED CLOTTING TIME
ACTIVATED CLOTTING TIME: 241 s
ACTIVATED CLOTTING TIME: 268 s

## 2017-12-13 LAB — PROTIME-INR
INR: 1.06
Prothrombin Time: 13.7 seconds (ref 11.4–15.2)

## 2017-12-13 LAB — APTT: aPTT: 28 seconds (ref 24–36)

## 2017-12-13 SURGERY — TRANSCAROTID ARTERY REVASCULARIZATION (TCAR)
Anesthesia: General | Laterality: Right

## 2017-12-13 MED ORDER — CHLORHEXIDINE GLUCONATE CLOTH 2 % EX PADS: 6.0000 | MEDICATED_PAD | Freq: Once | CUTANEOUS | Status: DC

## 2017-12-13 MED ORDER — METOPROLOL TARTRATE 25 MG PO TABS
25.0000 mg | ORAL_TABLET | Freq: Every day | ORAL | Status: DC
  Administered 2017-12-14: 25 mg via ORAL
  Filled 2017-12-13: qty 1

## 2017-12-13 MED ORDER — PROTAMINE SULFATE 10 MG/ML IV SOLN
INTRAVENOUS | Status: DC | PRN
  Administered 2017-12-13: 20 mg via INTRAVENOUS
  Administered 2017-12-13: 10 mg via INTRAVENOUS
  Administered 2017-12-13: 20 mg via INTRAVENOUS

## 2017-12-13 MED ORDER — ONDANSETRON HCL 4 MG/2ML IJ SOLN
INTRAMUSCULAR | Status: AC
  Filled 2017-12-13: qty 2

## 2017-12-13 MED ORDER — IODIXANOL 320 MG/ML IV SOLN
INTRAVENOUS | Status: DC | PRN
  Administered 2017-12-13: 16 mL via INTRA_ARTERIAL

## 2017-12-13 MED ORDER — FENTANYL CITRATE (PF) 250 MCG/5ML IJ SOLN
INTRAMUSCULAR | Status: AC
  Filled 2017-12-13: qty 5

## 2017-12-13 MED ORDER — MUPIROCIN 2 % EX OINT
1.0000 "application " | TOPICAL_OINTMENT | Freq: Once | CUTANEOUS | Status: AC
  Administered 2017-12-13: 1 via TOPICAL

## 2017-12-13 MED ORDER — LACTATED RINGERS IV SOLN
INTRAVENOUS | Status: DC | PRN
  Administered 2017-12-13 (×2): via INTRAVENOUS

## 2017-12-13 MED ORDER — FUROSEMIDE 20 MG PO TABS: 20.0000 mg | ORAL_TABLET | Freq: Every day | ORAL | Status: DC | PRN

## 2017-12-13 MED ORDER — ROCURONIUM BROMIDE 50 MG/5ML IV SOSY
PREFILLED_SYRINGE | INTRAVENOUS | Status: AC
  Filled 2017-12-13: qty 25

## 2017-12-13 MED ORDER — DEXAMETHASONE SODIUM PHOSPHATE 10 MG/ML IJ SOLN
INTRAMUSCULAR | Status: AC
  Filled 2017-12-13: qty 1

## 2017-12-13 MED ORDER — ONDANSETRON HCL 4 MG/2ML IJ SOLN
INTRAMUSCULAR | Status: DC | PRN
  Administered 2017-12-13: 4 mg via INTRAVENOUS

## 2017-12-13 MED ORDER — OXYCODONE HCL 5 MG PO TABS
5.0000 mg | ORAL_TABLET | ORAL | Status: DC | PRN
  Administered 2017-12-13 (×2): 5 mg via ORAL
  Filled 2017-12-13: qty 1
  Filled 2017-12-13 (×3): qty 2

## 2017-12-13 MED ORDER — BISACODYL 5 MG PO TBEC: 5.0000 mg | DELAYED_RELEASE_TABLET | Freq: Every day | ORAL | Status: DC | PRN

## 2017-12-13 MED ORDER — MUPIROCIN 2 % EX OINT
TOPICAL_OINTMENT | CUTANEOUS | Status: AC
  Administered 2017-12-13: 1 via TOPICAL
  Filled 2017-12-13: qty 22

## 2017-12-13 MED ORDER — SODIUM CHLORIDE 0.9 % IV SOLN
INTRAVENOUS | Status: DC
  Administered 2017-12-13: 17:00:00 via INTRAVENOUS

## 2017-12-13 MED ORDER — SODIUM CHLORIDE 0.9 % IV SOLN
INTRAVENOUS | Status: AC
  Filled 2017-12-13: qty 1.2

## 2017-12-13 MED ORDER — SENNOSIDES-DOCUSATE SODIUM 8.6-50 MG PO TABS: 1.0000 | ORAL_TABLET | Freq: Every evening | ORAL | Status: DC | PRN

## 2017-12-13 MED ORDER — GLYCOPYRROLATE PF 0.2 MG/ML IJ SOSY
PREFILLED_SYRINGE | INTRAMUSCULAR | Status: DC | PRN
  Administered 2017-12-13 (×2): .1 mg via INTRAVENOUS
  Administered 2017-12-13: .2 mg via INTRAVENOUS

## 2017-12-13 MED ORDER — MAGNESIUM SULFATE 2 GM/50ML IV SOLN: 2.0000 g | Freq: Every day | INTRAVENOUS | Status: DC | PRN

## 2017-12-13 MED ORDER — LIDOCAINE 2% (20 MG/ML) 5 ML SYRINGE
INTRAMUSCULAR | Status: AC
  Filled 2017-12-13: qty 5

## 2017-12-13 MED ORDER — SODIUM CHLORIDE 0.9 % IV SOLN
INTRAVENOUS | Status: DC
  Administered 2017-12-13: 11:00:00 via INTRAVENOUS

## 2017-12-13 MED ORDER — PHENOL 1.4 % MT LIQD: 1.0000 | OROMUCOSAL | Status: DC | PRN

## 2017-12-13 MED ORDER — GLYCOPYRROLATE PF 0.2 MG/ML IJ SOSY
PREFILLED_SYRINGE | INTRAMUSCULAR | Status: AC
  Filled 2017-12-13: qty 2

## 2017-12-13 MED ORDER — SODIUM CHLORIDE 0.9 % IV SOLN
INTRAVENOUS | Status: DC | PRN
  Administered 2017-12-13: 13:00:00

## 2017-12-13 MED ORDER — 0.9 % SODIUM CHLORIDE (POUR BTL) OPTIME
TOPICAL | Status: DC | PRN
  Administered 2017-12-13: 1000 mL

## 2017-12-13 MED ORDER — ACETAMINOPHEN 325 MG RE SUPP: 325.0000 mg | RECTAL | Status: DC | PRN

## 2017-12-13 MED ORDER — ROCURONIUM BROMIDE 10 MG/ML (PF) SYRINGE
PREFILLED_SYRINGE | INTRAVENOUS | Status: DC | PRN
  Administered 2017-12-13 (×2): 50 mg via INTRAVENOUS

## 2017-12-13 MED ORDER — DEXAMETHASONE SODIUM PHOSPHATE 10 MG/ML IJ SOLN
INTRAMUSCULAR | Status: DC | PRN
  Administered 2017-12-13: 10 mg via INTRAVENOUS

## 2017-12-13 MED ORDER — FENTANYL CITRATE (PF) 100 MCG/2ML IJ SOLN: 25.0000 ug | INTRAMUSCULAR | Status: DC | PRN

## 2017-12-13 MED ORDER — ATORVASTATIN CALCIUM 20 MG PO TABS: 20.0000 mg | ORAL_TABLET | Freq: Every day | ORAL | Status: DC

## 2017-12-13 MED ORDER — FENTANYL CITRATE (PF) 100 MCG/2ML IJ SOLN
INTRAMUSCULAR | Status: DC | PRN
  Administered 2017-12-13: 100 ug via INTRAVENOUS
  Administered 2017-12-13: 50 ug via INTRAVENOUS

## 2017-12-13 MED ORDER — ACETAMINOPHEN 325 MG PO TABS
325.0000 mg | ORAL_TABLET | ORAL | Status: DC | PRN
  Administered 2017-12-13 – 2017-12-14 (×2): 650 mg via ORAL
  Filled 2017-12-13 (×2): qty 2

## 2017-12-13 MED ORDER — LABETALOL HCL 5 MG/ML IV SOLN: 10.0000 mg | INTRAVENOUS | Status: DC | PRN

## 2017-12-13 MED ORDER — VANCOMYCIN HCL IN DEXTROSE 1-5 GM/200ML-% IV SOLN
1000.0000 mg | Freq: Two times a day (BID) | INTRAVENOUS | Status: AC
  Administered 2017-12-13 – 2017-12-14 (×2): 1000 mg via INTRAVENOUS
  Filled 2017-12-13 (×2): qty 200

## 2017-12-13 MED ORDER — GUAIFENESIN ER 600 MG PO TB12: 1200.0000 mg | ORAL_TABLET | Freq: Two times a day (BID) | ORAL | Status: DC | PRN

## 2017-12-13 MED ORDER — ASPIRIN EC 81 MG PO TBEC
81.0000 mg | DELAYED_RELEASE_TABLET | Freq: Every day | ORAL | Status: DC
  Administered 2017-12-14: 81 mg via ORAL
  Filled 2017-12-13: qty 1

## 2017-12-13 MED ORDER — SODIUM CHLORIDE 0.9 % IV SOLN: 500.0000 mL | Freq: Once | INTRAVENOUS | Status: DC | PRN

## 2017-12-13 MED ORDER — PHENYLEPHRINE 40 MCG/ML (10ML) SYRINGE FOR IV PUSH (FOR BLOOD PRESSURE SUPPORT)
PREFILLED_SYRINGE | INTRAVENOUS | Status: DC | PRN
  Administered 2017-12-13: 120 ug via INTRAVENOUS

## 2017-12-13 MED ORDER — POTASSIUM CHLORIDE CRYS ER 20 MEQ PO TBCR: 20.0000 meq | EXTENDED_RELEASE_TABLET | Freq: Every day | ORAL | Status: DC | PRN

## 2017-12-13 MED ORDER — VITAMIN B-12 1000 MCG PO TABS
1000.0000 ug | ORAL_TABLET | Freq: Every day | ORAL | Status: DC
  Administered 2017-12-14: 1000 ug via ORAL
  Filled 2017-12-13: qty 1

## 2017-12-13 MED ORDER — HYDRALAZINE HCL 20 MG/ML IJ SOLN: 5.0000 mg | INTRAMUSCULAR | Status: DC | PRN

## 2017-12-13 MED ORDER — ALUM & MAG HYDROXIDE-SIMETH 200-200-20 MG/5ML PO SUSP: 15.0000 mL | ORAL | Status: DC | PRN

## 2017-12-13 MED ORDER — OMEGA-3-ACID ETHYL ESTERS 1 G PO CAPS
1.0000 g | ORAL_CAPSULE | Freq: Every day | ORAL | Status: DC
  Administered 2017-12-14: 1 g via ORAL
  Filled 2017-12-13: qty 1

## 2017-12-13 MED ORDER — PANTOPRAZOLE SODIUM 40 MG PO TBEC
40.0000 mg | DELAYED_RELEASE_TABLET | Freq: Every day | ORAL | Status: DC
  Administered 2017-12-14: 40 mg via ORAL
  Filled 2017-12-13: qty 1

## 2017-12-13 MED ORDER — METOPROLOL TARTRATE 5 MG/5ML IV SOLN: 2.0000 mg | INTRAVENOUS | Status: DC | PRN

## 2017-12-13 MED ORDER — PANTOPRAZOLE SODIUM 40 MG PO TBEC: 40.0000 mg | DELAYED_RELEASE_TABLET | Freq: Every day | ORAL | Status: DC

## 2017-12-13 MED ORDER — VANCOMYCIN HCL IN DEXTROSE 1-5 GM/200ML-% IV SOLN
1000.0000 mg | INTRAVENOUS | Status: AC
  Administered 2017-12-13: 1000 mg via INTRAVENOUS
  Filled 2017-12-13: qty 200

## 2017-12-13 MED ORDER — ENOXAPARIN SODIUM 30 MG/0.3ML ~~LOC~~ SOLN
30.0000 mg | SUBCUTANEOUS | Status: DC
  Administered 2017-12-14: 30 mg via SUBCUTANEOUS
  Filled 2017-12-13: qty 0.3

## 2017-12-13 MED ORDER — GUAIFENESIN-DM 100-10 MG/5ML PO SYRP: 15.0000 mL | ORAL_SOLUTION | ORAL | Status: DC | PRN

## 2017-12-13 MED ORDER — PROPOFOL 10 MG/ML IV BOLUS
INTRAVENOUS | Status: DC | PRN
  Administered 2017-12-13: 110 mg via INTRAVENOUS

## 2017-12-13 MED ORDER — HEPARIN SODIUM (PORCINE) 1000 UNIT/ML IJ SOLN
INTRAMUSCULAR | Status: DC | PRN
  Administered 2017-12-13 (×2): 1000 [IU] via INTRAVENOUS
  Administered 2017-12-13: 9000 [IU] via INTRAVENOUS

## 2017-12-13 MED ORDER — LIDOCAINE HCL (CARDIAC) PF 100 MG/5ML IV SOSY
PREFILLED_SYRINGE | INTRAVENOUS | Status: DC | PRN
  Administered 2017-12-13: 60 mg via INTRAVENOUS

## 2017-12-13 MED ORDER — CLOPIDOGREL BISULFATE 75 MG PO TABS
75.0000 mg | ORAL_TABLET | Freq: Every day | ORAL | Status: DC
  Administered 2017-12-14: 75 mg via ORAL
  Filled 2017-12-13: qty 1

## 2017-12-13 MED ORDER — LORAZEPAM 0.5 MG PO TABS
0.5000 mg | ORAL_TABLET | Freq: Two times a day (BID) | ORAL | Status: DC | PRN
  Administered 2017-12-14: 0.5 mg via ORAL
  Filled 2017-12-13: qty 1

## 2017-12-13 MED ORDER — ALBUTEROL SULFATE (2.5 MG/3ML) 0.083% IN NEBU: 2.5000 mg | INHALATION_SOLUTION | Freq: Four times a day (QID) | RESPIRATORY_TRACT | Status: DC | PRN

## 2017-12-13 MED ORDER — ONDANSETRON HCL 4 MG/2ML IJ SOLN: 4.0000 mg | Freq: Four times a day (QID) | INTRAMUSCULAR | Status: DC | PRN

## 2017-12-13 MED ORDER — DOCUSATE SODIUM 100 MG PO CAPS
100.0000 mg | ORAL_CAPSULE | Freq: Every day | ORAL | Status: DC
  Administered 2017-12-14: 100 mg via ORAL
  Filled 2017-12-13: qty 1

## 2017-12-13 MED ORDER — SUGAMMADEX SODIUM 200 MG/2ML IV SOLN
INTRAVENOUS | Status: DC | PRN
  Administered 2017-12-13: 200 mg via INTRAVENOUS

## 2017-12-13 MED ORDER — FENTANYL CITRATE (PF) 100 MCG/2ML IJ SOLN
INTRAMUSCULAR | Status: AC
  Filled 2017-12-13: qty 2

## 2017-12-13 MED ORDER — PROPOFOL 10 MG/ML IV BOLUS
INTRAVENOUS | Status: AC
  Filled 2017-12-13: qty 20

## 2017-12-13 MED ORDER — SODIUM CHLORIDE 0.9 % IV SOLN
INTRAVENOUS | Status: DC | PRN
  Administered 2017-12-13: 35 ug/min via INTRAVENOUS

## 2017-12-13 SURGICAL SUPPLY — 68 items
BAG BANDED W/RUBBER/TAPE 36X54 (MISCELLANEOUS) ×3 IMPLANT
BALLN STERLING RX 5X20X80 (BALLOONS) ×3
BALLOON STERLING RX 5X20X80 (BALLOONS) ×1 IMPLANT
CANISTER SUCT 3000ML PPV (MISCELLANEOUS) ×3 IMPLANT
CATH ROBINSON RED A/P 18FR (CATHETERS) IMPLANT
CLIP VESOCCLUDE MED 24/CT (CLIP) ×3 IMPLANT
CLIP VESOCCLUDE SM WIDE 24/CT (CLIP) ×3 IMPLANT
COVER DOME SNAP 22 D (MISCELLANEOUS) ×6 IMPLANT
COVER PROBE W GEL 5X96 (DRAPES) ×3 IMPLANT
CRADLE DONUT ADULT HEAD (MISCELLANEOUS) ×3 IMPLANT
DERMABOND ADVANCED (GAUZE/BANDAGES/DRESSINGS) ×4
DERMABOND ADVANCED .7 DNX12 (GAUZE/BANDAGES/DRESSINGS) ×2 IMPLANT
DRAIN CHANNEL 15F RND FF W/TCR (WOUND CARE) IMPLANT
DRAPE INCISE IOBAN 85X60 (DRAPES) ×6 IMPLANT
DRAPE UNIVERSAL PACK (DRAPES) ×3 IMPLANT
ELECT REM PT RETURN 9FT ADLT (ELECTROSURGICAL) ×3
ELECTRODE REM PT RTRN 9FT ADLT (ELECTROSURGICAL) ×1 IMPLANT
EVACUATOR SILICONE 100CC (DRAIN) IMPLANT
GLOVE BIO SURGEON STRL SZ7.5 (GLOVE) ×3 IMPLANT
GOWN STRL REUS W/ TWL LRG LVL3 (GOWN DISPOSABLE) ×2 IMPLANT
GOWN STRL REUS W/ TWL XL LVL3 (GOWN DISPOSABLE) ×1 IMPLANT
GOWN STRL REUS W/TWL LRG LVL3 (GOWN DISPOSABLE) ×4
GOWN STRL REUS W/TWL XL LVL3 (GOWN DISPOSABLE) ×2
GUIDEWIRE ENROUTE 0.014 (WIRE) ×3 IMPLANT
HEMOSTAT SNOW SURGICEL 2X4 (HEMOSTASIS) IMPLANT
INSERT FOGARTY SM (MISCELLANEOUS) IMPLANT
INTRODUCER KIT GALT 7CM (INTRODUCER) ×2
IV ADAPTER SYR DOUBLE MALE LL (MISCELLANEOUS) IMPLANT
KIT BASIN OR (CUSTOM PROCEDURE TRAY) ×3 IMPLANT
KIT ENCORE 26 ADVANTAGE (KITS) ×6 IMPLANT
KIT INTRODUCER GALT 7 (INTRODUCER) ×1 IMPLANT
KIT TURNOVER KIT B (KITS) ×3 IMPLANT
NEEDLE HYPO 25GX1X1/2 BEV (NEEDLE) IMPLANT
NEEDLE PERC 18GX7CM (NEEDLE) ×6 IMPLANT
NEEDLE SPNL 20GX3.5 QUINCKE YW (NEEDLE) IMPLANT
PACK CAROTID (CUSTOM PROCEDURE TRAY) ×3 IMPLANT
PAD ARMBOARD 7.5X6 YLW CONV (MISCELLANEOUS) ×6 IMPLANT
PROTECTION STATION PRESSURIZED (MISCELLANEOUS) ×3
SET MICROPUNCTURE 5F STIFF (MISCELLANEOUS) ×3 IMPLANT
SHEATH AVANTI 11CM 5FR (SHEATH) IMPLANT
SHUNT CAROTID BYPASS 10 (VASCULAR PRODUCTS) IMPLANT
SHUNT CAROTID BYPASS 12FRX15.5 (VASCULAR PRODUCTS) IMPLANT
STATION PROTECTION PRESSURIZED (MISCELLANEOUS) ×1 IMPLANT
STENT TRANSCAROTID SYSTEM 8X40 (Permanent Stent) ×3 IMPLANT
STOPCOCK 4 WAY LG BORE MALE ST (IV SETS) ×3 IMPLANT
SUT ETHILON 3 0 PS 1 (SUTURE) IMPLANT
SUT MNCRL AB 4-0 PS2 18 (SUTURE) ×3 IMPLANT
SUT PROLENE 5 0 C 1 24 (SUTURE) ×3 IMPLANT
SUT PROLENE 6 0 BV (SUTURE) ×3 IMPLANT
SUT PROLENE 7 0 BV 1 (SUTURE) IMPLANT
SUT SILK 2 0 SH CR/8 (SUTURE) ×3 IMPLANT
SUT SILK 3 0 (SUTURE)
SUT SILK 3-0 18XBRD TIE 12 (SUTURE) IMPLANT
SUT VIC AB 3-0 SH 27 (SUTURE) ×2
SUT VIC AB 3-0 SH 27X BRD (SUTURE) ×1 IMPLANT
SUT VICRYL 4-0 PS2 18IN ABS (SUTURE) ×3 IMPLANT
SYR 10ML LL (SYRINGE) ×9 IMPLANT
SYR 20CC LL (SYRINGE) ×3 IMPLANT
SYR 5ML LL (SYRINGE) ×3 IMPLANT
SYR CONTROL 10ML LL (SYRINGE) IMPLANT
SYSTEM TRANSCAROTID NEUROPRTCT (MISCELLANEOUS) ×1 IMPLANT
TOWEL GREEN STERILE (TOWEL DISPOSABLE) ×3 IMPLANT
TRANSCAROTID NEUROPROTECT SYS (MISCELLANEOUS) ×3
TUBING ART PRESS 48 MALE/FEM (TUBING) IMPLANT
TUBING EXTENTION W/L.L. (IV SETS) IMPLANT
WATER STERILE IRR 1000ML POUR (IV SOLUTION) ×3 IMPLANT
WIRE AMPLATZ SS-J .035X180CM (WIRE) IMPLANT
WIRE BENTSON .035X145CM (WIRE) ×6 IMPLANT

## 2017-12-13 NOTE — Transfer of Care (Signed)
Immediate Anesthesia Transfer of Care Note  Patient: Matthew Bridges  Procedure(s) Performed: RIGHT TRANSCAROTID ARTERY REVASCULARIZATION (Right )  Patient Location: PACU  Anesthesia Type:General  Level of Consciousness: awake, alert  and patient cooperative  Airway & Oxygen Therapy: Patient Spontanous Breathing and Patient connected to nasal cannula oxygen  Post-op Assessment: Report given to RN, Post -op Vital signs reviewed and stable, Patient moving all extremities X 4 and Patient able to stick tongue midline  Post vital signs: Reviewed and stable  Last Vitals:  Vitals Value Taken Time  BP 141/64 12/13/2017  2:41 PM  Temp    Pulse 76 12/13/2017  2:45 PM  Resp 12 12/13/2017  2:45 PM  SpO2 93 % 12/13/2017  2:45 PM  Vitals shown include unvalidated device data.  Last Pain:  Vitals:   12/13/17 1055  TempSrc: Oral  PainSc: 0-No pain      Patients Stated Pain Goal: 0 (12/13/17 1055)  Complications: No apparent anesthesia complications

## 2017-12-13 NOTE — Anesthesia Procedure Notes (Signed)
Arterial Line Insertion Start/End9/26/2019 11:35 AM, 12/13/2017 11:50 AM Performed by: Adair Laundry, CRNA, CRNA  Patient location: Pre-op. Preanesthetic checklist: patient identified, IV checked, risks and benefits discussed, surgical consent and anesthesia consent Lidocaine 1% used for infiltration and patient sedated Left, radial was placed Catheter size: 20 G Hand hygiene performed , maximum sterile barriers used  and Seldinger technique used  Attempts: 2 Procedure performed without using ultrasound guided technique. Following insertion, dressing applied and Biopatch. Post procedure assessment: normal  Patient tolerated the procedure well with no immediate complications. Additional procedure comments: A-line insertion by Sandford Craze.Marland Kitchen

## 2017-12-13 NOTE — H&P (Signed)
History and Physical Interval Note:  12/13/2017 10:15 AM  [name]  has presented today for surgery, with the diagnosis of right carotid stenosis  The various methods of treatment have been discussed with the patient and family. After consideration of risks, benefits and other options for treatment, the patient has consented to  Procedure(s): TRANSCAROTID ARTERY REVASCULARIZATION (Right) as a surgical intervention .  The patient's history has been reviewed, patient examined, no change in status, stable for surgery.  I have reviewed the patient's chart and labs.  Questions were answered to the patient's satisfaction.     Right TCAR  [name]  Patient name: Matthew Bridges          MRN: [id number]        DOB: 01-01-1931          Sex: male  REASON FOR CONSULT: Bilateral carotid stenosis  HPI: Matthew Bridges is a 84 y.o. male with multiple medical comorbidities including coronary artery disease status post CABG in 2013, COPD, hyperlipidemia that presents for evaluation of bilateral carotid stenosis.  Patient states he has been under surveillance for years for carotid disease.  On most recent duplex was noted to have a right ICA velocity of 671/325 as well as a left ICA velocity of 462/126 both consistent with high-grade stenosis and was referred here.  Family states he had a stroke in 41.  Since then he describes no unilateral vision loss.  He does have some bilateral blurry vision.  Seems he is quite frail and walks with a walker.  States he is most concerned about his dizziness.  He does endorse some chronic numbness to his left hand that waxes and wanes.  In talking with his wife and daughter they state this has been present for years and does not appear to be any different than baseline (has seen hand surgeon in past as well).  He is currently on aspirin as well as a statin.      Past Medical History:  Diagnosis Date  . Alcohol abuse   . Anemia   . Anxiety   .  Arthritis   . Asthmatic bronchitis   . B12 deficiency 01/01/2012  . Blood transfusion   . BPH (benign prostatic hyperplasia) 10/02/2011  . Bronchiectasis   . Carotid artery occlusion   . Chronic low back pain 10/02/2011  . COPD (chronic obstructive pulmonary disease) (HCC)    sees Dr/ Delford Field, saw last Dec 2012  . Coronary artery disease    sees Dr. Eden Emms, saw 1 month ago  . Diverticulosis    Colonoscopy 2007/Dr. Leone Payor  . Emotional lability 10/02/2011  . GERD (gastroesophageal reflux disease)   . H/O hiatal hernia   . Hx of colonic polyp    tubular adenoma (05/16/2009), hyperplastic polyp  . Hyperlipidemia   . Insomnia   . Lumbar disc disease 10/02/2011  . Nephrolithiasis   . 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 Orthostatic hypotension 03/29/2012  . Pleurisy   . Pneumonia   . Pulmonary fibrosis (HCC) 07/10/2013  . S/P CABG (coronary artery bypass graft)   . Shortness of breath          Past Surgical History:  Procedure Laterality Date  . CARDIAC CATHETERIZATION     Friday 4, 2013  . CATARACT EXTRACTION     bilateral  . COLONOSCOPY     multiple  . CORONARY ARTERY BYPASS GRAFT  03/31/2011   Procedure: CORONARY ARTERY BYPASS  GRAFTING (CABG);  Surgeon: [name], MD;  Location: [facility] OR;  Service: Open Heart Surgery;  Laterality: N/A;  Times three using the mammary and right leg greater saphenous vein.   Marland Kitchen KIDNEY STONE SURGERY     removal of kidney stone  . TONSILLECTOMY           Family History  Problem Relation Age of Onset  . Heart disease Mother   . Colon cancer Father   . Alcohol abuse Father   . Heart disease Other        maternal side   . Colon cancer Brother   . Lung cancer Brother   . Breast cancer Sister   . Heart disease Sister     SOCIAL HISTORY: Social History        Socioeconomic History  . Marital status: Married    Spouse name: Not on file  . Number of  children: 3  . Years of education: Not on file  . Highest education level: Not on file  Occupational History  . Occupation: Retired  Engineer, production  . Financial resource strain: Not on file  . Food insecurity:    Worry: Not on file    Inability: Not on file  . Transportation needs:    Medical: Not on file    Non-medical: Not on file  Tobacco Use  . Smoking status: Former Smoker    Packs/day: 1.00    Years: 10.00    Pack years: 10.00    Types: Cigarettes    Last attempt to quit: 03/20/1956    Years since quitting: 61.7  . Smokeless tobacco: Former Neurosurgeon    Types: Snuff, Dorna Bloom    Quit date: 03/20/1948  Substance and Sexual Activity  . Alcohol use: No  . Drug use: No  . Sexual activity: Not on file  Lifestyle  . Physical activity:    Days per week: Not on file    Minutes per session: Not on file  . Stress: Not on file  Relationships  . Social connections:    Talks on phone: Not on file    Gets together: Not on file    Attends religious service: Not on file    Active member of club or organization: Not on file    Attends meetings of clubs or organizations: Not on file    Relationship status: Not on file  . Intimate partner violence:    Fear of current or ex partner: Not on file    Emotionally abused: Not on file    Physically abused: Not on file    Forced sexual activity: Not on file  Other Topics Concern  . Not on file  Social History Narrative  . Not on file         Allergies  Allergen Reactions  . Penicillins Hives and Rash  . Alprazolam Other (See Comments)    Confusion, weakness Confusion, weakness  . Codeine Other (See Comments)    Makes him feel dizzy, out of it Makes him feel dizzy, out of it Makes him feel dizzy, out of it  . Tramadol     Zombie state Zombie state  . Dulcolax [Bisacodyl] Nausea And Vomiting    Makes stomach hurt Makes stomach hurt Makes stomach hurt          Current  Outpatient Medications  Medication Sig Dispense Refill  . albuterol (PROVENTIL HFA;VENTOLIN HFA) 108 (90 Base) MCG/ACT inhaler Inhale 2 puffs into the lungs every 6 (six) hours as  needed for wheezing or shortness of breath. 1 Inhaler 0  . aspirin 81 MG tablet Take 81 mg by mouth daily.    Marland Kitchen atorvastatin (LIPITOR) 20 MG tablet Take 1 tablet (20 mg total) by mouth daily. 90 tablet 3  . Bisacodyl (LAXATIVE PO) Take 2 tablets by mouth daily.    . furosemide (LASIX) 20 MG tablet Take 20 mg by mouth daily as needed for fluid or edema.    Marland Kitchen guaiFENesin (MUCINEX) 600 MG 12 hr tablet Take 2 tablets (1,200 mg total) by mouth 2 (two) times daily as needed for cough or to loosen phlegm.    Marland Kitchen LORazepam (ATIVAN) 0.5 MG tablet TAKE ONE TABLET BY MOUTH TWICE DAILY AS NEEDED FOR ANXIETY 60 tablet 5  . metoprolol tartrate (LOPRESSOR) 25 MG tablet TAKE ONE TABLET BY MOUTH TWICE DAILY 60 tablet 11  . omeprazole (PRILOSEC) 20 MG capsule TAKE ONE CAPSULE BY MOUTH ONCE DAILY 30 capsule 1   No current facility-administered medications for this visit.     REVIEW OF SYSTEMS:  [X]  denotes positive finding, [ ]  denotes negative finding Cardiac  Comments:  Chest pain or chest pressure:    Shortness of breath upon exertion:    Short of breath when lying flat:    Irregular heart rhythm:        Vascular    Pain in calf, thigh, or hip brought on by ambulation:    Pain in feet at night that wakes you up from your sleep:     Blood clot in your veins:    Leg swelling:         Pulmonary    Oxygen at home:    Productive cough:     Wheezing:         Neurologic    Sudden weakness in arms or legs:     Sudden numbness in arms or legs:     Sudden onset of difficulty speaking or slurred speech:    Temporary loss of vision in one eye:     Problems with dizziness:         Gastrointestinal    Blood in stool:     Vomited blood:         Genitourinary     Burning when urinating:     Blood in urine:        Psychiatric    Major depression:         Hematologic    Bleeding problems:    Problems with blood clotting too easily:        Skin    Rashes or ulcers:        Constitutional    Fever or chills:      PHYSICAL EXAM:     Vitals:   11/27/17 1100 11/27/17 1102  BP: (!) 157/70 (!) 158/76  Pulse: 68   Resp: 20   SpO2: 99%   Weight: 91 kg   Height: 5\' 10"  (1.778 m)     GENERAL: The patient is a well-nourished male, in no acute distress. The vital signs are documented above. CARDIAC: There is a regular rate and rhythm.  VASCULAR: 2+ radial pulse palpable bilateral upper extremity 2+ palpable femoral pulse bilateral groins PULMONARY: No respiratory distress. ABDOMEN: Soft and non-tender with normal pitched bowel sounds.  MUSCULOSKELETAL: There are no major deformities or cyanosis. NEUROLOGIC: No focal weakness or paresthesias are detected.  CN II-XII grossly intact.  Appears to have intact motor and chronic numbness in left hand.  SKIN: There are no ulcers or rashes noted. PSYCHIATRIC: The patient has a normal affect.  DATA:   I independently reviewed noninvasive imaging that shows a right ICA velocity of 671/325 as well as a left ICA velocity of 462/126 both consistent with high-grade stenosis greater than 80%.  He has antegrade flow of both vertebral arteries.  Assessment/Plan:  84 year old male who presents with bilateral high-grade ICA stenosis.  His right ICA velocity is significantly more elevated than the left.  For now I am calling him asymptomatic bilateral high-grade stenosis.  He does have some chronic numbness in his left hand that has been waxing and waning for years and to the best of my ability I cannot tell that the symptoms are any different from his baseline.  He is quite frail and walks with a walker.  He underwent a stress test yesterday for chest pain and  shortness of breath when he walks with results pending.  Discussed that we would normally recommend intervention for greater than 80% carotid stenosis regardless of whether it is asymptomatic or a symptomatically lesion.  For now I recommended a CTA neck to see if he would be a candidate for TCAR (carotid stent) given his medical co-morbidities and age.  He would qualify given age >91, contralateral carotid disease, and cardiac history pending his anatomy on the scan.  I will contact the patient's son Orvilla Fus at [phone] once we have the results to make a decision moving forward.  I would plan to repair the right side first later this month.     [name], MD [facility] [place] Office: [phone] Pager: [phone]

## 2017-12-13 NOTE — Progress Notes (Signed)
Patient received from PACU A&Ox4, VSS. Telemetry applied and CCMD notified. 

## 2017-12-13 NOTE — Op Note (Signed)
Date: December 13, 2017  Preoperative diagnosis: Asymptomatic high grade right internal carotid artery stenosis (string sign)  Postoperative diagnosis: Same  Procedure: 1.  Ultrasound-guided access of the left common femoral vein 2.  Transcatheter placement of right cervical carotid artery stent including angioplasty (right trans-carotid artery revascularization with flow reversal neuroprotection)  Surgeon: Dr. [name], MD  Assistant: Dr. Lemar Livings, MD  Indications: Patient is an 84 year old male who was recently seen in clinic with an asymptomatic high-grade stenosis of his right internal carotid artery based on duplex velocity of 671/325.  Given his age as well as concern for contralateral disease we obtained a CTA to evaluate for a trans-carotid artery revascularization with stent placement.  He presents today for planned TCAR after risks and benefits have been discussed including risk of stroke, MI, bleeding, etc.  Findings: High-grade right internal carotid artery stenosis >90% with string sign.  Lesion length was 29 mm.  Predilation with a 5 mm x 20 mm Sterling.  Delivery of a 8 mm x 40 mm Enroute trans-carotid stent.  No evidence of residual stenosis after pre-dilation and stent deployment. The ECA appears chronically occluded.  Complications: None  Details: Patient was taken to the operating room after informed consent was obtained.  He was placed on operative table in supine position.  General endotracheal anesthesia was induced.  A timeout was performed to identify patient, procedure, site.  Initially his left common femoral vein was accessed under ultrasound guidance with a 18-gauge needle.  The vein was widely patent and an image was saved.  A Bentson wire was placed in the femoral vein and ultimately the trans-carotid femoral sheath was placed in the femoral vein over the wire. We then turned our attention to his right neck where under ultrasound guidance we  identified his carotid artery just above his clavicle as well as the division of his sternocleidomastoid muscle.  Ultimately a longitudinal incision was made just above his right clavicle at the division of the sternocleidomastoid.  Using blunt dissection and Bovie cautery we dissected down to the common carotid artery.  We were careful to dissect away the internal jugular vein as well as preserve the vagus nerve.  The common carotid artery was then looped with a umbilical tape and a large vessel loop.  The patient was then given 9000 units of IV heparin.  ACT was checked and initially was 242.  An additional 2000 units of IV heparin was given.  We then placed a pursestring in the common carotid artery using 5-0 Prolene.  We waited until our ACT was therapeutic above 250.  The left common carotid artery was then accessed with a micro access needle and our microwire was placed into the common carotid artery over which we placed a micro-sheath.  A right carotid angiogram was then obtained that showed a high-grade stenosis >90% with string sign of the proximal right internal carotid artery.  The external carotid artery had a patent segment proximally but then appeared to be occluded.  We exchanged for our stiff amplatz wire and the TCAR sheath was placed into the right common carotid artery over the wire.  The system was connected and the patient was placed under passive flow reversal that was confirmed.  After our TCAR timout, we pulled up on the vessel loop on the base of the common carotid artery and the patient was under active flow reversal with the neuroprotective TCAR system.  We then used our 0.014 soft angled wire and our  predilation balloon to engage the internal carotid artery and passed the high-grade stenosis through the string sign into normal ICA distally.  The high-grade ICA stenosis and bifurcation were then predilated with a 5 mm x 20 mm Sterling balloon.  We then placed a 8 mm x 40 mm trans-carotid  stent into the internal carotid artery past the lesion and it covered all the disease into the common carotid artery proximal.  The stent was deployed under fluoroscopic guidance.  We kept active flow reversal in place for an additional 2 minutes after deployment.  The total active clamp time with flow reversal was 5 minutes and 14 seconds.  A final carotid and cerebral angiogram was obtained that showed no evidence of residual stenosis with a widely patent internal carotid artery.  The wire was then removed as well as the stent delivery system.  Our common carotid vessel loop was removed and the umbilical tape.  We restored active flow in the common carotid artery.  The Enroute carotid stent system sheath was removed from the common carotid artery and the arteriotomy was tied down with 5-0 Prolene that had been previously placed.  At this point in time the patient was given 50 mg of protamine.  The sheath in the left femoral vein was removed.  An additional 5 minutes of pressure was held over the left femoral vein for hemostasis.  The wound at the neck was then copiously irrigated with saline until effluent was clear.  I did place one additional 6-0 Prolene U stitch at the arteriotomy to get hemostasis.  The subcutaneous tissue was then closed with a running 3-0 Vicryl.  The skin was closed with running 4-0 Monocryl.  Dermabond was applied.  Patient was then awakened from anesthesia and was moving his left side appropriately with no focal deficits.  He was taken to the PACU in stable condition.  Anesthesia: General  Condition: Stable  [name], MD [facility] Office: [phone] Pager: [phone]  [name]

## 2017-12-13 NOTE — Anesthesia Procedure Notes (Signed)
Procedure Name: Intubation Date/Time: 12/13/2017 12:44 PM Performed by: Carney Living, CRNA Pre-anesthesia Checklist: Patient identified, Emergency Drugs available, Suction available and Patient being monitored Patient Re-evaluated:Patient Re-evaluated prior to induction Oxygen Delivery Method: Circle System Utilized Preoxygenation: Pre-oxygenation with 100% oxygen Induction Type: IV induction Ventilation: Mask ventilation without difficulty Laryngoscope Size: Mac and 4 Grade View: Grade I Tube type: Oral Tube size: 7.5 mm Number of attempts: 1 Airway Equipment and Method: Stylet and Oral airway Placement Confirmation: ETT inserted through vocal cords under direct vision,  positive ETCO2 and breath sounds checked- equal and bilateral Secured at: 21 cm Tube secured with: Tape Dental Injury: Teeth and Oropharynx as per pre-operative assessment  Comments: Performed by Gillian Scarce

## 2017-12-13 NOTE — Anesthesia Postprocedure Evaluation (Signed)
Anesthesia Post Note  Patient: Matthew Bridges  Procedure(s) Performed: RIGHT TRANSCAROTID ARTERY REVASCULARIZATION (Right )     Patient location during evaluation: PACU Anesthesia Type: General Level of consciousness: awake and alert, awake and oriented Pain management: pain level controlled Vital Signs Assessment: post-procedure vital signs reviewed and stable Respiratory status: spontaneous breathing, nonlabored ventilation and respiratory function stable Cardiovascular status: blood pressure returned to baseline and stable Postop Assessment: no apparent nausea or vomiting Anesthetic complications: no    Last Vitals:  Vitals:   12/13/17 1706 12/13/17 1948  BP: 106/63 (!) 141/69  Pulse: 75   Resp: 15 16  Temp:  36.7 C  SpO2: 98% 97%    Last Pain:  Vitals:   12/13/17 1948  TempSrc: Oral  PainSc:                  Cecile Hearing

## 2017-12-13 NOTE — Anesthesia Preprocedure Evaluation (Addendum)
Anesthesia Evaluation  Patient identified by MRN, date of birth, ID band Patient awake    Reviewed: Allergy & Precautions, H&P , NPO status , Patient's Chart, lab work & pertinent test results, reviewed documented beta blocker date and time   Airway Mallampati: II  TM Distance: >3 FB Neck ROM: Full    Dental no notable dental hx. (+) Teeth Intact, Dental Advisory Given   Pulmonary asthma , COPD,  COPD inhaler, former smoker,    Pulmonary exam normal breath sounds clear to auscultation       Cardiovascular + CAD, + CABG and + Peripheral Vascular Disease   Rhythm:Regular Rate:Normal     Neuro/Psych Anxiety negative neurological ROS     GI/Hepatic Neg liver ROS, GERD  Medicated and Controlled,  Endo/Other  negative endocrine ROS  Renal/GU negative Renal ROS  negative genitourinary   Musculoskeletal  (+) Arthritis ,   Abdominal   Peds  Hematology negative hematology ROS (+)   Anesthesia Other Findings   Reproductive/Obstetrics negative OB ROS                            Anesthesia Physical Anesthesia Plan  ASA: III  Anesthesia Plan: General   Post-op Pain Management:    Induction: Intravenous  PONV Risk Score and Plan: 3 and Ondansetron, Dexamethasone and Treatment may vary due to age or medical condition  Airway Management Planned: Oral ETT  Additional Equipment: Arterial line  Intra-op Plan:   Post-operative Plan: Extubation in OR  Informed Consent: I have reviewed the patients History and Physical, chart, labs and discussed the procedure including the risks, benefits and alternatives for the proposed anesthesia with the patient or authorized representative who has indicated his/her understanding and acceptance.   Dental advisory given  Plan Discussed with: CRNA, Anesthesiologist and Surgeon  Anesthesia Plan Comments:        Anesthesia Quick Evaluation

## 2017-12-14 ENCOUNTER — Encounter (HOSPITAL_COMMUNITY): Payer: Self-pay | Admitting: Vascular Surgery

## 2017-12-14 ENCOUNTER — Telehealth: Payer: Self-pay | Admitting: Vascular Surgery

## 2017-12-14 LAB — CBC
HEMATOCRIT: 28.5 % — AB (ref 39.0–52.0)
Hemoglobin: 9.5 g/dL — ABNORMAL LOW (ref 13.0–17.0)
MCH: 35.1 pg — ABNORMAL HIGH (ref 26.0–34.0)
MCHC: 33.3 g/dL (ref 30.0–36.0)
MCV: 105.2 fL — ABNORMAL HIGH (ref 78.0–100.0)
Platelets: 82 10*3/uL — ABNORMAL LOW (ref 150–400)
RBC: 2.71 MIL/uL — ABNORMAL LOW (ref 4.22–5.81)
RDW: 13.2 % (ref 11.5–15.5)
WBC: 8.5 10*3/uL (ref 4.0–10.5)

## 2017-12-14 LAB — BASIC METABOLIC PANEL
Anion gap: 7 (ref 5–15)
BUN: 18 mg/dL (ref 8–23)
CALCIUM: 8.7 mg/dL — AB (ref 8.9–10.3)
CO2: 22 mmol/L (ref 22–32)
CREATININE: 0.76 mg/dL (ref 0.61–1.24)
Chloride: 111 mmol/L (ref 98–111)
GFR calc Af Amer: >60 mL/min (ref >60)
Glucose, Bld: 138 mg/dL — ABNORMAL HIGH (ref 70–99)
Potassium: 4.2 mmol/L (ref 3.5–5.1)
Sodium: 140 mmol/L (ref 135–145)

## 2017-12-14 MED ORDER — OXYCODONE HCL 5 MG PO TABS: 5.0000 mg | ORAL_TABLET | Freq: Four times a day (QID) | ORAL | 0 refills | Status: DC | PRN

## 2017-12-14 NOTE — Progress Notes (Signed)
Pt doing well this afternoon.  Wife states he is doing well but a little confused after he falls asleep and wakes up.  His neuro is in tact.  Dr. Chestine Spore feels his confusion will improve at home and wife states pt did much better at home after his heart surgery.  Will dc home this afternoon.  Discussed with wife that we will send an rx for pain medication but he can take tylenol unless he needs something stronger.      Doreatha Massed, PA-C [facility] [phone] 12/14/2017 2:20 PM

## 2017-12-14 NOTE — Progress Notes (Signed)
  Progress Note    12/14/2017 7:54 AM 1 Day Post-Op  Subjective:  Denies pain but slightly confused this am  Afebrile HR 50's-70's  100's-140's systolic 98% RA  Vitals:   12/14/17 0445 12/14/17 0738  BP: (!) 141/69 (!) 138/55  Pulse: 60 62  Resp:  18  Temp: 97.9 F (36.6 C) 97.8 F (36.6 C)  SpO2: (!) 0%      Physical Exam: Neuro:  In tact Lungs:  Non labored Incision:  Clean and dry  CBC    Component Value Date/Time   WBC 8.5 12/14/2017 0442   RBC 2.71 (L) 12/14/2017 0442   HGB 9.5 (L) 12/14/2017 0442   HCT 28.5 (L) 12/14/2017 0442   PLT 82 (L) 12/14/2017 0442   MCV 105.2 (H) 12/14/2017 0442   MCH 35.1 (H) 12/14/2017 0442   MCHC 33.3 12/14/2017 0442   RDW 13.2 12/14/2017 0442   LYMPHSABS 3.0 01/09/2013 1437   MONOABS 0.5 01/09/2013 1437   EOSABS 1.2 (H) 01/09/2013 1437   BASOSABS 0.1 01/09/2013 1437    BMET    Component Value Date/Time   NA 140 12/14/2017 0442   K 4.2 12/14/2017 0442   CL 111 12/14/2017 0442   CO2 22 12/14/2017 0442   GLUCOSE 138 (H) 12/14/2017 0442   BUN 18 12/14/2017 0442   CREATININE 0.76 12/14/2017 0442   CALCIUM 8.7 (L) 12/14/2017 0442   GFRNONAA >60 12/14/2017 0442   GFRAA >60 12/14/2017 0442     Intake/Output Summary (Last 24 hours) at 12/14/2017 0754 Last data filed at 12/14/2017 0448 Gross per 24 hour  Intake 3191.25 ml  Output 700 ml  Net 2491.25 ml     Assessment/Plan:  This is a 84 y.o. adult who is s/p  1.  Ultrasound-guided access of the left common femoral vein 2.  Transcatheter placement of right cervical carotid artery stent including angioplasty (right trans-carotid artery revascularization with flow reversal neuroprotection)   1 Day Post-Op  -pt is doing well this am.  He is a little confused this morning.  If pt does well this am and is able to ambulate and eat okay, will discharge home later this afternoon as his confusion will probably get better at home.  -pt neuro exam is in tact -pt has not  ambulated -pt has voided -f/u with Dr. Chestine Spore in 4 weeks with duplex-   Doreatha Massed, PA-C [facility] [phone]

## 2017-12-14 NOTE — Discharge Instructions (Signed)
   [facility]  Discharge Instructions   Carotid Surgery  Please refer to the following instructions for your post-procedure care. Your surgeon or physician assistant will discuss any changes with you.  Activity  You are encouraged to walk as much as you can. You can slowly return to normal activities but must avoid strenuous activity and heavy lifting until your doctor tell you it's okay. Avoid activities such as vacuuming or swinging a golf club. You can drive after one week if you are comfortable and you are no longer taking prescription pain medications. It is normal to feel tired for serval weeks after your surgery. It is also normal to have difficulty with sleep habits, eating, and bowel movements after surgery. These will go away with time.  Bathing/Showering  Shower daily after you go home. Do not soak in a bathtub, hot tub, or swim until the incision heals completely.  Incision Care  Shower every day. Clean your incision with mild soap and water. Pat the area dry with a clean towel. You do not need a bandage unless otherwise instructed. Do not apply any ointments or creams to your incision. You may have skin glue on your incision. Do not peel it off. It will come off on its own in about one week. Your incision may feel thickened and raised for several weeks after your surgery. This is normal and the skin will soften over time.   For Men Only: It's okay to shave around the incision but do not shave the incision itself for 2 weeks. It is common to have numbness under your chin that could last for several months.  Diet  Resume your normal diet. There are no special food restrictions following this procedure. A low fat/low cholesterol diet is recommended for all patients with vascular disease. In order to heal from your surgery, it is CRITICAL to get adequate nutrition. Your body requires vitamins, minerals, and protein. Vegetables are the best source of  vitamins and minerals. Vegetables also provide the perfect balance of protein. Processed food has little nutritional value, so try to avoid this.  Medications  Resume taking all of your medications unless your doctor or physician assistant tells you not to. If your incision is causing pain, you may take over-the- counter pain relievers such as acetaminophen (Tylenol). If you were prescribed a stronger pain medication, please be aware these medications can cause nausea and constipation. Prevent nausea by taking the medication with a snack or meal. Avoid constipation by drinking plenty of fluids and eating foods with a high amount of fiber, such as fruits, vegetables, and grains.   Do not take Tylenol if you are taking prescription pain medications.  Follow Up  Our office will schedule a follow up appointment 2-3 weeks following discharge.  Please call us immediately for any of the following conditions  . Increased pain, redness, drainage (pus) from your incision site. . Fever of 101 degrees or higher. . If you should develop stroke (slurred speech, difficulty swallowing, weakness on one side of your body, loss of vision) you should call 911 and go to the nearest emergency room. .  Reduce your risk of vascular disease:  . Stop smoking. If you would like help call [facility] at 1-800-QUIT-NOW (1-800-784-8669) or [place] at 336-586-4000. . Manage your cholesterol . Maintain a desired weight . Control your diabetes . Keep your blood pressure down .  If you have any questions, please call the office at [phone]. 

## 2017-12-14 NOTE — Telephone Encounter (Signed)
sch appt [facility] 01/29/18 8am Carotid 830am p/o MD

## 2017-12-14 NOTE — Discharge Summary (Signed)
Discharge Summary     Matthew Bridges 1930-11-02 84 y.o. adult  [id number]  Admission Date: 12/13/2017  Discharge Date: 12/14/17  Physician: [name], MD  Admission Diagnosis: right carotid stenosis   HPI:   This is a 84 y.o. adult with multiple medical comorbidities including coronary artery disease status post CABG in 2013, COPD, hyperlipidemia thatpresents for evaluation of bilateral carotid stenosis. Patient states hehas been under surveillance for yearsfor carotid disease. On most recent duplex was noted to have a right ICA velocity of 671/325 as well as a left ICA velocity of 462/126 both consistent with high-grade stenosisand was referred here.Family states he had a stroke in 8. Since then he describes no unilateral vision loss. He does have some bilateral blurry vision. Seems he is quite frail and walks with a walker. States he is most concerned about his dizziness.He does endorse some chronic numbness to his left hand that waxes and wanes. In talking with his wife and daughter they state this has been present for years anddoes not appear to be any different than baseline (has seen hand surgeon in past as well).He is currently on aspirin as well as a statin.  Hospital Course:  The patient was admitted to the hospital and taken to the operating room on 12/13/2017 and underwent  1.  Ultrasound-guided access of the left common femoral vein 2.  Transcatheter placement of right cervical carotid artery stent including angioplasty (right trans-carotid artery revascularization with flow reversal neuroprotection)   Findings: High-grade right internal carotid artery stenosis >90% with string sign.  Lesion length was 29 mm.  Predilation with a 5 mm x 20 mm [place].  Delivery of a 8 mm x 40 mm Enroute trans-carotid stent.  No evidence of residual stenosis after pre-dilation and stent deployment. The ECA appears chronically occluded.    The pt tolerated  the procedure well and was transported to the PACU in good condition.   By POD 1, the pt neuro status was in tact with exception of slight asymmetric smile that was noted pre op.  He was mildly confused overnight.   Will d/c his condom cath and ambulate pt.  He will continue asa/plavix and statin.  His right neck incision looks good.    Pt doing well this afternoon.  Wife states he is doing well but a little confused after he falls asleep and wakes up.  His neuro is in tact.  Dr. Chestine Spore feels his confusion will improve at home and wife states pt did much better at home after his heart surgery.  Will dc home this afternoon.  Discussed with wife that we will send an rx for pain medication but he can take tylenol unless he needs something stronger.    The remainder of the hospital course consisted of increasing mobilization and increasing intake of solids without difficulty.   Recent Labs    12/13/17 1053 12/13/17 1201 12/14/17 0442  NA 142 143 140  K 4.1 3.9 4.2  CL 109  --  111  CO2 24  --  22  GLUCOSE 104*  --  138*  BUN 17  --  18  CALCIUM 9.2  --  8.7*   Recent Labs    12/13/17 1053 12/13/17 1201 12/14/17 0442  WBC 6.6  --  8.5  HGB 11.6* 9.9* 9.5*  HCT 35.0* 29.0* 28.5*  PLT 100*  --  82*   Recent Labs    12/13/17 1053  INR 1.06  Discharge Diagnosis:  right carotid stenosis  Secondary Diagnosis: Patient Active Problem List   Diagnosis Date Noted  . Carotid stenosis 12/13/2017  . Referred otalgia of left ear 08/21/2017  . 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 Imbalance 08/21/2017  . Hyperopia with astigmatism and presbyopia, bilateral 07/19/2017  . Ischemic foot pain at rest [facility]) 03/23/2017  . PAD (peripheral artery disease) (HCC) 03/23/2017  . Shortness of breath 06/05/2016  . Rash 10/25/2015  . Weakness 09/15/2015  . Unsteadiness 07/26/2015  . Bilateral hearing loss 06/07/2015  . Bilateral impacted cerumen 06/07/2015  . Lightheaded  06/07/2015  . Generalized anxiety disorder 03/16/2015  . Primary osteoarthritis of right hip 03/16/2015  . Diverticulosis of large intestine 03/16/2015  . Chronic bilateral low back pain with right-sided sciatica 03/16/2015  . Urinary incontinence 03/16/2015  . Elevated PSA 03/16/2015  . Gastroesophageal reflux disease without esophagitis 03/16/2015  . Abnormality of gait and mobility 03/16/2015  . ASCVD (arteriosclerotic cardiovascular disease) 03/16/2015  . Thrombocytopenia (HCC) 02/09/2015  . Arthritis of right hip 01/26/2014  . SI joint arthritis 10/22/2013  . Rotator cuff arthropathy 07/22/2013  . Pulmonary fibrosis (HCC) 07/10/2013  . Chronic right hip pain 07/10/2013  . Left carpal tunnel syndrome 07/10/2013  . Olecranon bursitis of right elbow 07/10/2013  . Rash and nonspecific skin eruption 01/12/2013  . Peripheral edema 07/09/2012  . Chronic constipation 07/09/2012  . Right arm pain 04/24/2012  . Hearing loss of right ear 03/30/2012  . Orthostatic hypotension 03/29/2012  . Gait disorder 02/14/2012  . Right lateral epicondylitis 02/14/2012  . Chronic right shoulder pain 02/14/2012  . Vitamin B12 deficiency 01/01/2012  . Osteopenia 01/01/2012  . Lumbar disc disease 10/02/2011  . Emotional lability 10/02/2011  . Chronic low back pain 10/02/2011  . BPH (benign prostatic hyperplasia) 10/02/2011  . Carotid disease, bilateral (HCC) 09/20/2011  . Disorder of artery or arteriole (HCC) 09/20/2011  . Weight loss 06/13/2011  . Impaired glucose tolerance 06/13/2011  . Anemia 06/13/2011  . Abnormal weight loss 06/13/2011  . Hx of CABG 05/23/2011  . Paroxysmal atrial fibrillation (HCC) 05/23/2011  . Tremor 05/23/2011  . Chest pain on exertion 01/25/2011  . Dizziness 01/10/2011  . Anxiety 12/13/2010  . Preventative health care 12/11/2010  . Hypersensitivity pneumonitis with associated bronchiectasis 10/11/2010  . DIVERTICULOSIS-COLON 02/21/2010  . CONSTIPATION 02/21/2010  .  PERSONAL HX COLONIC POLYPS 02/21/2010  . Hyperlipidemia 05/21/2007  . Nondependent alcohol abuse, in remission 05/21/2007  . Other chronic pain 05/21/2007  . Insomnia 05/21/2007  . HX OF GALLSTONE 05/21/2007  . NEPHROLITHIASIS, HX OF 05/21/2007  . GERD 12/04/2006   Past Medical History:  Diagnosis Date  . Alcohol abuse   . Anemia   . Anxiety   . Arthritis   . Asthmatic bronchitis   . B12 deficiency 01/01/2012  . Blood transfusion   . BPH (benign prostatic hyperplasia) 10/02/2011  . Bronchiectasis   . Carotid artery occlusion   . Chronic low back pain 10/02/2011  . COPD (chronic obstructive pulmonary disease) (HCC)    sees Dr/ Delford Field, saw last Dec 2012  . Coronary artery disease    sees Dr. Eden Emms, saw 1 month ago  . Diverticulosis    Colonoscopy 2007/Dr. Leone Payor  . Emotional lability 10/02/2011  . GERD (gastroesophageal reflux disease)   . H/O hiatal hernia   . Hx of colonic polyp    tubular adenoma (05/16/2009), hyperplastic polyp  . Hyperlipidemia   . Insomnia   . Lumbar disc disease 10/02/2011  .  Nephrolithiasis   . 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 Orthostatic hypotension 03/29/2012  . Pleurisy   . Pneumonia   . Pulmonary fibrosis (HCC) 07/10/2013  . S/P CABG (coronary artery bypass graft)   . Shortness of breath     Allergies as of 12/14/2017      Reactions   Penicillins Hives, Rash   Has patient had a PCN reaction causing immediate rash, facial/tongue/throat swelling, SOB or lightheadedness with hypotension: Unknown Has patient had a PCN reaction causing severe rash involving mucus membranes or skin necrosis: Unknown Has patient had a PCN reaction that required hospitalization: Unknown Has patient had a PCN reaction occurring within the last 10 years: Unknown If all of the above answers are "NO", then may proceed with Cephalosporin use.   Alprazolam Other (See Comments)   Confusion, weakness   Codeine Other (See Comments)   Makes  him feel dizzy, out of it   Tramadol    Zombie state   Dulcolax [bisacodyl] Nausea And Vomiting   Makes stomach hurt      Medication List    TAKE these medications   albuterol 108 (90 Base) MCG/ACT inhaler Commonly known as:  PROVENTIL HFA;VENTOLIN HFA Inhale 2 puffs into the lungs every 6 (six) hours as needed for wheezing or shortness of breath.   aspirin 81 MG tablet Take 81 mg by mouth daily.   atorvastatin 20 MG tablet Commonly known as:  LIPITOR Take 1 tablet (20 mg total) by mouth daily. What changed:  when to take this   clopidogrel 75 MG tablet Commonly known as:  PLAVIX Take 1 tablet (75 mg total) by mouth daily.   clotrimazole-betamethasone cream Commonly known as:  LOTRISONE Apply 1 application topically 2 (two) times daily as needed (rash).   furosemide 20 MG tablet Commonly known as:  LASIX Take 20 mg by mouth daily as needed for fluid or edema.   guaiFENesin 600 MG 12 hr tablet Commonly known as:  MUCINEX Take 2 tablets (1,200 mg total) by mouth 2 (two) times daily as needed for cough or to loosen phlegm.   LORazepam 0.5 MG tablet Commonly known as:  ATIVAN TAKE ONE TABLET BY MOUTH TWICE DAILY AS NEEDED FOR ANXIETY What changed:    reasons to take this  additional instructions   metoprolol tartrate 25 MG tablet Commonly known as:  LOPRESSOR TAKE ONE TABLET BY MOUTH TWICE DAILY What changed:  when to take this   omega-3 acid ethyl esters 1 g capsule Commonly known as:  LOVAZA Take 1 g by mouth daily.   omeprazole 20 MG capsule Commonly known as:  PRILOSEC TAKE ONE CAPSULE BY MOUTH ONCE DAILY   oxyCODONE 5 MG immediate release tablet Commonly known as:  Oxy IR/ROXICODONE Take 1 tablet (5 mg total) by mouth every 6 (six) hours as needed for moderate pain.   vitamin B-12 1000 MCG tablet Commonly known as:  CYANOCOBALAMIN Take 1,000 mcg by mouth daily.        [facility] Discharge Instructions Carotid  Endarterectomy (CEA)  Please refer to the following instructions for your post-procedure care. Your surgeon or physician assistant will discuss any changes with you.  Activity  You are encouraged to walk as much as you can. You can slowly return to normal activities but must avoid strenuous activity and heavy lifting until your doctor tell you it's OK. Avoid activities such as vacuuming or swinging a golf club. You  can drive after one week if you are comfortable and you are no longer taking prescription pain medications. It is normal to feel tired for serval weeks after your surgery. It is also normal to have difficulty with sleep habits, eating, and bowel movements after surgery. These will go away with time.  Bathing/Showering  You may shower after you come home. Do not soak in a bathtub, hot tub, or swim until the incision heals completely.  Incision Care  Shower every day. Clean your incision with mild soap and water. Pat the area dry with a clean towel. You do not need a bandage unless otherwise instructed. Do not apply any ointments or creams to your incision. You may have skin glue on your incision. Do not peel it off. It will come off on its own in about one week. Your incision may feel thickened and raised for several weeks after your surgery. This is normal and the skin will soften over time. For Men Only: It's OK to shave around the incision but do not shave the incision itself for 2 weeks. It is common to have numbness under your chin that could last for several months.  Diet  Resume your normal diet. There are no special food restrictions following this procedure. A low fat/low cholesterol diet is recommended for all patients with vascular disease. In order to heal from your surgery, it is CRITICAL to get adequate nutrition. Your body requires vitamins, minerals, and protein. Vegetables are the best source of vitamins and minerals. Vegetables also provide the perfect balance of  protein. Processed food has little nutritional value, so try to avoid this.  Medications  Resume taking all of your medications unless your doctor or physician assistant tells you not to.  If your incision is causing pain, you may take over-the- counter pain relievers such as acetaminophen (Tylenol). If you were prescribed a stronger pain medication, please be aware these medications can cause nausea and constipation.  Prevent nausea by taking the medication with a snack or meal. Avoid constipation by drinking plenty of fluids and eating foods with a high amount of fiber, such as fruits, vegetables, and grains. Do not take Tylenol if you are taking prescription pain medications.  Follow Up  Our office will schedule a follow up appointment 2-3 weeks following discharge.  Please call us immediately for any of the following conditions  . Increased pain, redness, drainage (pus) from your incision site. . Fever of 101 degrees or higher. . If you should develop stroke (slurred speech, difficulty swallowing, weakness on one side of your body, loss of vision) you should call 911 and go to the nearest emergency room. .  Reduce your risk of vascular disease:  . Stop smoking. If you would like help call [facility] at 1-800-QUIT-NOW (9518171907) or [place] at (434)662-9618. . Manage your cholesterol . Maintain a desired weight . Control your diabetes . Keep your blood pressure down .  If you have any questions, please call the office at [phone].  Prescriptions given: Roxicodone #6 No Refill  Disposition: home  Patient's condition: is Good  Follow up: 1. Dr. Chestine Spore in 3-4 weeks with carotid duplex   Doreatha Massed, PA-C [facility] [phone]   --- For [facility] use ---   Modified Rankin score at D/C (0-6): 1  IV medication needed for:  1. Hypertension: No 2. Hypotension: No  Post-op Complications: No  1. Post-op CVA or TIA: No  If yes: Event  classification (right eye,  left eye, right cortical, left cortical, verterobasilar, other): n/a  If yes: Timing of event (intra-op, <6 hrs post-op, >=6 hrs post-op, unknown): n/a  2. CN injury: No  If yes: CN n/a injuried   3. Myocardial infarction: No  If yes: Dx by (EKG or clinical, Troponin): n/a  4.  CHF: No  5.  Dysrhythmia (new): No  6. Wound infection: No  7. Reperfusion symptoms: No  8. Return to OR: No  If yes: return to OR for (bleeding, neurologic, other CEA incision, other): n/a  Discharge medications: Statin use:  Yes ASA use:  Yes   Beta blocker use:  Yes ACE-Inhibitor use:  No  ARB use:  No CCB use: No P2Y12 Antagonist use: Yes, [x ] Plavix, [ ]  Plasugrel, [ ]  Ticlopinine, [ ]  Ticagrelor, [ ]  Other, [ ]  No for medical reason, [ ]  Non-compliant, [ ]  Not-indicated Anti-coagulant use:  No, [ ]  Warfarin, [ ]  Rivaroxaban, [ ]  Dabigatran,

## 2017-12-14 NOTE — Progress Notes (Signed)
12/14/2017 1100 Pt ambulated with rolling walker 200 ft on RA, O2 levels between 96-98%, tolerated well. Kathryne Hitch

## 2017-12-14 NOTE — Progress Notes (Signed)
[facility]  Subjective  - POD#1 s/p Right TCAR.  Some mild confusion overnight, otherwise no issues.   Objective (!) 138/55 62 97.8 F (36.6 C) (Oral) 18 (!) 0%  Intake/Output Summary (Last 24 hours) at 12/14/2017 0752 Last data filed at 12/14/2017 0448 Gross per 24 hour  Intake 3191.25 ml  Output 700 ml  Net 2491.25 ml    PE General: resting on side of bed Neuro: No focal deficits, grip and plantar flexion 5/5 bilaterally, CN II-XII grossly intact except for slight asymmetric smile that was noted pre-op  Laboratory Lab Results: Recent Labs    12/13/17 1053 12/13/17 1201 12/14/17 0442  WBC 6.6  --  8.5  HGB 11.6* 9.9* 9.5*  HCT 35.0* 29.0* 28.5*  PLT 100*  --  82*   BMET Recent Labs    12/13/17 1053 12/13/17 1201 12/14/17 0442  NA 142 143 140  K 4.1 3.9 4.2  CL 109  --  111  CO2 24  --  22  GLUCOSE 104*  --  138*  BUN 17  --  18  CREATININE 0.82  --  0.76  CALCIUM 9.2  --  8.7*    COAG Lab Results  Component Value Date   INR 1.06 12/13/2017   INR 1.52 (H) 03/31/2011   INR 0.98 03/29/2011   No results found for: PTT  Assessment/Planning:  Will d/c condom cath.  OOB and walk in hallway.  If mild confusion improves this am will plan for d/c home.  Continue aspirin, plavix, statin.  Follow-up 4 weeks with bilateral carotid duplex.  Right neck ok.  [name] 12/14/2017 7:52 AM --  [name]

## 2017-12-14 NOTE — Care Management Note (Signed)
Case Management Note Donn Pierini RN, BSN Unit 4E- RN Care Coordinator  [phone]  Patient Details  Name: Matthew Bridges MRN: [id number] Date of Birth: 25-Oct-1930  Subjective/Objective:    Pt admitted s/p right TCAR                Action/Plan: PTA pt lived at home with spouse, anticipate home later today, notified by Tiffany with [facility] that they have pre-op referral for [facility] services- will f/u post discharge with pt at home. CM will notify [facility] of pt's transition home.   Expected Discharge Date:                  Expected Discharge Plan:  Home w Home Health Services  In-House Referral:  NA  Discharge planning Services  CM Consult  Post Acute Care Choice:  Home Health Choice offered to:     DME Arranged:    DME Agency:     HH Arranged:    HH Agency:  [facility]  Status of Service:  Completed, signed off  If discussed at Long Length of Stay Meetings, dates discussed:    Discharge Disposition: home/home health   Additional Comments:  Darrold Span, RN 12/14/2017, 10:42 AM

## 2017-12-14 NOTE — Progress Notes (Signed)
12/14/2017 3:04 PM Discharge AVS meds taken today and those due this evening reviewed.  Follow-up appointments and when to call md reviewed.  D/C IV and TELE.  Questions and concerns addressed.   D/C home per orders. Kathryne Hitch

## 2017-12-20 ENCOUNTER — Encounter

## 2017-12-20 ENCOUNTER — Other Ambulatory Visit: Payer: Self-pay

## 2017-12-20 ENCOUNTER — Encounter: Payer: Medicare HMO | Admitting: Vascular Surgery

## 2017-12-20 DIAGNOSIS — I6523 Occlusion and stenosis of bilateral carotid arteries: Secondary | ICD-10-CM

## 2018-01-03 ENCOUNTER — Ambulatory Visit: Payer: Medicare HMO | Admitting: Cardiovascular Disease

## 2018-01-04 ENCOUNTER — Ambulatory Visit: Payer: Medicare HMO | Admitting: Cardiovascular Disease

## 2018-01-07 ENCOUNTER — Ambulatory Visit: Payer: Medicare HMO | Admitting: Cardiology

## 2018-01-07 ENCOUNTER — Encounter: Payer: Self-pay | Admitting: Cardiology

## 2018-01-07 VITALS — BP 146/72 | HR 71 | Ht 70.0 in | Wt 199.1 lb

## 2018-01-07 DIAGNOSIS — R0609 Other forms of dyspnea: Secondary | ICD-10-CM

## 2018-01-07 DIAGNOSIS — D649 Anemia, unspecified: Secondary | ICD-10-CM

## 2018-01-07 DIAGNOSIS — I2510 Atherosclerotic heart disease of native coronary artery without angina pectoris: Secondary | ICD-10-CM | POA: Diagnosis not present

## 2018-01-07 NOTE — Patient Instructions (Signed)
Medication Instructions:   Your physician recommends that you continue on your current medications as directed. Please refer to the Current Medication list given to you today.   If you need a refill on your cardiac medications before your next appointment, please call your pharmacy.  Labwork: CBC AND BMET    Testing/Procedures: NONE ORDERED  TODAY    Follow-Up:  WILL BE CONTACTED BACK  FOR APPOINTMENT TIME    Any Other Special Instructions Will Be Listed Below (If Applicable).

## 2018-01-07 NOTE — Progress Notes (Signed)
Cardiology Office Note   Date:  01/07/2018   ID:  Matthew Bridges, DOB October 27, 1930, MRN [id number]  PCP:  Henri Medal, MD  Cardiologist:  Dr. Eden Emms    Chief Complaint  Patient presents with  . Coronary Artery Disease      History of Present Illness: Matthew Bridges is a 84 y.o. adult who presents for CAD.  He is post hospitalization for his Rt carotid.   High-grade right internal carotid artery stenosis >90% with string sign.  Lesion length was 29 mm.  Predilation with a 5 mm x 20 mm Sterling.  Delivery of a 8 mm x 40 mm Enroute trans-carotid stent.  No evidence of residual stenosis after pre-dilation and stent deployment. The ECA appears chronically occluded.  Transcatheter placement ofrightcervical carotid artery stent including angioplasty (right trans-carotid artery revascularization with flow reversal neuroprotection)    Pt with hx of CABG in 2013 and postoperative atrial fibrillation who is seen as an urgent visit given findings on his carotid exam today.  He presented for routine carotid artery ultrasounds. His prior exams had been stable. However, today his studies showed progression of his bilateral disease to 80-99% stenosis on the right side and around 80% stenosis on the left side. Patient and his wife wished to be seen today to discuss results.  His main concern is his chronic dizziness. This has been investigated by EP (Dr. Graciela Husbands), ENT, and neurology without clear cause. Per Dr. Fabio Bering last note, this may be due to his chronic lorazepam.  We did do a nuc study which was neg for ischemia.  Done for dyspnea.    Dizziness may be the same.  He still has some DOE.  No edema today.  He does complain Lt arm tremors which he has had.  But his wife believes it to have increased.  They see vascular next week.  May need neuro consult.   They would like to change to office closer to [facility].     Past Medical History:  Diagnosis Date  . Abnormal weight  loss 06/13/2011   Overview:  Last Assessment & Plan:  Likely related to postop decreased appetite, may be improving, check tsh,  to f/u any worsening symptoms or concerns  . Abnormality of gait and mobility 03/16/2015  . Alcohol abuse   . Anemia   . Anxiety   . Arthritis   . Arthritis of right hip 01/26/2014   Ultrasound guided injection of the right hip 01/26/2014   . ASCVD (arteriosclerotic cardiovascular disease) 03/16/2015  . Asthmatic bronchitis   . B12 deficiency 01/01/2012  . Bilateral hearing loss 06/07/2015  . Bilateral impacted cerumen 06/07/2015  . Blood transfusion   . BPH (benign prostatic hyperplasia) 10/02/2011  . Bronchiectasis   . Carotid artery occlusion   . Carotid disease, bilateral (HCC) 09/20/2011  . Carotid stenosis 12/13/2017  . Chest pain on exertion 01/25/2011   ? Etiology. Referral to Cardiology made 01/25/11   . Chronic bilateral low back pain with right-sided sciatica 03/16/2015  . Chronic constipation 07/09/2012   Overview:  Last Assessment & Plan:  Sitz marks has shown inertia-like findings Colonoscopy 2011 for same problem negative (polyps) Last colonoscopy  Will give MiraLax purge - sans bisacodyl Increase MiraLax to 2 doses daily Try daily colchicine 0.6 mg qd F/U Prn  . Chronic low back pain 10/02/2011  . Chronic right hip pain 07/10/2013  . Chronic right shoulder pain 02/14/2012  . CONSTIPATION 02/21/2010   Qualifier: Diagnosis  of  By: Myrtie Hawk, Amy S   . COPD (chronic obstructive pulmonary disease) (HCC)    sees Dr/ Delford Field, saw last Dec 2012  . Coronary artery disease    sees Dr. Eden Emms, saw 1 month ago  . Disorder of artery or arteriole (HCC) 09/20/2011   Overview:  Last Assessment & Plan:  Known moderate bilateral disease no TIA  F/u duplex in 6 months Reviewed [place] from today and no change   . Diverticulosis    Colonoscopy 2007/Dr. Leone Payor  . Diverticulosis of large intestine 03/16/2015  . DIVERTICULOSIS-COLON 02/21/2010   Qualifier: Diagnosis of   By: Monica Becton PA-c, Amy S   . Dizziness 01/10/2011  . Elevated PSA 03/16/2015  . Emotional lability 10/02/2011  . Gait disorder 02/14/2012  . Gastroesophageal reflux disease without esophagitis 03/16/2015  . Generalized anxiety disorder 03/16/2015  . GERD 12/04/2006   Qualifier: Diagnosis of  By: Delford Field MD, Charlcie Cradle   . GERD (gastroesophageal reflux disease)   . H/O hiatal hernia   . Hearing loss of right ear 03/30/2012   Overview:  Last Assessment & Plan:  Improved with irrigation,  to f/u any worsening symptoms or concerns  . Hx of CABG 05/23/2011  . Hx of colonic polyp    tubular adenoma (05/16/2009), hyperplastic polyp  . HX OF GALLSTONE 05/21/2007   Qualifier: History of  By: Dorian Pod, Pam    . Hyperlipidemia   . Hyperopia with astigmatism and presbyopia, bilateral 07/19/2017  . Hypersensitivity pneumonitis with associated bronchiectasis 10/11/2010   Hypersensitivity pneumonitis in setting of bronchiectasis.(09/26/10) Neg FOB for pathogens or CA 7/12 Mold found in home RAST assay pos,  Aspergillous, IgE 330 ONO RA 01/26/11: normal      . Imbalance 08/21/2017  . Impaired glucose tolerance 06/13/2011  . Insomnia   . Ischemic foot pain at rest [facility]) 03/23/2017  . Left carpal tunnel syndrome 07/10/2013  . Lightheaded 06/07/2015  . Lumbar disc disease 10/02/2011  . Nephrolithiasis   . NEPHROLITHIASIS, HX OF 05/21/2007   Qualifier: History of  By: Dorian Pod, Pam    . 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 Nondependent alcohol abuse, in remission 05/21/2007   Qualifier: Diagnosis of  By: Dorian Pod, Pam    . Olecranon bursitis of right elbow 07/10/2013  . Orthostatic hypotension 03/29/2012  . Osteopenia 01/01/2012  . Other chronic pain 05/21/2007   Qualifier: Diagnosis of  By: Dorian Pod, Pam    . PAD (peripheral artery disease) (HCC) 03/23/2017  . Paroxysmal atrial fibrillation (HCC) 05/23/2011  . Peripheral edema 07/09/2012  . PERSONAL HX COLONIC POLYPS 02/21/2010    Qualifier: Diagnosis of  By: Monica Becton PA-c, Amy S   . Pleurisy   . Pneumonia   . Preventative health care 12/11/2010  . Primary osteoarthritis of right hip 03/16/2015  . Pulmonary fibrosis (HCC) 07/10/2013  . Rash 10/25/2015  . Rash and nonspecific skin eruption 01/12/2013  . Referred otalgia of left ear 08/21/2017  . Right arm pain 04/24/2012  . Right lateral epicondylitis 02/14/2012  . Rotator cuff arthropathy 07/22/2013  . S/P CABG (coronary artery bypass graft)   . Shortness of breath   . 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 Thrombocytopenia (HCC) 02/09/2015  . Tremor 05/23/2011  . Unsteadiness 07/26/2015  . Urinary incontinence 03/16/2015  . Vitamin B12 deficiency 01/01/2012  . Weakness 09/15/2015  . Weight loss 06/13/2011    Past Surgical History:  Procedure Laterality Date  . CARDIAC CATHETERIZATION  Friday 4, 2013  . CATARACT EXTRACTION     bilateral  . COLONOSCOPY     multiple  . CORONARY ARTERY BYPASS GRAFT  03/31/2011   Procedure: CORONARY ARTERY BYPASS GRAFTING (CABG);  Surgeon: [name], MD;  Location: [facility] OR;  Service: Open Heart Surgery;  Laterality: N/A;  Times three using the mammary and right leg greater saphenous vein.   Marland Kitchen KIDNEY STONE SURGERY     removal of kidney stone  . TONSILLECTOMY    . TRANSCAROTID ARTERY REVASCULARIZATION Right 12/13/2017   Procedure: RIGHT TRANSCAROTID ARTERY REVASCULARIZATION;  Surgeon: [name], MD;  Location: [facility] OR;  Service: Vascular;  Laterality: Right;     Current Outpatient Medications  Medication Sig Dispense Refill  . albuterol (PROVENTIL HFA;VENTOLIN HFA) 108 (90 Base) MCG/ACT inhaler Inhale 2 puffs into the lungs every 6 (six) hours as needed for wheezing or shortness of breath. 1 Inhaler 0  . aspirin 81 MG tablet Take 81 mg by mouth daily.    Marland Kitchen atorvastatin (LIPITOR) 20 MG tablet Take 1 tablet (20 mg total) by mouth daily. 90 tablet 3  . clopidogrel (PLAVIX) 75 MG tablet Take 1 tablet (75 mg total)  by mouth daily. 30 tablet 11  . clotrimazole-betamethasone (LOTRISONE) cream Apply 1 application topically 2 (two) times daily as needed (rash).    . furosemide (LASIX) 20 MG tablet Take 20 mg by mouth daily as needed for fluid or edema.    Marland Kitchen guaiFENesin (MUCINEX) 600 MG 12 hr tablet Take 2 tablets (1,200 mg total) by mouth 2 (two) times daily as needed for cough or to loosen phlegm.    Marland Kitchen LORazepam (ATIVAN) 0.5 MG tablet TAKE ONE TABLET BY MOUTH TWICE DAILY AS NEEDED FOR ANXIETY 60 tablet 5  . metoprolol tartrate (LOPRESSOR) 25 MG tablet TAKE ONE TABLET BY MOUTH TWICE DAILY 60 tablet 11  . omega-3 acid ethyl esters (LOVAZA) 1 g capsule Take 1 g by mouth daily.    Marland Kitchen omeprazole (PRILOSEC) 20 MG capsule TAKE ONE CAPSULE BY MOUTH ONCE DAILY 30 capsule 1  . vitamin B-12 (CYANOCOBALAMIN) 1000 MCG tablet Take 1,000 mcg by mouth daily.    Marland Kitchen oxybutynin (DITROPAN) 5 MG tablet Take 5 mg by mouth 2 (two) times daily.     No current facility-administered medications for this visit.     Allergies:   Penicillins; Alprazolam; Codeine; Tramadol; and Bisacodyl    Social History:  The patient  reports that he quit smoking about 61 years ago. His smoking use included cigarettes. He has a 10.00 pack-year smoking history. He quit smokeless tobacco use about 69 years ago.  His smokeless tobacco use included snuff and chew. He reports that he does not drink alcohol or use drugs.   Family History:  The patient's family history includes Alcohol abuse in his father; Breast cancer in his sister; Colon cancer in his brother and father; Heart disease in his mother, other, and sister; Lung cancer in his brother.    ROS:  General:no colds or fevers, no weight changes Skin:no rashes or ulcers HEENT:no blurred vision, no congestion CV:see HPI PUL:see HPI GI:no diarrhea constipation or melena, no indigestion GU:no hematuria, no dysuria MS:no joint pain, no claudication Neuro:no syncope, no lightheadedness Endo:no  diabetes, no thyroid disease Wt Readings from Last 3 Encounters:  01/07/18 199 lb 1.9 oz (90.3 kg)  12/13/17 200 lb 9.6 oz (91 kg)  11/27/17 200 lb 9.6 oz (91 kg)     PHYSICAL EXAM: VS:  BP (!) 146/72   Pulse 71   Ht 5\' 10"  (1.778 m)   Wt 199 lb 1.9 oz (90.3 kg)   SpO2 97%   BMI 28.57 kg/m  , BMI Body mass index is 28.57 kg/m. General:Pleasant affect, NAD Skin:Warm and dry, brisk capillary refill HEENT:normocephalic, sclera clear, mucus membranes moist Neck:supple, no JVD, no bruits  Heart:S1S2 RRR without murmur, gallup, rub or click Lungs:clear without rales, rhonchi, or wheezes ZOX:WRUE, non tender, + BS, do not palpate liver spleen or masses Ext:no lower ext edema, 2+ pedal pulses, 2+ radial pulses Neuro:alert and oriented, MAE, follows commands, + facial symmetry    EKG:  EKG is NOT ordered today.    Recent Labs: 12/13/2017: ALT 16 12/14/2017: BUN 18; Creatinine, Ser 0.76; Hemoglobin 9.5; Platelets 82; Potassium 4.2; Sodium 140    Lipid Panel    Component Value Date/Time   CHOL 204 (H) 01/09/2013 1437   TRIG 116.0 01/09/2013 1437   HDL 53.20 01/09/2013 1437   CHOLHDL 4 01/09/2013 1437   VLDL 23.2 01/09/2013 1437   LDLDIRECT 140.9 01/09/2013 1437       Other studies Reviewed: Additional studies/ records that were reviewed today include: Marland Kitchen GXT Lexiscan  Nuclear stress EF: 56%.  Probable normal perfusion and mild soft tissue attenuation. No significant ischemia or scar.  LVEF 56%   Echo 12/12/12 Study Conclusions  - Left ventricle: The cavity size was normal. Wall thickness was increased in a pattern of mild LVH. Systolic function was normal. The estimated ejection fraction was in the range of 55% to 65%. Wall motion was normal; there were no regional wall motion abnormalities. Doppler parameters are consistent with abnormal left ventricular relaxation (grade 1 diastolic dysfunction). - Atrial septum: No defect or patent foramen ovale  was identified.  ASSESSMENT AND PLAN:  1.  CAD with hx of CABG 2013 no angina, no wheezes and recent nuc was neg.  Will have him follow up in 6 months.    2.  Carotid stenosis now s/p High-grade right internal carotid artery stenosis >90% with string sign.  Lesion length was 29 mm.  Predilation with a 5 mm x 20 mm Sterling.  Delivery of a 8 mm x 40 mm Enroute trans-carotid stent.  No evidence of residual stenosis after pre-dilation and stent deployment. The ECA appears chronically occluded.  Transcatheter placement ofrightcervical carotid artery stent including angioplasty (right trans-carotid artery revascularization with flow reversal neuroprotection)   3.  PAF in SR by exam.  No need for anticoagulation.  4.  Hx of asthma, has albuterol inhaler.   5.  Anemia post op.  Will check CBC. And BMP    Current medicines are reviewed with the patient today.  The patient Has no concerns regarding medicines.  The following changes have been made:  See above Labs/ tests ordered today include:see above  Disposition:   FU:  see above  Signed, Nada Boozer, NP  01/07/2018 4:00 PM    [facility] [address] [place], [place], [place]  [address]/ [address] [place], [place] Phone: [phone]; Fax: [phone]  [phone]

## 2018-01-08 LAB — BASIC METABOLIC PANEL
BUN / CREAT RATIO: 16 (ref 10–24)
BUN: 14 mg/dL (ref 8–27)
CO2: 20 mmol/L (ref 20–29)
Calcium: 9.7 mg/dL (ref 8.6–10.2)
Chloride: 104 mmol/L (ref 96–106)
Creatinine, Ser: 0.89 mg/dL (ref 0.76–1.27)
GFR calc Af Amer: 90 mL/min/{1.73_m2} (ref >59)
GFR calc non Af Amer: 78 mL/min/{1.73_m2} (ref >59)
GLUCOSE: 96 mg/dL (ref 65–99)
Potassium: 4 mmol/L (ref 3.5–5.2)
SODIUM: 142 mmol/L (ref 134–144)

## 2018-01-08 LAB — CBC
HEMATOCRIT: 30.2 % — AB (ref 37.5–51.0)
Hemoglobin: 10.2 g/dL — ABNORMAL LOW (ref 13.0–17.7)
MCH: 34.7 pg — ABNORMAL HIGH (ref 26.6–33.0)
MCHC: 33.8 g/dL (ref 31.5–35.7)
MCV: 103 fL — ABNORMAL HIGH (ref 79–97)
PLATELETS: 92 10*3/uL — AB (ref 150–450)
RBC: 2.94 x10E6/uL — ABNORMAL LOW (ref 4.14–5.80)
RDW: 13.1 % (ref 12.3–15.4)
WBC: 8.3 10*3/uL (ref 3.4–10.8)

## 2018-01-15 ENCOUNTER — Ambulatory Visit (INDEPENDENT_AMBULATORY_CARE_PROVIDER_SITE_OTHER): Payer: Medicare HMO | Admitting: Vascular Surgery

## 2018-01-15 ENCOUNTER — Other Ambulatory Visit: Payer: Self-pay

## 2018-01-15 ENCOUNTER — Ambulatory Visit (HOSPITAL_COMMUNITY)
Admission: RE | Admit: 2018-01-15 | Discharge: 2018-01-15 | Disposition: A | Discharge status: Home / Self Care | Payer: Medicare HMO | Source: Ambulatory Visit | Attending: Vascular Surgery | Admitting: Vascular Surgery

## 2018-01-15 ENCOUNTER — Encounter: Payer: Self-pay | Admitting: Vascular Surgery

## 2018-01-15 VITALS — BP 146/58 | HR 66 | Temp 97.1°F | Resp 18 | Ht 66.0 in | Wt 195.0 lb

## 2018-01-15 DIAGNOSIS — I6523 Occlusion and stenosis of bilateral carotid arteries: Secondary | ICD-10-CM

## 2018-01-15 NOTE — Progress Notes (Signed)
Patient name: Matthew Bridges MRN: [id number] DOB: 10/13/1930 Sex: adult  REASON FOR VISIT:   HPI: Matthew Bridges is a 84 y.o. adult who presents for one-month follow-up status post right trans-carotid artery revascularization (TCAR procedure) on 12/13/17 for high-grade asymptomatic stenosis near string sign.  Patient has recovered well at home - although son said it was a little difficult at first.   No new neurologic symptoms including weakness numbness tingling or other concern.  He is taking aspirin Plavix and statin.  Neck incision has healed well without issue.   Past Medical History:  Diagnosis Date  . Abnormal weight loss 06/13/2011   Overview:  Last Assessment & Plan:  Likely related to postop decreased appetite, may be improving, check tsh,  to f/u any worsening symptoms or concerns  . Abnormality of gait and mobility 03/16/2015  . Alcohol abuse   . Anemia   . Anxiety   . Arthritis   . Arthritis of right hip 01/26/2014   Ultrasound guided injection of the right hip 01/26/2014   . ASCVD (arteriosclerotic cardiovascular disease) 03/16/2015  . Asthmatic bronchitis   . B12 deficiency 01/01/2012  . Bilateral hearing loss 06/07/2015  . Bilateral impacted cerumen 06/07/2015  . Blood transfusion   . BPH (benign prostatic hyperplasia) 10/02/2011  . Bronchiectasis   . Carotid artery occlusion   . Carotid disease, bilateral (HCC) 09/20/2011  . Carotid stenosis 12/13/2017  . Chest pain on exertion 01/25/2011   ? Etiology. Referral to Cardiology made 01/25/11   . Chronic bilateral low back pain with right-sided sciatica 03/16/2015  . Chronic constipation 07/09/2012   Overview:  Last Assessment & Plan:  Sitz marks has shown inertia-like findings Colonoscopy 2011 for same problem negative (polyps) Last colonoscopy  Will give MiraLax purge - sans bisacodyl Increase MiraLax to 2 doses daily Try daily colchicine 0.6 mg qd F/U Prn  . Chronic low back pain 10/02/2011  . Chronic right hip pain  07/10/2013  . Chronic right shoulder pain 02/14/2012  . CONSTIPATION 02/21/2010   Qualifier: Diagnosis of  By: Myrtie Hawk, Amy S   . COPD (chronic obstructive pulmonary disease) (HCC)    sees Dr/ Delford Field, saw last Dec 2012  . Coronary artery disease    sees Dr. Eden Emms, saw 1 month ago  . Disorder of artery or arteriole (HCC) 09/20/2011   Overview:  Last Assessment & Plan:  Known moderate bilateral disease no TIA  F/u duplex in 6 months Reviewed [place] from today and no change   . Diverticulosis    Colonoscopy 2007/Dr. Leone Payor  . Diverticulosis of large intestine 03/16/2015  . DIVERTICULOSIS-COLON 02/21/2010   Qualifier: Diagnosis of  By: Monica Becton PA-c, Amy S   . Dizziness 01/10/2011  . Elevated PSA 03/16/2015  . Emotional lability 10/02/2011  . Gait disorder 02/14/2012  . Gastroesophageal reflux disease without esophagitis 03/16/2015  . Generalized anxiety disorder 03/16/2015  . GERD 12/04/2006   Qualifier: Diagnosis of  By: Delford Field MD, Charlcie Cradle   . GERD (gastroesophageal reflux disease)   . H/O hiatal hernia   . Hearing loss of right ear 03/30/2012   Overview:  Last Assessment & Plan:  Improved with irrigation,  to f/u any worsening symptoms or concerns  . Hx of CABG 05/23/2011  . Hx of colonic polyp    tubular adenoma (05/16/2009), hyperplastic polyp  . HX OF GALLSTONE 05/21/2007   Qualifier: History of  By: Dorian Pod, Pam    . Hyperlipidemia   . Hyperopia with  astigmatism and presbyopia, bilateral 07/19/2017  . Hypersensitivity pneumonitis with associated bronchiectasis 10/11/2010   Hypersensitivity pneumonitis in setting of bronchiectasis.(09/26/10) Neg FOB for pathogens or CA 7/12 Mold found in home RAST assay pos,  Aspergillous, IgE 330 ONO RA 01/26/11: normal      . Imbalance 08/21/2017  . Impaired glucose tolerance 06/13/2011  . Insomnia   . Ischemic foot pain at rest [facility]) 03/23/2017  . Left carpal tunnel syndrome 07/10/2013  . Lightheaded 06/07/2015  . Lumbar disc disease 10/02/2011  .  Nephrolithiasis   . NEPHROLITHIASIS, HX OF 05/21/2007   Qualifier: History of  By: Dorian Pod, Pam    . 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 Nondependent alcohol abuse, in remission 05/21/2007   Qualifier: Diagnosis of  By: Dorian Pod, Pam    . Olecranon bursitis of right elbow 07/10/2013  . Orthostatic hypotension 03/29/2012  . Osteopenia 01/01/2012  . Other chronic pain 05/21/2007   Qualifier: Diagnosis of  By: Dorian Pod, Pam    . PAD (peripheral artery disease) (HCC) 03/23/2017  . Paroxysmal atrial fibrillation (HCC) 05/23/2011  . Peripheral edema 07/09/2012  . PERSONAL HX COLONIC POLYPS 02/21/2010   Qualifier: Diagnosis of  By: Monica Becton PA-c, Amy S   . Pleurisy   . Pneumonia   . Preventative health care 12/11/2010  . Primary osteoarthritis of right hip 03/16/2015  . Pulmonary fibrosis (HCC) 07/10/2013  . Rash 10/25/2015  . Rash and nonspecific skin eruption 01/12/2013  . Referred otalgia of left ear 08/21/2017  . Right arm pain 04/24/2012  . Right lateral epicondylitis 02/14/2012  . Rotator cuff arthropathy 07/22/2013  . S/P CABG (coronary artery bypass graft)   . Shortness of breath   . 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 Thrombocytopenia (HCC) 02/09/2015  . Tremor 05/23/2011  . Unsteadiness 07/26/2015  . Urinary incontinence 03/16/2015  . Vitamin B12 deficiency 01/01/2012  . Weakness 09/15/2015  . Weight loss 06/13/2011    Past Surgical History:  Procedure Laterality Date  . CARDIAC CATHETERIZATION     Friday 4, 2013  . CATARACT EXTRACTION     bilateral  . COLONOSCOPY     multiple  . CORONARY ARTERY BYPASS GRAFT  03/31/2011   Procedure: CORONARY ARTERY BYPASS GRAFTING (CABG);  Surgeon: [name], MD;  Location: [facility] OR;  Service: Open Heart Surgery;  Laterality: N/A;  Times three using the mammary and right leg greater saphenous vein.   Marland Kitchen KIDNEY STONE SURGERY     removal of kidney stone  . TONSILLECTOMY    . TRANSCAROTID ARTERY  REVASCULARIZATION Right 12/13/2017   Procedure: RIGHT TRANSCAROTID ARTERY REVASCULARIZATION;  Surgeon: [name], MD;  Location: [facility] OR;  Service: Vascular;  Laterality: Right;    Family History  Problem Relation Age of Onset  . Heart disease Mother   . Colon cancer Father   . Alcohol abuse Father   . Heart disease Other        maternal side   . Colon cancer Brother   . Lung cancer Brother   . Breast cancer Sister   . Heart disease Sister     SOCIAL HISTORY: Social History   Tobacco Use  . Smoking status: Former Smoker    Packs/day: 1.00    Years: 10.00    Pack years: 10.00    Types: Cigarettes    Last attempt to quit: 03/20/1956    Years since quitting: 61.8  . Smokeless tobacco: Former Neurosurgeon  Types: Snuff, Chew    Quit date: 03/20/1948  Substance Use Topics  . Alcohol use: No    Allergies  Allergen Reactions  . Penicillins Hives and Rash    Has patient had a PCN reaction causing immediate rash, facial/tongue/throat swelling, SOB or lightheadedness with hypotension: Unknown Has patient had a PCN reaction causing severe rash involving mucus membranes or skin necrosis: Unknown Has patient had a PCN reaction that required hospitalization: Unknown Has patient had a PCN reaction occurring within the last 10 years: Unknown If all of the above answers are "NO", then may proceed with Cephalosporin use.   . Alprazolam Other (See Comments)    Confusion, weakness Confusion, weakness Confusion, weakness Confusion, weakness  . Codeine Other (See Comments)    Makes him feel dizzy, out of it Makes him feel dizzy, out of it  . Tramadol Other (See Comments)    Zombie state Zombie state Zombie state  . Bisacodyl Nausea And Vomiting    Makes stomach hurt  Makes stomach hurt Makes stomach hurt    Current Outpatient Medications  Medication Sig Dispense Refill  . albuterol (PROVENTIL HFA;VENTOLIN HFA) 108 (90 Base) MCG/ACT inhaler Inhale 2 puffs into the lungs  every 6 (six) hours as needed for wheezing or shortness of breath. 1 Inhaler 0  . aspirin 81 MG tablet Take 81 mg by mouth daily.    Marland Kitchen atorvastatin (LIPITOR) 20 MG tablet Take 1 tablet (20 mg total) by mouth daily. 90 tablet 3  . clopidogrel (PLAVIX) 75 MG tablet Take 1 tablet (75 mg total) by mouth daily. 30 tablet 11  . clotrimazole-betamethasone (LOTRISONE) cream Apply 1 application topically 2 (two) times daily as needed (rash).    . furosemide (LASIX) 20 MG tablet Take 20 mg by mouth daily as needed for fluid or edema.    Marland Kitchen guaiFENesin (MUCINEX) 600 MG 12 hr tablet Take 2 tablets (1,200 mg total) by mouth 2 (two) times daily as needed for cough or to loosen phlegm.    Marland Kitchen LORazepam (ATIVAN) 0.5 MG tablet TAKE ONE TABLET BY MOUTH TWICE DAILY AS NEEDED FOR ANXIETY 60 tablet 5  . metoprolol tartrate (LOPRESSOR) 25 MG tablet TAKE ONE TABLET BY MOUTH TWICE DAILY 60 tablet 11  . omega-3 acid ethyl esters (LOVAZA) 1 g capsule Take 1 g by mouth daily.    Marland Kitchen omeprazole (PRILOSEC) 20 MG capsule TAKE ONE CAPSULE BY MOUTH ONCE DAILY 30 capsule 1  . oxybutynin (DITROPAN) 5 MG tablet Take 5 mg by mouth 2 (two) times daily.    . vitamin B-12 (CYANOCOBALAMIN) 1000 MCG tablet Take 1,000 mcg by mouth daily.     No current facility-administered medications for this visit.     PHYSICAL EXAM: Vitals:   01/15/18 1621 01/15/18 1624  BP: (!) 142/69 (!) 146/58  Pulse: 65 66  Resp: 18   Temp: (!) 97.1 F (36.2 C)   TempSrc: Oral   SpO2: 100%   Weight: 88.5 kg   Height: 5\' 6"  (1.676 m)     GENERAL: The patient is a well-nourished adult, in no acute distress. The vital signs are documented above. VASCULAR:  2+ carotid pulse bilateral Neck incision right neck healing without issue PULMONARY: There is good air exchange bilaterally without wheezing or rales. NEUROLOGIC: No focal weakness or paresthesias are detected.  CN II-XII grossly intact. SKIN: There are no ulcers or rashes noted. PSYCHIATRIC: The  patient has a normal affect.  DATA:   I independently reviewed his noninvasive imaging  and his right carotid stent is widely patent with no elevated velocities.  He has a moderate stenosis of his left ICA with velocities of 380/83.  Assessment/Plan:  84 year old male now one month status post right trans-carotid artery revascularization with carotid stent (TCAR).  Overall he is doing well today and his right carotid stent is widely patent on duplex.  He does have a moderate stenosis of his left ICA but I think we should just continue observation given the difficulty he had overcoming anesthesia and that this is an asymptomatic lesion.  Given the moderate stenosis on the other side I have recommended continuing aspirin Plavix and statin for now.  I will see him back in 6 months with another carotid duplex.   [name], MD [facility] Office: [phone] Pager: [phone]   [name]

## 2018-01-29 ENCOUNTER — Encounter: Payer: Medicare HMO | Admitting: Vascular Surgery

## 2018-01-29 ENCOUNTER — Encounter (HOSPITAL_COMMUNITY): Payer: Medicare HMO

## 2018-02-05 ENCOUNTER — Encounter (HOSPITAL_COMMUNITY): Payer: Medicare HMO

## 2018-02-05 ENCOUNTER — Encounter: Payer: Medicare HMO | Admitting: Vascular Surgery

## 2018-03-20 ENCOUNTER — Other Ambulatory Visit: Payer: Self-pay

## 2018-03-20 ENCOUNTER — Encounter (HOSPITAL_BASED_OUTPATIENT_CLINIC_OR_DEPARTMENT_OTHER): Payer: Self-pay | Admitting: Emergency Medicine

## 2018-03-20 ENCOUNTER — Emergency Department (HOSPITAL_BASED_OUTPATIENT_CLINIC_OR_DEPARTMENT_OTHER)
Admission: EM | Admit: 2018-03-20 | Discharge: 2018-03-20 | Disposition: A | Discharge status: Home / Self Care | Payer: Medicare HMO | Attending: Emergency Medicine | Admitting: Emergency Medicine

## 2018-03-20 ENCOUNTER — Emergency Department (HOSPITAL_BASED_OUTPATIENT_CLINIC_OR_DEPARTMENT_OTHER): Payer: Medicare HMO

## 2018-03-20 DIAGNOSIS — Z87891 Personal history of nicotine dependence: Secondary | ICD-10-CM | POA: Insufficient documentation

## 2018-03-20 DIAGNOSIS — Y9389 Activity, other specified: Secondary | ICD-10-CM | POA: Insufficient documentation

## 2018-03-20 DIAGNOSIS — I480 Paroxysmal atrial fibrillation: Secondary | ICD-10-CM | POA: Insufficient documentation

## 2018-03-20 DIAGNOSIS — J449 Chronic obstructive pulmonary disease, unspecified: Secondary | ICD-10-CM | POA: Diagnosis not present

## 2018-03-20 DIAGNOSIS — I25810 Atherosclerosis of coronary artery bypass graft(s) without angina pectoris: Secondary | ICD-10-CM | POA: Insufficient documentation

## 2018-03-20 DIAGNOSIS — W19XXXA Unspecified fall, initial encounter: Secondary | ICD-10-CM | POA: Insufficient documentation

## 2018-03-20 DIAGNOSIS — S060X1A Concussion with loss of consciousness of 30 minutes or less, initial encounter: Secondary | ICD-10-CM

## 2018-03-20 DIAGNOSIS — S0083XA Contusion of other part of head, initial encounter: Secondary | ICD-10-CM

## 2018-03-20 DIAGNOSIS — Y999 Unspecified external cause status: Secondary | ICD-10-CM | POA: Insufficient documentation

## 2018-03-20 DIAGNOSIS — Z7982 Long term (current) use of aspirin: Secondary | ICD-10-CM | POA: Diagnosis not present

## 2018-03-20 DIAGNOSIS — S098XXA Other specified injuries of head, initial encounter: Secondary | ICD-10-CM | POA: Diagnosis present

## 2018-03-20 DIAGNOSIS — T148XXA Other injury of unspecified body region, initial encounter: Secondary | ICD-10-CM

## 2018-03-20 DIAGNOSIS — S060X9A Concussion with loss of consciousness of unspecified duration, initial encounter: Secondary | ICD-10-CM

## 2018-03-20 DIAGNOSIS — Z79899 Other long term (current) drug therapy: Secondary | ICD-10-CM | POA: Insufficient documentation

## 2018-03-20 DIAGNOSIS — Y929 Unspecified place or not applicable: Secondary | ICD-10-CM | POA: Insufficient documentation

## 2018-03-20 LAB — COMPREHENSIVE METABOLIC PANEL
ALBUMIN: 4 g/dL (ref 3.5–5.0)
ALT: 16 U/L (ref 0–44)
ANION GAP: 6 (ref 5–15)
AST: 18 U/L (ref 15–41)
Alkaline Phosphatase: 68 U/L (ref 38–126)
BUN: 20 mg/dL (ref 8–23)
CALCIUM: 9 mg/dL (ref 8.9–10.3)
CHLORIDE: 108 mmol/L (ref 98–111)
CO2: 24 mmol/L (ref 22–32)
Creatinine, Ser: 0.74 mg/dL (ref 0.61–1.24)
GFR calc non Af Amer: >60 mL/min (ref >60)
GLUCOSE: 103 mg/dL — AB (ref 70–99)
POTASSIUM: 4.3 mmol/L (ref 3.5–5.1)
SODIUM: 138 mmol/L (ref 135–145)
Total Bilirubin: 0.8 mg/dL (ref 0.3–1.2)
Total Protein: 6.8 g/dL (ref 6.5–8.1)

## 2018-03-20 LAB — CBC
HCT: 31.1 % — ABNORMAL LOW (ref 39.0–52.0)
HEMOGLOBIN: 9.8 g/dL — AB (ref 13.0–17.0)
MCH: 34.5 pg — AB (ref 26.0–34.0)
MCHC: 31.5 g/dL (ref 30.0–36.0)
MCV: 109.5 fL — ABNORMAL HIGH (ref 80.0–100.0)
PLATELETS: 89 10*3/uL — AB (ref 150–400)
RBC: 2.84 MIL/uL — ABNORMAL LOW (ref 4.22–5.81)
RDW: 13.8 % (ref 11.5–15.5)
WBC: 8.1 10*3/uL (ref 4.0–10.5)
nRBC: 0 % (ref 0.0–0.2)

## 2018-03-20 MED ORDER — KETOROLAC TROMETHAMINE 60 MG/2ML IM SOLN
60.0000 mg | Freq: Once | INTRAMUSCULAR | Status: AC
  Administered 2018-03-20: 60 mg via INTRAMUSCULAR
  Filled 2018-03-20: qty 2

## 2018-03-20 MED ORDER — KETOROLAC TROMETHAMINE 15 MG/ML IJ SOLN: 15.0000 mg | Freq: Once | INTRAMUSCULAR | Status: DC

## 2018-03-20 NOTE — ED Provider Notes (Signed)
[facility] EMERGENCY DEPARTMENT Provider Note   CSN: [id number] Arrival date & time: 03/20/18  1224     History   Chief Complaint Chief Complaint  Patient presents with  . Loss of Consciousness    HPI Matthew Bridges is a 85 y.o. adult.  HPI   Bent down to pick up bedroom slippers and that is the last thing he remembers Larey Seat hit head, now having headache 5/10 No neck pain.  No nausea or vomiting. No change in vision, numbness or weakness Think was out for a second Denies dizziness or lightheadedness No black or bloody stools, no fevers, cough, urinary symptmos, vomiting, diarrhea, chest pain or dyspnea Has not passed out before   Past Medical History:  Diagnosis Date  . Abnormal weight loss 06/13/2011   Overview:  Last Assessment & Plan:  Likely related to postop decreased appetite, may be improving, check tsh,  to f/u any worsening symptoms or concerns  . Abnormality of gait and mobility 03/16/2015  . Alcohol abuse   . Anemia   . Anxiety   . Arthritis   . Arthritis of right hip 01/26/2014   Ultrasound guided injection of the right hip 01/26/2014   . ASCVD (arteriosclerotic cardiovascular disease) 03/16/2015  . Asthmatic bronchitis   . B12 deficiency 01/01/2012  . Bilateral hearing loss 06/07/2015  . Bilateral impacted cerumen 06/07/2015  . Blood transfusion   . BPH (benign prostatic hyperplasia) 10/02/2011  . Bronchiectasis   . Carotid artery occlusion   . Carotid disease, bilateral (HCC) 09/20/2011  . Carotid stenosis 12/13/2017  . Chest pain on exertion 01/25/2011   ? Etiology. Referral to Cardiology made 01/25/11   . Chronic bilateral low back pain with right-sided sciatica 03/16/2015  . Chronic constipation 07/09/2012   Overview:  Last Assessment & Plan:  Sitz marks has shown inertia-like findings Colonoscopy 2011 for same problem negative (polyps) Last colonoscopy  Will give MiraLax purge - sans bisacodyl Increase MiraLax to 2 doses daily Try daily  colchicine 0.6 mg qd F/U Prn  . Chronic low back pain 10/02/2011  . Chronic right hip pain 07/10/2013  . Chronic right shoulder pain 02/14/2012  . CONSTIPATION 02/21/2010   Qualifier: Diagnosis of  By: Myrtie Hawk, Amy S   . COPD (chronic obstructive pulmonary disease) (HCC)    sees Dr/ Delford Field, saw last Dec 2012  . Coronary artery disease    sees Dr. Eden Emms, saw 1 month ago  . Disorder of artery or arteriole (HCC) 09/20/2011   Overview:  Last Assessment & Plan:  Known moderate bilateral disease no TIA  F/u duplex in 6 months Reviewed [place] from today and no change   . Diverticulosis    Colonoscopy 2007/Dr. Leone Payor  . Diverticulosis of large intestine 03/16/2015  . DIVERTICULOSIS-COLON 02/21/2010   Qualifier: Diagnosis of  By: Monica Becton PA-c, Amy S   . Dizziness 01/10/2011  . Elevated PSA 03/16/2015  . Emotional lability 10/02/2011  . Gait disorder 02/14/2012  . Gastroesophageal reflux disease without esophagitis 03/16/2015  . Generalized anxiety disorder 03/16/2015  . GERD 12/04/2006   Qualifier: Diagnosis of  By: Delford Field MD, Charlcie Cradle   . GERD (gastroesophageal reflux disease)   . H/O hiatal hernia   . Hearing loss of right ear 03/30/2012   Overview:  Last Assessment & Plan:  Improved with irrigation,  to f/u any worsening symptoms or concerns  . Hx of CABG 05/23/2011  . Hx of colonic polyp    tubular adenoma (05/16/2009), hyperplastic polyp  .  HX OF GALLSTONE 05/21/2007   Qualifier: History of  By: Dorian PodPeterman NCMA, Pam    . Hyperlipidemia   . Hyperopia with astigmatism and presbyopia, bilateral 07/19/2017  . Hypersensitivity pneumonitis with associated bronchiectasis 10/11/2010   Hypersensitivity pneumonitis in setting of bronchiectasis.(09/26/10) Neg FOB for pathogens or CA 7/12 Mold found in home RAST assay pos,  Aspergillous, IgE 330 ONO RA 01/26/11: normal      . Imbalance 08/21/2017  . Impaired glucose tolerance 06/13/2011  . Insomnia   . Ischemic foot pain at rest [facility](HCC) 03/23/2017  . Left  carpal tunnel syndrome 07/10/2013  . Lightheaded 06/07/2015  . Lumbar disc disease 10/02/2011  . Nephrolithiasis   . NEPHROLITHIASIS, HX OF 05/21/2007   Qualifier: History of  By: Dorian PodPeterman NCMA, Pam    . 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 Nondependent alcohol abuse, in remission 05/21/2007   Qualifier: Diagnosis of  By: Dorian PodPeterman NCMA, Pam    . Olecranon bursitis of right elbow 07/10/2013  . Orthostatic hypotension 03/29/2012  . Osteopenia 01/01/2012  . Other chronic pain 05/21/2007   Qualifier: Diagnosis of  By: Dorian PodPeterman NCMA, Pam    . PAD (peripheral artery disease) (HCC) 03/23/2017  . Paroxysmal atrial fibrillation (HCC) 05/23/2011  . Peripheral edema 07/09/2012  . PERSONAL HX COLONIC POLYPS 02/21/2010   Qualifier: Diagnosis of  By: Monica BectonEsterwood PA-c, Amy S   . Pleurisy   . Pneumonia   . Preventative health care 12/11/2010  . Primary osteoarthritis of right hip 03/16/2015  . Pulmonary fibrosis (HCC) 07/10/2013  . Rash 10/25/2015  . Rash and nonspecific skin eruption 01/12/2013  . Referred otalgia of left ear 08/21/2017  . Right arm pain 04/24/2012  . Right lateral epicondylitis 02/14/2012  . Rotator cuff arthropathy 07/22/2013  . S/P CABG (coronary artery bypass graft)   . Shortness of breath   . 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 Thrombocytopenia (HCC) 02/09/2015  . Tremor 05/23/2011  . Unsteadiness 07/26/2015  . Urinary incontinence 03/16/2015  . Vitamin B12 deficiency 01/01/2012  . Weakness 09/15/2015  . Weight loss 06/13/2011    Patient Active Problem List   Diagnosis Date Noted  . Carotid stenosis 12/13/2017  . Referred otalgia of left ear 08/21/2017  . 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 Imbalance 08/21/2017  . Hyperopia with astigmatism and presbyopia, bilateral 07/19/2017  . Ischemic foot pain at rest [facility](HCC) 03/23/2017  . PAD (peripheral artery disease) (HCC) 03/23/2017  . Shortness of breath 06/05/2016  . Rash 10/25/2015  . Weakness  09/15/2015  . Unsteadiness 07/26/2015  . Bilateral hearing loss 06/07/2015  . Bilateral impacted cerumen 06/07/2015  . Lightheaded 06/07/2015  . Generalized anxiety disorder 03/16/2015  . Primary osteoarthritis of right hip 03/16/2015  . Diverticulosis of large intestine 03/16/2015  . Chronic bilateral low back pain with right-sided sciatica 03/16/2015  . Urinary incontinence 03/16/2015  . Elevated PSA 03/16/2015  . Gastroesophageal reflux disease without esophagitis 03/16/2015  . Abnormality of gait and mobility 03/16/2015  . ASCVD (arteriosclerotic cardiovascular disease) 03/16/2015  . Thrombocytopenia (HCC) 02/09/2015  . Arthritis of right hip 01/26/2014  . SI joint arthritis 10/22/2013  . Rotator cuff arthropathy 07/22/2013  . Pulmonary fibrosis (HCC) 07/10/2013  . Chronic right hip pain 07/10/2013  . Left carpal tunnel syndrome 07/10/2013  . Olecranon bursitis of right elbow 07/10/2013  . Rash and nonspecific skin eruption 01/12/2013  . Peripheral edema 07/09/2012  . Chronic constipation 07/09/2012  . Right arm pain 04/24/2012  .  Hearing loss of right ear 03/30/2012  . Orthostatic hypotension 03/29/2012  . Gait disorder 02/14/2012  . Right lateral epicondylitis 02/14/2012  . Chronic right shoulder pain 02/14/2012  . Vitamin B12 deficiency 01/01/2012  . Osteopenia 01/01/2012  . Lumbar disc disease 10/02/2011  . Emotional lability 10/02/2011  . Chronic low back pain 10/02/2011  . BPH (benign prostatic hyperplasia) 10/02/2011  . Carotid disease, bilateral (HCC) 09/20/2011  . Disorder of artery or arteriole (HCC) 09/20/2011  . Weight loss 06/13/2011  . Impaired glucose tolerance 06/13/2011  . Anemia 06/13/2011  . Abnormal weight loss 06/13/2011  . Hx of CABG 05/23/2011  . Paroxysmal atrial fibrillation (HCC) 05/23/2011  . Tremor 05/23/2011  . Chest pain on exertion 01/25/2011  . Dizziness 01/10/2011  . Anxiety 12/13/2010  . Preventative health care 12/11/2010  .  Hypersensitivity pneumonitis with associated bronchiectasis 10/11/2010  . DIVERTICULOSIS-COLON 02/21/2010  . CONSTIPATION 02/21/2010  . PERSONAL HX COLONIC POLYPS 02/21/2010  . Hyperlipidemia 05/21/2007  . Nondependent alcohol abuse, in remission 05/21/2007  . Other chronic pain 05/21/2007  . Insomnia 05/21/2007  . HX OF GALLSTONE 05/21/2007  . NEPHROLITHIASIS, HX OF 05/21/2007  . GERD 12/04/2006    Past Surgical History:  Procedure Laterality Date  . CARDIAC CATHETERIZATION     Friday 4, 2013  . CATARACT EXTRACTION     bilateral  . COLONOSCOPY     multiple  . CORONARY ARTERY BYPASS GRAFT  03/31/2011   Procedure: CORONARY ARTERY BYPASS GRAFTING (CABG);  Surgeon: [name], MD;  Location: [facility] OR;  Service: Open Heart Surgery;  Laterality: N/A;  Times three using the mammary and right leg greater saphenous vein.   Marland Kitchen KIDNEY STONE SURGERY     removal of kidney stone  . TONSILLECTOMY    . TRANSCAROTID ARTERY REVASCULARIZATION Right 12/13/2017   Procedure: RIGHT TRANSCAROTID ARTERY REVASCULARIZATION;  Surgeon: [name], MD;  Location: [facility] OR;  Service: Vascular;  Laterality: Right;     OB History   No obstetric history on file.      Home Medications    Prior to Admission medications   Medication Sig Start Date End Date Taking? Authorizing Provider  albuterol (PROVENTIL HFA;VENTOLIN HFA) 108 (90 Base) MCG/ACT inhaler Inhale 2 puffs into the lungs every 6 (six) hours as needed for wheezing or shortness of breath. 11/23/17  Yes Leone Brand, NP  aspirin 81 MG tablet Take 81 mg by mouth daily.   Yes [provider]  clopidogrel (PLAVIX) 75 MG tablet Take 1 tablet (75 mg total) by mouth daily. 12/05/17  Yes [name], MD  guaiFENesin (MUCINEX) 600 MG 12 hr tablet Take 2 tablets (1,200 mg total) by mouth 2 (two) times daily as needed for cough or to loosen phlegm. 12/08/14  Yes Coralyn Helling, MD  LORazepam (ATIVAN) 0.5 MG tablet TAKE ONE TABLET BY  MOUTH TWICE DAILY AS NEEDED FOR ANXIETY 08/28/13  Yes Corwin Levins, MD  metoprolol tartrate (LOPRESSOR) 25 MG tablet TAKE ONE TABLET BY MOUTH TWICE DAILY 03/17/15  Yes Wendall Stade, MD  omega-3 acid ethyl esters (LOVAZA) 1 g capsule Take 1 g by mouth daily.   Yes [provider]  omeprazole (PRILOSEC) 20 MG capsule TAKE ONE CAPSULE BY MOUTH ONCE DAILY 05/05/14  Yes Corwin Levins, MD  oxybutynin (DITROPAN) 5 MG tablet Take 5 mg by mouth 2 (two) times daily. 12/31/17  Yes [provider]  vitamin B-12 (CYANOCOBALAMIN) 1000 MCG tablet Take 1,000 mcg by mouth  daily.   Yes [provider]  atorvastatin (LIPITOR) 20 MG tablet Take 1 tablet (20 mg total) by mouth daily. 11/23/17 02/21/18  Leone Brand, NP  clotrimazole-betamethasone (LOTRISONE) cream Apply 1 application topically 2 (two) times daily as needed (rash).    [provider]  furosemide (LASIX) 20 MG tablet Take 20 mg by mouth daily as needed for fluid or edema.    [provider]    Family History Family History  Problem Relation Age of Onset  . Heart disease Mother   . Colon cancer Father   . Alcohol abuse Father   . Heart disease Other        maternal side   . Colon cancer Brother   . Lung cancer Brother   . Breast cancer Sister   . Heart disease Sister     Social History Social History   Tobacco Use  . Smoking status: Former Smoker    Packs/day: 1.00    Years: 10.00    Pack years: 10.00    Types: Cigarettes    Last attempt to quit: 03/20/1956    Years since quitting: 62.0  . Smokeless tobacco: Former User    Types: Snuff, Chew    Quit date: 03/20/1948  Substance Use Topics  . Alcohol use: No  . Drug use: No     Allergies   Penicillins; Alprazolam; Codeine; Tramadol; and Bisacodyl   Review of Systems Review of Systems  Constitutional: Negative for fever.  HENT: Negative for sore throat.   Eyes: Negative for visual disturbance.  Respiratory: Negative for cough and  shortness of breath.   Cardiovascular: Negative for chest pain.  Gastrointestinal: Negative for abdominal pain, nausea and vomiting.  Genitourinary: Negative for difficulty urinating and dysuria.  Musculoskeletal: Negative for back pain and neck stiffness.  Skin: Positive for wound. Negative for rash.  Neurological: Positive for headaches. Negative for dizziness, syncope, weakness and numbness.     Physical Exam Updated Vital Signs BP (!) 134/42   Pulse 63   Temp 97.9 F (36.6 C) (Oral)   Resp 14   Ht 5\' 11"  (1.803 m)   Wt 88 kg   SpO2 100%   BMI 27.06 kg/m   Physical Exam Vitals signs and nursing note reviewed.  Constitutional:      Appearance: She is well-developed.  HENT:     Head: Normocephalic.     Comments: Right frontal hematoma and abrasion Eyes:     Conjunctiva/sclera: Conjunctivae normal.  Neck:     Musculoskeletal: Neck supple.  Cardiovascular:     Rate and Rhythm: Normal rate and regular rhythm.     Heart sounds: No murmur.  Pulmonary:     Effort: Pulmonary effort is normal. No respiratory distress.     Breath sounds: Normal breath sounds.  Abdominal:     Palpations: Abdomen is soft.     Tenderness: There is no abdominal tenderness.  Skin:    General: Skin is warm and dry.  Neurological:     Mental Status: She is alert.      ED Treatments / Results  Labs (all labs ordered are listed, but only abnormal results are displayed) Labs Reviewed  CBC - Abnormal; Notable for the following components:      Result Value   RBC 2.84 (*)    Hemoglobin 9.8 (*)    HCT 31.1 (*)    MCV 109.5 (*)    MCH 34.5 (*)    Platelets 89 (*)  All other components within normal limits  COMPREHENSIVE METABOLIC PANEL - Abnormal; Notable for the following components:   Glucose, Bld 103 (*)    All other components within normal limits    EKG EKG Interpretation  Date/Time:  Wednesday March 20 2018 12:40:52 EST Ventricular Rate:  62 PR Interval:    QRS  Duration: 98 QT Interval:  439 QTC Calculation: 446 R Axis:   -7 Text Interpretation:  Sinus rhythm Atrial premature complex Borderline prolonged PR interval Low voltage, precordial leads RSR' in V1 or V2, right VCD or RVH Nonspecific T abnrm, anterolateral leads No significant change since last tracing Confirmed by Alvira MondaySchlossman, Harrell Niehoff ([id number]) on 03/20/2018 1:17:41 PM   Radiology Ct Head Wo Contrast  Result Date: 03/20/2018 CLINICAL DATA:  Patient with dizziness status post fall. EXAM: CT HEAD WITHOUT CONTRAST CT CERVICAL SPINE WITHOUT CONTRAST TECHNIQUE: Multidetector CT imaging of the head and cervical spine was performed following the standard protocol without intravenous contrast. Multiplanar CT image reconstructions of the cervical spine were also generated. COMPARISON:  Brain CT 04/02/2011 FINDINGS: CT HEAD FINDINGS Brain: Ventricles and sulci are prominent compatible with atrophy. Periventricular and subcortical white matter hypodensity compatible with chronic microvascular ischemic changes. Old right basal ganglia lacunar infarct. No evidence for acute cortically based infarct, intracranial hemorrhage, mass lesion or mass-effect. 7 Vascular: Unremarkable Skull: Intact. Sinuses/Orbits: Paranasal sinuses well aerated. Mastoid air cells unremarkable. Other: Soft tissue hematoma overlying the right forehead. CT CERVICAL SPINE FINDINGS Alignment: Normal anatomic alignment. Skull base and vertebrae: No acute fracture. No primary bone lesion or focal pathologic process. Soft tissues and spinal canal: No prevertebral fluid or swelling. No visible canal hematoma. Disc levels: Degenerative disc disease most pronounced C5-6. No acute fracture. Upper chest: Right greater than left apical scarring. Other: Right carotid stent. IMPRESSION: No acute intracranial process. No acute cervical spine fracture. Soft tissue hematoma overlying the right forehead. Electronically Signed   By: Annia Beltrew  Davis M.D.   On: 03/20/2018  13:29   Ct Cervical Spine Wo Contrast  Result Date: 03/20/2018 CLINICAL DATA:  Patient with dizziness status post fall. EXAM: CT HEAD WITHOUT CONTRAST CT CERVICAL SPINE WITHOUT CONTRAST TECHNIQUE: Multidetector CT imaging of the head and cervical spine was performed following the standard protocol without intravenous contrast. Multiplanar CT image reconstructions of the cervical spine were also generated. COMPARISON:  Brain CT 04/02/2011 FINDINGS: CT HEAD FINDINGS Brain: Ventricles and sulci are prominent compatible with atrophy. Periventricular and subcortical white matter hypodensity compatible with chronic microvascular ischemic changes. Old right basal ganglia lacunar infarct. No evidence for acute cortically based infarct, intracranial hemorrhage, mass lesion or mass-effect. 7 Vascular: Unremarkable Skull: Intact. Sinuses/Orbits: Paranasal sinuses well aerated. Mastoid air cells unremarkable. Other: Soft tissue hematoma overlying the right forehead. CT CERVICAL SPINE FINDINGS Alignment: Normal anatomic alignment. Skull base and vertebrae: No acute fracture. No primary bone lesion or focal pathologic process. Soft tissues and spinal canal: No prevertebral fluid or swelling. No visible canal hematoma. Disc levels: Degenerative disc disease most pronounced C5-6. No acute fracture. Upper chest: Right greater than left apical scarring. Other: Right carotid stent. IMPRESSION: No acute intracranial process. No acute cervical spine fracture. Soft tissue hematoma overlying the right forehead. Electronically Signed   By: Annia Beltrew  Davis M.D.   On: 03/20/2018 13:29    Procedures Procedures (including critical care time)  Medications Ordered in ED Medications  ketorolac (TORADOL) injection 60 mg (60 mg Intramuscular Given 03/20/18 1427)     Initial Impression / Assessment and Plan /  ED Course  I have reviewed the triage vital signs and the nursing notes.  Pertinent labs & imaging results that were available  during my care of the patient were reviewed by me and considered in my medical decision making (see chart for details).     85yo male with complicated history above presents with concern for fall with LOC.  Overall given patient leaning over, suspect loss of balance and fall with LOC and amnesia versus less likely syncopal episode leading to fall. Discussed given this do not feel obs admission is indicated for episode and family agrees. Labs without acute abnormalities. EKG without acute changes. CT head and cspine WNL.  Recommend supportive care. Patient discharged in stable condition with understanding of reasons to return.   Final Clinical Impressions(s) / ED Diagnoses   Final diagnoses:  Fall, initial encounter  Traumatic hematoma of forehead, initial encounter  Abrasion  Concussion with brief LOC    ED Discharge Orders    None       Alvira Monday, MD 03/20/18 2139

## 2018-03-20 NOTE — ED Triage Notes (Signed)
Pt reports feeling dizzy and then woke up on the floor. Large contusion and abrasion noted above R eye. Reports ongoing dizziness. Denies chest pain. Pt given tylenol PTA. Denies neck or back pain.

## 2018-03-20 NOTE — ED Notes (Signed)
Patient transported to CT 

## 2018-11-04 ENCOUNTER — Other Ambulatory Visit: Payer: Self-pay | Admitting: Vascular Surgery

## 2018-11-04 DIAGNOSIS — I6523 Occlusion and stenosis of bilateral carotid arteries: Secondary | ICD-10-CM

## 2018-11-04 MED ORDER — CLOPIDOGREL BISULFATE 75 MG PO TABS: 75.0000 mg | ORAL_TABLET | Freq: Every day | ORAL | 6 refills | Status: DC

## 2018-12-13 ENCOUNTER — Other Ambulatory Visit: Payer: Self-pay | Admitting: Cardiology

## 2018-12-23 ENCOUNTER — Other Ambulatory Visit: Payer: Self-pay | Admitting: Cardiovascular Disease

## 2018-12-23 ENCOUNTER — Telehealth: Payer: Self-pay | Admitting: Cardiovascular Disease

## 2018-12-23 MED ORDER — ATORVASTATIN CALCIUM 20 MG PO TABS: 20.0000 mg | ORAL_TABLET | Freq: Every day | ORAL | 0 refills | Status: DC

## 2018-12-23 NOTE — Telephone Encounter (Signed)
That's fine

## 2018-12-23 NOTE — Telephone Encounter (Signed)
° °  Provider change request Patient  (spouse)request to only be seen in [facility] location, closer to their home.

## 2018-12-23 NOTE — Telephone Encounter (Signed)
Pt's medication was sent to pt's pharmacy as requested. Confirmation received.  °

## 2018-12-25 NOTE — Telephone Encounter (Signed)
° ° °  Left message vcml to schedule appointment in [facility]

## 2019-01-10 ENCOUNTER — Other Ambulatory Visit: Payer: Self-pay

## 2019-01-10 DIAGNOSIS — I6523 Occlusion and stenosis of bilateral carotid arteries: Secondary | ICD-10-CM

## 2019-01-14 ENCOUNTER — Inpatient Hospital Stay (HOSPITAL_COMMUNITY): Admission: RE | Admit: 2019-01-14 | Payer: Medicare HMO | Source: Ambulatory Visit

## 2019-01-14 ENCOUNTER — Ambulatory Visit: Payer: Medicare HMO | Admitting: Vascular Surgery

## 2019-01-28 NOTE — Progress Notes (Signed)
Cardiology Office Note:    Date:  01/29/2019   ID:  [name], DOB Jul 29, 1930, MRN [id number]  PCP:  Henri Medal, MD  Cardiologist:  Norman Herrlich, MD    Referring MD: Jackalyn Lombard I, MD    ASSESSMENT:    1. CAD in native artery   2. Mixed hyperlipidemia   3. Bilateral carotid artery stenosis   4. Anemia, unspecified type   5. Claudication in peripheral vascular disease (HCC)    PLAN:    In order of problems listed above:  1. Stable CAD having no angina on current medical therapy continue treatment including aspirin beta-blocker and his statin. 2. Stable lipids at target continue statin 3. Daily follow-up with vascular surgery he has a left carotid bruit 4. Stable followed by his PCP macrocytic anemia 5. Worsened he will discuss this when he is been seen by vascular surgery in the next month.   Next appointment: 6 months   Medication Adjustments/Labs and Tests Ordered: Current medicines are reviewed at length with the patient today.  Concerns regarding medicines are outlined above.  No orders of the defined types were placed in this encounter.  No orders of the defined types were placed in this encounter.   Chief Complaint  Patient presents with  . Follow-up    Establish care with  . Coronary Artery Disease    History of Present Illness:    Matthew Bridges is a 85 y.o. adult with a hx of coronary artery disease and bypass surgery 2013 last seen by Nada Boozer, NP 01/07/2018 . Compliance with diet, lifestyle and medications: Yes  He has a history of CAD with elective CABG in 2013.  Other problems include postoperative atrial fibrillation and he is not anticoagulated after bypass surgery carotid disease with PCI and stent right carotid artery 2019, anemia hemoglobin 9.80101 2020 and hyperlipidemia on high intensity statin.Marland Kitchen  He had a myocardial perfusion study performed 11/26/2017 which showed soft tissue attenuation no scar or ischemia  left ventricular function is normal EF 56%.  Overall he has done well he has palpitation not severe or sustained or recent.  I told her that worsens to contact my office we can make arrangements to send him a ZIO monitor.  No angina shortness of breath orthopnea chest pain or syncope.  He takes a diuretic and has intermittent lower extremity edema.  His real complaint is soreness in his sternal scar he has a keloid and he can add vitamin E topically to it.  Also complains of what sounds like typical claudication left lower extremity nocturnally alleviated by placing the foot over the side of the bed.  He is due for vascular surgery follow-up with Dr. Chestine Spore and I asked him to discuss this and likely needs peripheral vascular studies done for limb ischemia.  At this time his heart disease is compensated I think he requires an ischemia evaluation Past Medical History:  Diagnosis Date  . Abnormal weight loss 06/13/2011   Overview:  Last Assessment & Plan:  Likely related to postop decreased appetite, may be improving, check tsh,  to f/u any worsening symptoms or concerns  . Abnormality of gait and mobility 03/16/2015  . Alcohol abuse   . Anemia   . Anxiety   . Arthritis   . Arthritis of right hip 01/26/2014   Ultrasound guided injection of the right hip 01/26/2014   . ASCVD (arteriosclerotic cardiovascular disease) 03/16/2015  . Asthmatic bronchitis   . B12 deficiency 01/01/2012  .  Bilateral hearing loss 06/07/2015  . Bilateral impacted cerumen 06/07/2015  . Blood transfusion   . BPH (benign prostatic hyperplasia) 10/02/2011  . Bronchiectasis   . Carotid artery occlusion   . Carotid disease, bilateral (HCC) 09/20/2011  . Carotid stenosis 12/13/2017  . Chest pain on exertion 01/25/2011   ? Etiology. Referral to Cardiology made 01/25/11   . Chronic bilateral low back pain with right-sided sciatica 03/16/2015  . Chronic constipation 07/09/2012   Overview:  Last Assessment & Plan:  Sitz marks has shown  inertia-like findings Colonoscopy 2011 for same problem negative (polyps) Last colonoscopy  Will give MiraLax purge - sans bisacodyl Increase MiraLax to 2 doses daily Try daily colchicine 0.6 mg qd F/U Prn  . Chronic low back pain 10/02/2011  . Chronic right hip pain 07/10/2013  . Chronic right shoulder pain 02/14/2012  . CONSTIPATION 02/21/2010   Qualifier: Diagnosis of  By: Myrtie Hawk, Amy S   . COPD (chronic obstructive pulmonary disease) (HCC)    sees Dr/ Delford Field, saw last Dec 2012  . Coronary artery disease    sees Dr. Eden Emms, saw 1 month ago  . Disorder of artery or arteriole (HCC) 09/20/2011   Overview:  Last Assessment & Plan:  Known moderate bilateral disease no TIA  F/u duplex in 6 months Reviewed [place] from today and no change   . Diverticulosis    Colonoscopy 2007/Dr. Leone Payor  . Diverticulosis of large intestine 03/16/2015  . DIVERTICULOSIS-COLON 02/21/2010   Qualifier: Diagnosis of  By: Monica Becton PA-c, Amy S   . Dizziness 01/10/2011  . Elevated PSA 03/16/2015  . Emotional lability 10/02/2011  . Gait disorder 02/14/2012  . Gastroesophageal reflux disease without esophagitis 03/16/2015  . Generalized anxiety disorder 03/16/2015  . GERD 12/04/2006   Qualifier: Diagnosis of  By: Delford Field MD, Charlcie Cradle   . GERD (gastroesophageal reflux disease)   . H/O hiatal hernia   . Hearing loss of right ear 03/30/2012   Overview:  Last Assessment & Plan:  Improved with irrigation,  to f/u any worsening symptoms or concerns  . Hx of CABG 05/23/2011  . Hx of colonic polyp    tubular adenoma (05/16/2009), hyperplastic polyp  . HX OF GALLSTONE 05/21/2007   Qualifier: History of  By: Dorian Pod, Pam    . Hyperlipidemia   . Hyperopia with astigmatism and presbyopia, bilateral 07/19/2017  . Hypersensitivity pneumonitis with associated bronchiectasis 10/11/2010   Hypersensitivity pneumonitis in setting of bronchiectasis.(09/26/10) Neg FOB for pathogens or CA 7/12 Mold found in home RAST assay pos,   Aspergillous, IgE 330 ONO RA 01/26/11: normal      . Imbalance 08/21/2017  . Impaired glucose tolerance 06/13/2011  . Insomnia   . Ischemic foot pain at rest 03/23/2017  . Left carpal tunnel syndrome 07/10/2013  . Lightheaded 06/07/2015  . Lumbar disc disease 10/02/2011  . Nephrolithiasis   . NEPHROLITHIASIS, HX OF 05/21/2007   Qualifier: History of  By: Dorian Pod, Pam    . 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 Nondependent alcohol abuse, in remission 05/21/2007   Qualifier: Diagnosis of  By: Dorian Pod, Pam    . Olecranon bursitis of right elbow 07/10/2013  . Orthostatic hypotension 03/29/2012  . Osteopenia 01/01/2012  . Other chronic pain 05/21/2007   Qualifier: Diagnosis of  By: Dorian Pod, Pam    . PAD (peripheral artery disease) (HCC) 03/23/2017  . Paroxysmal atrial fibrillation (HCC) 05/23/2011  . Peripheral edema 07/09/2012  . PERSONAL  HX COLONIC POLYPS 02/21/2010   Qualifier: Diagnosis of  By: Trellis Paganini PA-c, Amy S   . Pleurisy   . Pneumonia   . Preventative health care 12/11/2010  . Primary osteoarthritis of right hip 03/16/2015  . Pulmonary fibrosis ([place]) 07/10/2013  . Rash 10/25/2015  . Rash and nonspecific skin eruption 01/12/2013  . Referred otalgia of left ear 08/21/2017  . Right arm pain 04/24/2012  . Right lateral epicondylitis 02/14/2012  . Rotator cuff arthropathy 07/22/2013  . S/P CABG (coronary artery bypass graft)   . Shortness of breath   . 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 Thrombocytopenia ([place]) 02/09/2015  . Tremor 05/23/2011  . Unsteadiness 07/26/2015  . Urinary incontinence 03/16/2015  . Vitamin B12 deficiency 01/01/2012  . Weakness 09/15/2015  . Weight loss 06/13/2011    Past Surgical History:  Procedure Laterality Date  . CARDIAC CATHETERIZATION     Friday 4, 2013  . CATARACT EXTRACTION     bilateral  . COLONOSCOPY     multiple  . CORONARY ARTERY BYPASS GRAFT  03/31/2011   Procedure: CORONARY ARTERY BYPASS GRAFTING (CABG);   Surgeon: Gaye Pollack, MD;  Location: [place] [facility];  Service: Open Heart Surgery;  Laterality: N/A;  Times three using the mammary and right leg greater saphenous vein.   Marland Kitchen KIDNEY STONE SURGERY     removal of kidney stone  . TONSILLECTOMY    . TRANSCAROTID ARTERY REVASCULARIZATION Right 12/13/2017   Procedure: RIGHT TRANSCAROTID ARTERY REVASCULARIZATION;  Surgeon: Marty Heck, MD;  Location: [facility] OR;  Service: Vascular;  Laterality: Right;    Current Medications: Current Meds  Medication Sig  . albuterol (PROVENTIL HFA;VENTOLIN HFA) 108 (90 Base) MCG/ACT inhaler Inhale 2 puffs into the lungs every 6 (six) hours as needed for wheezing or shortness of breath.  Marland Kitchen aspirin 81 MG tablet Take 81 mg by mouth daily.  . clotrimazole-betamethasone (LOTRISONE) cream Apply 1 application topically 2 (two) times daily as needed (rash).  . furosemide (LASIX) 20 MG tablet Take 20 mg by mouth daily as needed for fluid or edema.  Marland Kitchen guaiFENesin (MUCINEX) 600 MG 12 hr tablet Take 2 tablets (1,200 mg total) by mouth 2 (two) times daily as needed for cough or to loosen phlegm.  . metoprolol tartrate (LOPRESSOR) 25 MG tablet TAKE 1 TABLET EVERY DAY  . Omega-3 Fatty Acids (OMEGA-3 FISH OIL PO) Take 1 tablet by mouth daily.  Marland Kitchen oxybutynin (DITROPAN) 5 MG tablet Take 5 mg by mouth 2 (two) times daily.  . vitamin B-12 (CYANOCOBALAMIN) 1000 MCG tablet Take 1,000 mcg by mouth daily.     Allergies:   Penicillins, Alprazolam, Codeine, Sertraline, Tramadol, and Bisacodyl   Social History   Socioeconomic History  . Marital status: Married    Spouse name: Not on file  . Number of children: 3  . Years of education: Not on file  . Highest education level: Not on file  Occupational History  . Occupation: Retired  Scientific laboratory technician  . Financial resource strain: Not on file  . Food insecurity    Worry: Not on file    Inability: Not on file  . Transportation needs    Medical: Not on file    Non-medical: Not on file   Tobacco Use  . Smoking status: Former Smoker    Packs/day: 1.00    Years: 10.00    Pack years: 10.00    Types: Cigarettes    Quit date: 03/20/1956    Years since quitting: 62.9  .  Smokeless tobacco: Former NeurosurgeonUser    Types: Snuff, Dorna BloomChew    Quit date: 03/20/1948  Substance and Sexual Activity  . Alcohol use: No  . Drug use: No  . Sexual activity: Not on file  Lifestyle  . Physical activity    Days per week: Not on file    Minutes per session: Not on file  . Stress: Not on file  Relationships  . Social Musicianconnections    Talks on phone: Not on file    Gets together: Not on file    Attends religious service: Not on file    Active member of club or organization: Not on file    Attends meetings of clubs or organizations: Not on file    Relationship status: Not on file  Other Topics Concern  . Not on file  Social History Narrative  . Not on file     Family History: The patient's family history includes Alcohol abuse in her father; Breast cancer in her sister; Colon cancer in her brother and father; Heart disease in her mother, sister, and another family member; Lung cancer in her brother. ROS:   Please see the history of present illness.    All other systems reviewed and are negative.  EKGs/Labs/Other Studies Reviewed:    The following studies were reviewed today:  EKG:  EKG ordered today and personally reviewed.  The ekg ordered today demonstrates sinus rhythm old inferior MI first-degree AV block  Recent Labs:  12/17/2018: CMP normal except for glucose 120 potassium was 4.0 GFR 82 cc hemoglobin 10.3 macrocytic 08/20/2018 TSH normal 1.98 cholesterol 120 LDL 63 triglyceride 60 HDL 47 03/20/2018: ALT 16; BUN 20; Creatinine, Ser 0.74; Hemoglobin 9.8; Platelets 89; Potassium 4.3; Sodium 138  Recent Lipid Panel    Component Value Date/Time   CHOL 204 (H) 01/09/2013 1437   TRIG 116.0 01/09/2013 1437   HDL 53.20 01/09/2013 1437   CHOLHDL 4 01/09/2013 1437   VLDL 23.2 01/09/2013 1437    LDLDIRECT 140.9 01/09/2013 1437    Physical Exam:    VS:  BP (!) 124/58 (BP Location: Left Arm, Patient Position: Sitting, Cuff Size: Normal)   Pulse 67   Ht 5\' 11"  (1.803 m)   Wt 185 lb (83.9 kg)   SpO2 98%   BMI 25.80 kg/m     Wt Readings from Last 3 Encounters:  01/29/19 185 lb (83.9 kg)  03/20/18 194 lb (88 kg)  01/15/18 195 lb (88.5 kg)     GEN: He appears frail in no acute distress HEENT: Normal NECK: No JVD; left carotid bruit LYMPHATICS: No lymphadenopathy CARDIAC: RRR, no murmurs, rubs, gallops, neck keloid sternal scar RESPIRATORY:  Clear to auscultation without rales, wheezing or rhonchi  ABDOMEN: Soft, non-tender, non-distended MUSCULOSKELETAL:  No edema; No deformity  SKIN: Warm and dry NEUROLOGIC:  Alert and oriented x 3 PSYCHIATRIC:  Normal affect    Signed, Norman HerrlichBrian Lannah Koike, MD  01/29/2019 3:56 PM    [facility]

## 2019-01-29 ENCOUNTER — Other Ambulatory Visit: Payer: Self-pay

## 2019-01-29 ENCOUNTER — Ambulatory Visit (INDEPENDENT_AMBULATORY_CARE_PROVIDER_SITE_OTHER): Payer: Medicare HMO | Admitting: Cardiology

## 2019-01-29 ENCOUNTER — Encounter: Payer: Self-pay | Admitting: Cardiology

## 2019-01-29 ENCOUNTER — Ambulatory Visit: Payer: Medicare HMO | Admitting: Cardiovascular Disease

## 2019-01-29 VITALS — BP 124/58 | HR 67 | Ht 71.0 in | Wt 185.0 lb

## 2019-01-29 DIAGNOSIS — E782 Mixed hyperlipidemia: Secondary | ICD-10-CM

## 2019-01-29 DIAGNOSIS — I6523 Occlusion and stenosis of bilateral carotid arteries: Secondary | ICD-10-CM

## 2019-01-29 DIAGNOSIS — D649 Anemia, unspecified: Secondary | ICD-10-CM

## 2019-01-29 DIAGNOSIS — I739 Peripheral vascular disease, unspecified: Secondary | ICD-10-CM

## 2019-01-29 DIAGNOSIS — I2510 Atherosclerotic heart disease of native coronary artery without angina pectoris: Secondary | ICD-10-CM | POA: Diagnosis not present

## 2019-01-29 NOTE — Patient Instructions (Signed)
Medication Instructions:  Your physician recommends that you continue on your current medications as directed. Please refer to the Current Medication list given to you today.  *If you need a refill on your cardiac medications before your next appointment, please call your pharmacy*  Lab Work: None  If you have labs (blood work) drawn today and your tests are completely normal, you will receive your results only by: . MyChart Message (if you have MyChart) OR . A paper copy in the mail If you have any lab test that is abnormal or we need to change your treatment, we will call you to review the results.  Testing/Procedures: You had an EKG today.   Follow-Up: At [facility], you and your health needs are our priority.  As part of our continuing mission to provide you with exceptional heart care, we have created designated Provider Care Teams.  These Care Teams include your primary Cardiologist (physician) and Advanced Practice Providers (APPs -  Physician Assistants and Nurse Practitioners) who all work together to provide you with the care you need, when you need it.  Your next appointment:   6 month(s)  The format for your next appointment:   In Person  Provider:   Brian Munley, MD   

## 2019-03-04 ENCOUNTER — Ambulatory Visit: Payer: Medicare HMO | Admitting: Vascular Surgery

## 2019-03-04 ENCOUNTER — Encounter (HOSPITAL_COMMUNITY): Payer: Medicare HMO

## 2019-04-08 ENCOUNTER — Ambulatory Visit: Payer: Medicare HMO | Admitting: Vascular Surgery

## 2019-04-08 ENCOUNTER — Encounter (HOSPITAL_COMMUNITY): Payer: Medicare HMO

## 2019-04-21 ENCOUNTER — Other Ambulatory Visit: Payer: Self-pay | Admitting: Cardiovascular Disease

## 2019-04-23 ENCOUNTER — Other Ambulatory Visit: Payer: Self-pay | Admitting: *Deleted

## 2019-04-23 MED ORDER — ATORVASTATIN CALCIUM 20 MG PO TABS: 20.0000 mg | ORAL_TABLET | Freq: Every day | ORAL | 3 refills | Status: DC

## 2019-04-28 ENCOUNTER — Telehealth (HOSPITAL_COMMUNITY): Payer: Self-pay

## 2019-04-28 NOTE — Telephone Encounter (Signed)

## 2019-04-29 ENCOUNTER — Ambulatory Visit (INDEPENDENT_AMBULATORY_CARE_PROVIDER_SITE_OTHER): Payer: Medicare HMO | Admitting: Vascular Surgery

## 2019-04-29 ENCOUNTER — Other Ambulatory Visit: Payer: Self-pay

## 2019-04-29 ENCOUNTER — Ambulatory Visit (HOSPITAL_COMMUNITY)
Admission: RE | Admit: 2019-04-29 | Discharge: 2019-04-29 | Disposition: A | Discharge status: Home / Self Care | Payer: Medicare HMO | Source: Ambulatory Visit | Attending: Vascular Surgery | Admitting: Vascular Surgery

## 2019-04-29 ENCOUNTER — Encounter: Payer: Self-pay | Admitting: Vascular Surgery

## 2019-04-29 VITALS — BP 141/65 | HR 64 | Temp 98.1°F | Resp 18 | Ht 71.0 in | Wt 185.0 lb

## 2019-04-29 DIAGNOSIS — I6523 Occlusion and stenosis of bilateral carotid arteries: Secondary | ICD-10-CM

## 2019-04-29 NOTE — Progress Notes (Signed)
Patient name: Matthew Bridges MRN: [id number] DOB: 21-Jun-1930 Sex: adult  REASON FOR VISIT: planned 6 month follow-up for TCAR surveillance  HPI: Matthew Bridges is a 86 y.o. adult who presents for ongoing follow-up status post right trans-carotid artery revascularization (TCAR procedure) on 12/13/17 for high-grade asymptomatic stenosis near string sign.  We are also monitoring a moderate stenosis in the left ICA.  Have not seen him over the last year due to Covid.  Ultimately he states that he feels his memory is going and is a bit more frail.  Denies any new neurologic events like vision loss or weakness or numbness in arm or leg.  No history of stroke or TIA.  He remains on aspirin statin and Plavix.  Past Medical History:  Diagnosis Date  . Abnormal weight loss 06/13/2011   Overview:  Last Assessment & Plan:  Likely related to postop decreased appetite, may be improving, check tsh,  to f/u any worsening symptoms or concerns  . Abnormality of gait and mobility 03/16/2015  . Alcohol abuse   . Anemia   . Anxiety   . Arthritis   . Arthritis of right hip 01/26/2014   Ultrasound guided injection of the right hip 01/26/2014   . ASCVD (arteriosclerotic cardiovascular disease) 03/16/2015  . Asthmatic bronchitis   . B12 deficiency 01/01/2012  . Bilateral hearing loss 06/07/2015  . Bilateral impacted cerumen 06/07/2015  . Blood transfusion   . BPH (benign prostatic hyperplasia) 10/02/2011  . Bronchiectasis   . Carotid artery occlusion   . Carotid disease, bilateral ([place]) 09/20/2011  . Carotid stenosis 12/13/2017  . Chest pain on exertion 01/25/2011   ? Etiology. Referral to Cardiology made 01/25/11   . Chronic bilateral low back pain with right-sided sciatica 03/16/2015  . Chronic constipation 07/09/2012   Overview:  Last Assessment & Plan:  Sitz marks has shown inertia-like findings Colonoscopy 2011 for same problem negative (polyps) Last colonoscopy  Will give MiraLax purge - sans bisacodyl  Increase MiraLax to 2 doses daily Try daily colchicine 0.6 mg qd F/U Prn  . Chronic low back pain 10/02/2011  . Chronic right hip pain 07/10/2013  . Chronic right shoulder pain 02/14/2012  . CONSTIPATION 02/21/2010   Qualifier: Diagnosis of  By: Shane Crutch, Amy S   . COPD (chronic obstructive pulmonary disease) ([place])    sees Dr/ Joya Gaskins, saw last Dec 2012  . Coronary artery disease    sees Dr. Johnsie Cancel, saw 1 month ago  . Disorder of artery or arteriole ([place]) 09/20/2011   Overview:  Last Assessment & Plan:  Known moderate bilateral disease no TIA  F/u duplex in 6 months Reviewed [place] from today and no change   . Diverticulosis    Colonoscopy 2007/Dr. Carlean Purl  . Diverticulosis of large intestine 03/16/2015  . DIVERTICULOSIS-COLON 02/21/2010   Qualifier: Diagnosis of  By: Trellis Paganini PA-c, Amy S   . Dizziness 01/10/2011  . Elevated PSA 03/16/2015  . Emotional lability 10/02/2011  . Gait disorder 02/14/2012  . Gastroesophageal reflux disease without esophagitis 03/16/2015  . Generalized anxiety disorder 03/16/2015  . GERD 12/04/2006   Qualifier: Diagnosis of  By: Joya Gaskins MD, Burnett Harry   . GERD (gastroesophageal reflux disease)   . H/O hiatal hernia   . Hearing loss of right ear 03/30/2012   Overview:  Last Assessment & Plan:  Improved with irrigation,  to f/u any worsening symptoms or concerns  . Hx of CABG 05/23/2011  . Hx of colonic polyp  tubular adenoma (05/16/2009), hyperplastic polyp  . HX OF GALLSTONE 05/21/2007   Qualifier: History of  By: Dorian Pod, Pam    . Hyperlipidemia   . Hyperopia with astigmatism and presbyopia, bilateral 07/19/2017  . Hypersensitivity pneumonitis with associated bronchiectasis 10/11/2010   Hypersensitivity pneumonitis in setting of bronchiectasis.(09/26/10) Neg FOB for pathogens or CA 7/12 Mold found in home RAST assay pos,  Aspergillous, IgE 330 ONO RA 01/26/11: normal      . Imbalance 08/21/2017  . Impaired glucose tolerance 06/13/2011  . Insomnia   . Ischemic  foot pain at rest 03/23/2017  . Left carpal tunnel syndrome 07/10/2013  . Lightheaded 06/07/2015  . Lumbar disc disease 10/02/2011  . Nephrolithiasis   . NEPHROLITHIASIS, HX OF 05/21/2007   Qualifier: History of  By: Dorian Pod, Pam    . 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 Nondependent alcohol abuse, in remission 05/21/2007   Qualifier: Diagnosis of  By: Dorian Pod, Pam    . Olecranon bursitis of right elbow 07/10/2013  . Orthostatic hypotension 03/29/2012  . Osteopenia 01/01/2012  . Other chronic pain 05/21/2007   Qualifier: Diagnosis of  By: Dorian Pod, Pam    . PAD (peripheral artery disease) (HCC) 03/23/2017  . Paroxysmal atrial fibrillation (HCC) 05/23/2011  . Peripheral edema 07/09/2012  . PERSONAL HX COLONIC POLYPS 02/21/2010   Qualifier: Diagnosis of  By: Monica Becton PA-c, Amy S   . Pleurisy   . Pneumonia   . Preventative health care 12/11/2010  . Primary osteoarthritis of right hip 03/16/2015  . Pulmonary fibrosis (HCC) 07/10/2013  . Rash 10/25/2015  . Rash and nonspecific skin eruption 01/12/2013  . Referred otalgia of left ear 08/21/2017  . Right arm pain 04/24/2012  . Right lateral epicondylitis 02/14/2012  . Rotator cuff arthropathy 07/22/2013  . S/P CABG (coronary artery bypass graft)   . Shortness of breath   . 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 Thrombocytopenia (HCC) 02/09/2015  . Tremor 05/23/2011  . Unsteadiness 07/26/2015  . Urinary incontinence 03/16/2015  . Vitamin B12 deficiency 01/01/2012  . Weakness 09/15/2015  . Weight loss 06/13/2011    Past Surgical History:  Procedure Laterality Date  . CARDIAC CATHETERIZATION     Friday 4, 2013  . CATARACT EXTRACTION     bilateral  . COLONOSCOPY     multiple  . CORONARY ARTERY BYPASS GRAFT  03/31/2011   Procedure: CORONARY ARTERY BYPASS GRAFTING (CABG);  Surgeon: [name], MD;  Location: [facility] OR;  Service: Open Heart Surgery;  Laterality: N/A;  Times three using the mammary and right leg  greater saphenous vein.   Marland Kitchen KIDNEY STONE SURGERY     removal of kidney stone  . TONSILLECTOMY    . TRANSCAROTID ARTERY REVASCULARIZATION Right 12/13/2017   Procedure: RIGHT TRANSCAROTID ARTERY REVASCULARIZATION;  Surgeon: [name], MD;  Location: [facility] OR;  Service: Vascular;  Laterality: Right;    Family History  Problem Relation Age of Onset  . Heart disease Mother   . Colon cancer Father   . Alcohol abuse Father   . Heart disease Other        maternal side   . Colon cancer Brother   . Lung cancer Brother   . Breast cancer Sister   . Heart disease Sister     SOCIAL HISTORY: Social History   Tobacco Use  . Smoking status: Former Smoker    Packs/day: 1.00    Years: 10.00    Pack  years: 10.00    Types: Cigarettes    Quit date: 03/20/1956    Years since quitting: 63.1  . Smokeless tobacco: Former Neurosurgeon    Types: Snuff, Dorna Bloom    Quit date: 03/20/1948  Substance Use Topics  . Alcohol use: No    Allergies  Allergen Reactions  . Penicillins Hives and Rash    Has patient had a PCN reaction causing immediate rash, facial/tongue/throat swelling, SOB or lightheadedness with hypotension: Unknown Has patient had a PCN reaction causing severe rash involving mucus membranes or skin necrosis: Unknown Has patient had a PCN reaction that required hospitalization: Unknown Has patient had a PCN reaction occurring within the last 10 years: Unknown If all of the above answers are "NO", then may proceed with Cephalosporin use.   . Alprazolam Other (See Comments)    Confusion, weakness Confusion, weakness Confusion, weakness Confusion, weakness  . Codeine Other (See Comments)    Makes him feel dizzy, out of it Makes him feel dizzy, out of it  . Sertraline   . Tramadol Other (See Comments)    Zombie state Zombie state Zombie state  . Bisacodyl Nausea And Vomiting    Makes stomach hurt  Makes stomach hurt Makes stomach hurt    Current Outpatient Medications  Medication  Sig Dispense Refill  . albuterol (PROVENTIL HFA;VENTOLIN HFA) 108 (90 Base) MCG/ACT inhaler Inhale 2 puffs into the lungs every 6 (six) hours as needed for wheezing or shortness of breath. 1 Inhaler 0  . aspirin 81 MG tablet Take 81 mg by mouth daily.    Marland Kitchen atorvastatin (LIPITOR) 20 MG tablet Take 1 tablet (20 mg total) by mouth daily. 90 tablet 3  . clotrimazole-betamethasone (LOTRISONE) cream Apply 1 application topically 2 (two) times daily as needed (rash).    . furosemide (LASIX) 20 MG tablet Take 20 mg by mouth daily as needed for fluid or edema.    Marland Kitchen guaiFENesin (MUCINEX) 600 MG 12 hr tablet Take 2 tablets (1,200 mg total) by mouth 2 (two) times daily as needed for cough or to loosen phlegm.    . metoprolol tartrate (LOPRESSOR) 25 MG tablet TAKE 1 TABLET EVERY DAY    . Omega-3 Fatty Acids (OMEGA-3 FISH OIL PO) Take 1 tablet by mouth daily.    Marland Kitchen oxybutynin (DITROPAN) 5 MG tablet Take 5 mg by mouth 2 (two) times daily.    . vitamin B-12 (CYANOCOBALAMIN) 1000 MCG tablet Take 1,000 mcg by mouth daily.     No current facility-administered medications for this visit.    PHYSICAL EXAM: Vitals:   04/29/19 1428 04/29/19 1430  BP: (!) 152/65 (!) 141/65  Pulse: 64 64  Resp: 18   Temp: 98.1 F (36.7 C)   TempSrc: Temporal   SpO2: 100%   Weight: 185 lb (83.9 kg)   Height: 5\' 11"  (1.803 m)     GENERAL: The patient is a well-nourished adult, in no acute distress. The vital signs are documented above. VASCULAR:  Neck incision right neck healing without issue PULMONARY: There is good air exchange bilaterally without wheezing or rales. NEUROLOGIC: No focal weakness or paresthesias are detected.  CN II-XII grossly intact. SKIN: There are no ulcers or rashes noted. PSYCHIATRIC: The patient has a normal affect.  DATA:   I independently reviewed his noninvasive imaging and his right carotid stent remains patent with no flow-limiting stenosis.  I think the velocities are relatively unchanged  over the last year even.  Moderate stenosis in the left ICA is  also stable in the 60 to 79% range.  Assessment/Plan:  86 year old male status post right trans-carotid artery revascularization with carotid stent (TCAR) on 12/13/17.  Presents for continued surveillance.  Reports no new neurologic events over the last year.  I reviewed his duplex today and I think his velocities in the right carotid stent are relatively unchanged and there is no significant stenosis.  Overall looks good.  The left moderate stenosis in the 60 to 79% range is stable over the past year and I discussed with him and his son would not offer any surgery on the left unless more than 80% stenosis in setting of asymptomatic disease.  Given his frailty would have to reevaluate if he is even a candidate for surgery but we will plan to see him back in 6 months for ongoing surveillance.   [name], MD [facility] Office: [phone]   [name]

## 2019-04-30 ENCOUNTER — Other Ambulatory Visit: Payer: Self-pay | Admitting: *Deleted

## 2019-04-30 DIAGNOSIS — I6523 Occlusion and stenosis of bilateral carotid arteries: Secondary | ICD-10-CM

## 2019-08-01 ENCOUNTER — Other Ambulatory Visit: Payer: Self-pay | Admitting: Vascular Surgery

## 2019-08-04 ENCOUNTER — Other Ambulatory Visit: Payer: Self-pay

## 2019-08-04 MED ORDER — CLOPIDOGREL BISULFATE 75 MG PO TABS: 75.0000 mg | ORAL_TABLET | Freq: Every day | ORAL | 0 refills | Status: DC

## 2019-08-19 ENCOUNTER — Other Ambulatory Visit: Payer: Self-pay | Admitting: Vascular Surgery

## 2019-08-19 ENCOUNTER — Other Ambulatory Visit: Payer: Self-pay | Admitting: Cardiology

## 2019-08-19 MED ORDER — CLOPIDOGREL BISULFATE 75 MG PO TABS: 75.0000 mg | ORAL_TABLET | Freq: Every day | ORAL | 0 refills | Status: DC

## 2019-08-19 NOTE — Telephone Encounter (Signed)
*  STAT* If patient is at the pharmacy, call can be transferred to refill team.   1. Which medications need to be refilled? (please list name of each medication and dose if known) clopidogrel (PLAVIX) 75 MG tablet  2. Which pharmacy/location (including street and city if local pharmacy) is medication to be sent to? [address], [place], [place] [address]   3. Do they need a 30 day or 90 day supply? 10 day supply.  Patient's son states that his dad had to go live with his brother for a little bit and wants to know if a 10 day supply can be sent to the [facility] in [place], [place].

## 2019-08-19 NOTE — Telephone Encounter (Signed)
Refill sent in per request.  

## 2019-09-03 ENCOUNTER — Ambulatory Visit: Payer: Medicare HMO | Admitting: Family Medicine

## 2019-09-03 DIAGNOSIS — Z0289 Encounter for other administrative examinations: Secondary | ICD-10-CM

## 2020-05-26 ENCOUNTER — Telehealth: Payer: Self-pay | Admitting: Cardiology

## 2020-05-26 MED ORDER — ATORVASTATIN CALCIUM 20 MG PO TABS: 20.0000 mg | ORAL_TABLET | Freq: Every day | ORAL | 3 refills | Status: AC

## 2020-05-26 NOTE — Telephone Encounter (Signed)
*  STAT* If patient is at the pharmacy, call can be transferred to refill team.   1. Which medications need to be refilled? (please list name of each medication and dose if known) atorvastatin  2. Which pharmacy/location (including street and city if local pharmacy) is medication to be sent to? [facility]  3. Do they need a 30 day or 90 day supply? 90

## 2020-05-26 NOTE — Telephone Encounter (Signed)
Refill sent in per request.  

## 2020-06-09 DIAGNOSIS — J45909 Unspecified asthma, uncomplicated: Secondary | ICD-10-CM | POA: Insufficient documentation

## 2020-06-09 DIAGNOSIS — I2510 Atherosclerotic heart disease of native coronary artery without angina pectoris: Secondary | ICD-10-CM | POA: Insufficient documentation

## 2020-06-09 DIAGNOSIS — I6529 Occlusion and stenosis of unspecified carotid artery: Secondary | ICD-10-CM | POA: Insufficient documentation

## 2020-06-09 DIAGNOSIS — J189 Pneumonia, unspecified organism: Secondary | ICD-10-CM | POA: Insufficient documentation

## 2020-06-09 DIAGNOSIS — K5790 Diverticulosis of intestine, part unspecified, without perforation or abscess without bleeding: Secondary | ICD-10-CM | POA: Insufficient documentation

## 2020-06-09 DIAGNOSIS — Z860100 Personal history of colon polyps, unspecified: Secondary | ICD-10-CM | POA: Insufficient documentation

## 2020-06-09 DIAGNOSIS — Z951 Presence of aortocoronary bypass graft: Secondary | ICD-10-CM | POA: Insufficient documentation

## 2020-06-09 DIAGNOSIS — Z8719 Personal history of other diseases of the digestive system: Secondary | ICD-10-CM | POA: Insufficient documentation

## 2020-06-09 DIAGNOSIS — K219 Gastro-esophageal reflux disease without esophagitis: Secondary | ICD-10-CM | POA: Insufficient documentation

## 2020-06-09 DIAGNOSIS — M1990 Unspecified osteoarthritis, unspecified site: Secondary | ICD-10-CM | POA: Insufficient documentation

## 2020-06-09 DIAGNOSIS — J449 Chronic obstructive pulmonary disease, unspecified: Secondary | ICD-10-CM | POA: Insufficient documentation

## 2020-06-09 DIAGNOSIS — F1010 Alcohol abuse, uncomplicated: Secondary | ICD-10-CM | POA: Insufficient documentation

## 2020-06-28 NOTE — Progress Notes (Deleted)
Cardiology Office Note:    Date:  06/28/2020   ID:  Matthew Bridges, DOB 1930/10/02, MRN [id number]  PCP:  Henri Medal, MD  Cardiologist:  Norman Herrlich, MD    Referring MD: Jackalyn Lombard I, MD    ASSESSMENT:    No diagnosis found. PLAN:    In order of problems listed above:  1. ***   Next appointment: ***   Medication Adjustments/Labs and Tests Ordered: Current medicines are reviewed at length with the patient today.  Concerns regarding medicines are outlined above.  No orders of the defined types were placed in this encounter.  No orders of the defined types were placed in this encounter.   No chief complaint on file.   History of Present Illness:    Matthew Bridges is a 87 y.o. adult with a hx of CAD with CABG 2013 postoperative atrial fibrillation carotid disease with PCI and stent right carotid artery 2019 chronic anemia and hyperlipidemia.  He was last seen 01/29/2019.  He had a myocardial perfusion study 11/26/2017 with no scar or ischemia and EF 56%. Compliance with diet, lifestyle and medications: ***  Carotid duplex reviewed from 04/29/2019 the systolic velocity post stent does not indicate restenosis after stent and left internal carotid artery has  moderate stenosis. Carotid duplex 04/29/2019: Summary:  Right Carotid: Velocities in the right ICA stent are consistent with a  40-59%  stenosis.  Left Carotid: Velocities in the left ICA are consistent with a 60-79%  stenosis.   Vertebrals: Bilateral vertebral arteries demonstrate antegrade flow.  Subclavians: Normal flow hemodynamics were seen in bilateral subclavian        arteries.  Past Medical History:  Diagnosis Date  . Abnormal weight loss 06/13/2011   Overview:  Last Assessment & Plan:  Likely related to postop decreased appetite, may be improving, check tsh,  to f/u any worsening symptoms or concerns  . Abnormality of gait and mobility 03/16/2015  . Alcohol abuse   .  Anemia   . Anxiety   . Arthritis   . Arthritis of right hip 01/26/2014   Ultrasound guided injection of the right hip 01/26/2014   . ASCVD (arteriosclerotic cardiovascular disease) 03/16/2015  . Asthmatic bronchitis   . B12 deficiency 01/01/2012  . Bilateral hearing loss 06/07/2015  . Bilateral impacted cerumen 06/07/2015  . Blood transfusion   . BPH (benign prostatic hyperplasia) 10/02/2011  . Bronchiectasis   . Carotid artery occlusion   . Carotid disease, bilateral (HCC) 09/20/2011  . Carotid stenosis 12/13/2017  . Chest pain on exertion 01/25/2011   ? Etiology. Referral to Cardiology made 01/25/11   . Chronic bilateral low back pain with right-sided sciatica 03/16/2015  . Chronic constipation 07/09/2012   Overview:  Last Assessment & Plan:  Sitz marks has shown inertia-like findings Colonoscopy 2011 for same problem negative (polyps) Last colonoscopy  Will give MiraLax purge - sans bisacodyl Increase MiraLax to 2 doses daily Try daily colchicine 0.6 mg qd F/U Prn  . Chronic low back pain 10/02/2011  . Chronic right hip pain 07/10/2013  . Chronic right shoulder pain 02/14/2012  . CONSTIPATION 02/21/2010   Qualifier: Diagnosis of  By: Myrtie Hawk, Amy S   . COPD (chronic obstructive pulmonary disease) (HCC)    sees Dr/ Delford Field, saw last Dec 2012  . Coronary artery disease    sees Dr. Eden Emms, saw 1 month ago  . Disorder of artery or arteriole (HCC) 09/20/2011   Overview:  Last Assessment & Plan:  Known moderate bilateral disease no TIA  F/u duplex in 6 months Reviewed [place] from today and no change   . Diverticulosis    Colonoscopy 2007/Dr. Leone Payor  . Diverticulosis of large intestine 03/16/2015  . DIVERTICULOSIS-COLON 02/21/2010   Qualifier: Diagnosis of  By: Monica Becton PA-c, Amy S   . Dizziness 01/10/2011  . Elevated PSA 03/16/2015  . Emotional lability 10/02/2011  . Gait disorder 02/14/2012  . Gastroesophageal reflux disease without esophagitis 03/16/2015  . Generalized anxiety disorder  03/16/2015  . GERD 12/04/2006   Qualifier: Diagnosis of  By: Delford Field MD, Charlcie Cradle   . GERD (gastroesophageal reflux disease)   . H/O hiatal hernia   . Hearing loss of right ear 03/30/2012   Overview:  Last Assessment & Plan:  Improved with irrigation,  to f/u any worsening symptoms or concerns  . Hx of CABG 05/23/2011  . Hx of colonic polyp    tubular adenoma (05/16/2009), hyperplastic polyp  . HX OF GALLSTONE 05/21/2007   Qualifier: History of  By: Dorian Pod, Pam    . Hyperlipidemia   . Hyperopia with astigmatism and presbyopia, bilateral 07/19/2017  . Hypersensitivity pneumonitis with associated bronchiectasis 10/11/2010   Hypersensitivity pneumonitis in setting of bronchiectasis.(09/26/10) Neg FOB for pathogens or CA 7/12 Mold found in home RAST assay pos,  Aspergillous, IgE 330 ONO RA 01/26/11: normal      . Imbalance 08/21/2017  . Impaired glucose tolerance 06/13/2011  . Insomnia   . Ischemic foot pain at rest 03/23/2017  . Left carpal tunnel syndrome 07/10/2013  . Lightheaded 06/07/2015  . Lumbar disc disease 10/02/2011  . Nephrolithiasis   . NEPHROLITHIASIS, HX OF 05/21/2007   Qualifier: History of  By: Dorian Pod, Pam    . 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 Nondependent alcohol abuse, in remission 05/21/2007   Qualifier: Diagnosis of  By: Dorian Pod, Pam    . Olecranon bursitis of right elbow 07/10/2013  . Orthostatic hypotension 03/29/2012  . Osteopenia 01/01/2012  . Other chronic pain 05/21/2007   Qualifier: Diagnosis of  By: Dorian Pod, Pam    . PAD (peripheral artery disease) (HCC) 03/23/2017  . Paroxysmal atrial fibrillation (HCC) 05/23/2011  . Peripheral edema 07/09/2012  . PERSONAL HX COLONIC POLYPS 02/21/2010   Qualifier: Diagnosis of  By: Monica Becton PA-c, Amy S   . Pleurisy   . Pneumonia   . Preventative health care 12/11/2010  . Primary osteoarthritis of right hip 03/16/2015  . Pulmonary fibrosis (HCC) 07/10/2013  . Rash 10/25/2015  . Rash and  nonspecific skin eruption 01/12/2013  . Referred otalgia of left ear 08/21/2017  . Right arm pain 04/24/2012  . Right lateral epicondylitis 02/14/2012  . Rotator cuff arthropathy 07/22/2013  . S/P CABG (coronary artery bypass graft)   . Shortness of breath   . 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 Thrombocytopenia (HCC) 02/09/2015  . Tremor 05/23/2011  . Unsteadiness 07/26/2015  . Urinary incontinence 03/16/2015  . Vitamin B12 deficiency 01/01/2012  . Weakness 09/15/2015  . Weight loss 06/13/2011    Past Surgical History:  Procedure Laterality Date  . CARDIAC CATHETERIZATION     Friday 4, 2013  . CATARACT EXTRACTION     bilateral  . COLONOSCOPY     multiple  . CORONARY ARTERY BYPASS GRAFT  03/31/2011   Procedure: CORONARY ARTERY BYPASS GRAFTING (CABG);  Surgeon: [name], MD;  Location[facility] OR;  Service: Open Heart Surgery;  Laterality: N/A;  Times three  using the mammary and right leg greater saphenous vein.   Marland Kitchen KIDNEY STONE SURGERY     removal of kidney stone  . TONSILLECTOMY    . TRANSCAROTID ARTERY REVASCULARIZATION Right 12/13/2017   Procedure: RIGHT TRANSCAROTID ARTERY REVASCULARIZATION;  Surgeon: [name], MD;  Location: [facility] OR;  Service: Vascular;  Laterality: Right;    Current Medications: No outpatient medications have been marked as taking for the 06/29/20 encounter (Appointment) with Baldo Daub, MD.     Allergies:   Penicillins, Alprazolam, Codeine, Sertraline, Tramadol, and Bisacodyl   Social History   Socioeconomic History  . Marital status: Married    Spouse name: Not on file  . Number of children: 3  . Years of education: Not on file  . Highest education level: Not on file  Occupational History  . Occupation: Retired  Tobacco Use  . Smoking status: Former Smoker    Packs/day: 1.00    Years: 10.00    Pack years: 10.00    Types: Cigarettes    Quit date: 03/20/1956    Years since quitting: 64.3  . Smokeless tobacco: Former Neurosurgeon     Types: Snuff, Dorna Bloom    Quit date: 03/20/1948  Vaping Use  . Vaping Use: Never used  Substance and Sexual Activity  . Alcohol use: No  . Drug use: No  . Sexual activity: Not on file  Other Topics Concern  . Not on file  Social History Narrative  . Not on file   Social Determinants of Health   Financial Resource Strain: Not on file  Food Insecurity: Not on file  Transportation Needs: Not on file  Physical Activity: Not on file  Stress: Not on file  Social Connections: Not on file     Family History: The patient's ***family history includes Alcohol abuse in her father; Breast cancer in her sister; Colon cancer in her brother and father; Heart disease in her mother, sister, and another family member; Lung cancer in her brother. ROS:   Please see the history of present illness.    All other systems reviewed and are negative.  EKGs/Labs/Other Studies Reviewed:    The following studies were reviewed today:  EKG:  EKG ordered today and personally reviewed.  The ekg ordered today demonstrates ***  Recent Labs: No results found for requested labs within last 8760 hours.  Recent Lipid Panel    Component Value Date/Time   CHOL 204 (H) 01/09/2013 1437   TRIG 116.0 01/09/2013 1437   HDL 53.20 01/09/2013 1437   CHOLHDL 4 01/09/2013 1437   VLDL 23.2 01/09/2013 1437   LDLDIRECT 140.9 01/09/2013 1437    Physical Exam:    VS:  There were no vitals taken for this visit.    Wt Readings from Last 3 Encounters:  04/29/19 185 lb (83.9 kg)  01/29/19 185 lb (83.9 kg)  03/20/18 194 lb (88 kg)     GEN: *** Well nourished, well developed in no acute distress HEENT: Normal NECK: No JVD; No carotid bruits LYMPHATICS: No lymphadenopathy CARDIAC: ***RRR, no murmurs, rubs, gallops RESPIRATORY:  Clear to auscultation without rales, wheezing or rhonchi  ABDOMEN: Soft, non-tender, non-distended MUSCULOSKELETAL:  No edema; No deformity  SKIN: Warm and dry NEUROLOGIC:  Alert and oriented x  3 PSYCHIATRIC:  Normal affect    Signed, Norman Herrlich, MD  06/28/2020 2:12 PM    [facility]

## 2020-06-29 ENCOUNTER — Ambulatory Visit: Payer: Medicare HMO | Admitting: Cardiology

## 2020-10-04 ENCOUNTER — Other Ambulatory Visit: Payer: Self-pay | Admitting: Vascular Surgery

## 2020-12-09 ENCOUNTER — Telehealth: Payer: Self-pay

## 2020-12-09 ENCOUNTER — Encounter: Payer: Self-pay | Admitting: *Deleted

## 2020-12-09 NOTE — Telephone Encounter (Signed)
Pt has appt 12/14/20 with Dr. Mathis Bud for pre op clearance. I will forward notes to MD for upcoming appt. Will send FYI to surgeon's office pt has appt 12/14/20.

## 2020-12-09 NOTE — Telephone Encounter (Signed)
   [facility] Pre-operative Risk Assessment    Patient Name: Matthew Bridges  DOB: [birth year] MRN: [id number]  HEARTCARE STAFF:  - IMPORTANT!!!!!! Under Visit Info/Reason for Call, type in Other and utilize the format Clearance MM/DD/YY or Clearance TBD. Do not use dashes or single digits. - Please review there is not already an duplicate clearance open for this procedure. - If request is for dental extraction, please clarify the # of teeth to be extracted. - If the patient is currently at the dentist's office, call Pre-Op Callback Staff (MA/nurse) to input urgent request.  - If the patient is not currently in the dentist office, please route to the Pre-Op pool.  Request for surgical clearance:  What type of surgery is being performed? Prostate biopsy   When is this surgery scheduled? TBD  What type of clearance is required (medical clearance vs. Pharmacy clearance to hold med vs. Both)? Both  Are there any medications that need to be held prior to surgery and how long? Plavix 5 days   Practice name and name of physician performing surgery? [facility] Dr. Harlow Asa  What is the office phone number? [phone]   7.   What is the office fax number? [phone]  8.   Anesthesia type (None, local, MAC, general) ? None   Gita Kudo 12/09/2020, 7:49 AM  _________________________________________________________________   (provider comments below)

## 2020-12-09 NOTE — Telephone Encounter (Signed)
   Name: GIBRAN VESELKA  DOB: [birth year]  MRN: [id number]  Primary Cardiologist: Jamison Neighbor (Last seen 01/2019)  Chart reviewed as part of pre-operative protocol coverage. Because of Taichi Repka Zinda's past medical history and time since last visit, she will require a follow-up visit in order to better assess preoperative cardiovascular risk.  Pre-op covering staff: - Please schedule appointment and call patient to inform them. If patient already had an upcoming appointment within acceptable timeframe, please add "pre-op clearance" to the appointment notes so provider is aware. - Please contact requesting surgeon's office via preferred method (i.e, phone, fax) to inform them of need for appointment prior to surgery.  If applicable, this message will also be routed to pharmacy pool and/or primary cardiologist for input on holding anticoagulant/antiplatelet agent as requested below so that this information is available to the clearing provider at time of patient's appointment.   [place], [place]  12/09/2020, 11:11 AM

## 2020-12-09 NOTE — Telephone Encounter (Signed)
Tried to reach pt for pre op appt. Message comes up "call cannot be completed at this time". I have no other numbers to reach the pt. I will send out a letter to the pt to call to make appt as we do not have any alternate # for the pt.

## 2020-12-09 NOTE — Telephone Encounter (Signed)
I will send FYI to requesting office that pt will need an appt for pre op clearance and we have been unable to reach the pt. Pt last seen by cardiology 01/2019.

## 2020-12-13 ENCOUNTER — Ambulatory Visit: Payer: Medicare HMO | Admitting: Cardiology

## 2020-12-14 ENCOUNTER — Ambulatory Visit: Payer: Medicare HMO | Admitting: Cardiology

## 2021-01-13 NOTE — Progress Notes (Signed)
Cardiology Office Note:    Date:  01/14/2021   ID:  Matthew Bridges, DOB 22-Jan-1931, MRN [id number]  PCP:  Lauraine Rinne, MD  Cardiologist:  Shirlee More, MD    Referring MD: Thornton Dales I, MD    ASSESSMENT:    1. CAD in native artery   2. Mixed hyperlipidemia   3. Bilateral carotid artery stenosis   4. Preoperative cardiovascular examination    PLAN:    In order of problems listed above:  Stable CAD after CABG having no anginal discomfort continue current treatment including his antiplatelet statin and beta-blocker. Continue statin check liver function lipid profile I would not repeat a carotid duplex at this time Is scheduled for prostate biopsy from cardiology perspective appropriate candidate we will hold his antiplatelet agents as described   Next appointment: 6 months   Medication Adjustments/Labs and Tests Ordered: Current medicines are reviewed at length with the patient today.  Concerns regarding medicines are outlined above.  No orders of the defined types were placed in this encounter.  No orders of the defined types were placed in this encounter.   Chief Complaint  Patient presents with   Coronary Artery Disease   Pre-op Exam     History of Present Illness:    Matthew Bridges is a 87 y.o. adult with a hx of coronary artery disease bypass surgery in 2013 postoperative atrial fibrillation he is not anticoagulated carotid disease with PCI and stent right carotid artery in 2019 hyperlipidemia and anemia last seen 01/29/2019.  Compliance with diet, lifestyle and medications: Yes  He had a myocardial perfusion study performed 11/26/2017 which showed soft tissue attenuation no scar or ischemia left ventricular function is normal EF 56%.   He is pending prostate biopsy. Request for surgical clearance: 1. What type of surgery is being performed? Prostate biopsy  2. When is this surgery scheduled? TBD 3. What type of clearance is required  (medical clearance vs. Pharmacy clearance to hold med vs. Both)? Both 4. Are there any medications that need to be held prior to surgery and how long? Plavix 5 days  5. Practice name and name of physician performing surgery? [facility] Dr. Harlow Asa 7.   What is the office fax number? [phone] 8.   Anesthesia type (None, local, MAC, general) ? None  His son is present participates in evaluation decision making. His father is quite weak he is in a wheelchair. He has peripheral edema and his son is unsure whether or not he has been given his diuretic if not he will speak to the wife and have him start taking it. He is not having edema shortness of breath chest pain palpitation or syncope He will discontinue aspirin 1 week clopidogrel 5 days prior to biopsy and restart at the direction of his urologist. He has not had recent labs and we will check a CMP and lipid profile today Past Medical History:  Diagnosis Date   Abnormal weight loss 06/13/2011   Overview:  Last Assessment & Plan:  Likely related to postop decreased appetite, may be improving, check tsh,  to f/u any worsening symptoms or concerns   Abnormality of gait and mobility 03/16/2015   Alcohol abuse    Anemia    Anxiety    Arthritis    Arthritis of right hip 01/26/2014   Ultrasound guided injection of the right hip 01/26/2014    ASCVD (arteriosclerotic cardiovascular disease) 03/16/2015   Asthmatic bronchitis    B12 deficiency 01/01/2012  Bilateral hearing loss 06/07/2015   Bilateral impacted cerumen 06/07/2015   Blood transfusion    BPH (benign prostatic hyperplasia) 10/02/2011   Bronchiectasis    Carotid artery occlusion    Carotid disease, bilateral ([place]) 09/20/2011   Carotid stenosis 12/13/2017   Chest pain on exertion 01/25/2011   ? Etiology. Referral to Cardiology made 01/25/11    Chronic bilateral low back pain with right-sided sciatica 03/16/2015   Chronic constipation 07/09/2012   Overview:  Last  Assessment & Plan:  Sitz marks has shown inertia-like findings Colonoscopy 2011 for same problem negative (polyps) Last colonoscopy  Will give MiraLax purge - sans bisacodyl Increase MiraLax to 2 doses daily Try daily colchicine 0.6 mg qd F/U Prn   Chronic low back pain 10/02/2011   Chronic right hip pain 07/10/2013   Chronic right shoulder pain 02/14/2012   CONSTIPATION 02/21/2010   Qualifier: Diagnosis of  By: Shane Crutch, Amy S    COPD (chronic obstructive pulmonary disease) ([place])    sees Dr/ Joya Gaskins, saw last Dec 2012   Coronary artery disease    sees Dr. Johnsie Cancel, saw 1 month ago   Disorder of artery or arteriole ([place]) 09/20/2011   Overview:  Last Assessment & Plan:  Known moderate bilateral disease no TIA  F/u duplex in 6 months Reviewed [place] from today and no change    Diverticulosis    Colonoscopy 2007/Dr. Carlean Purl   Diverticulosis of large intestine 03/16/2015   DIVERTICULOSIS-COLON 02/21/2010   Qualifier: Diagnosis of  By: Trellis Paganini PA-c, Amy S    Dizziness 01/10/2011   Elevated PSA 03/16/2015   Emotional lability 10/02/2011   Gait disorder 02/14/2012   Gastroesophageal reflux disease without esophagitis 03/16/2015   Generalized anxiety disorder 03/16/2015   GERD 12/04/2006   Qualifier: Diagnosis of  By: Joya Gaskins MD, Burnett Harry    GERD (gastroesophageal reflux disease)    H/O hiatal hernia    Hearing loss of right ear 03/30/2012   Overview:  Last Assessment & Plan:  Improved with irrigation,  to f/u any worsening symptoms or concerns   Hx of CABG 05/23/2011   Hx of colonic polyp    tubular adenoma (05/16/2009), hyperplastic polyp   HX OF GALLSTONE 05/21/2007   Qualifier: History of  By: Laney Potash, Pam     Hyperlipidemia    Hyperopia with astigmatism and presbyopia, bilateral 07/19/2017   Hypersensitivity pneumonitis with associated bronchiectasis 10/11/2010   Hypersensitivity pneumonitis in setting of bronchiectasis.(09/26/10) Neg FOB for pathogens or CA 7/12 Mold found in home RAST assay  pos,  Aspergillous, IgE 330 ONO RA 01/26/11: normal       Imbalance 08/21/2017   Impaired glucose tolerance 06/13/2011   Insomnia    Ischemic foot pain at rest 03/23/2017   Left carpal tunnel syndrome 07/10/2013   Lightheaded 06/07/2015   Lumbar disc disease 10/02/2011   Nephrolithiasis    NEPHROLITHIASIS, HX OF 05/21/2007   Qualifier: History of  By: Laney Potash, Pam     Neuromuscular disorder ([place])    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   Nondependent alcohol abuse, in remission 05/21/2007   Qualifier: Diagnosis of  By: Laney Potash, Pam     Olecranon bursitis of right elbow 07/10/2013   Orthostatic hypotension 03/29/2012   Osteopenia 01/01/2012   Other chronic pain 05/21/2007   Qualifier: Diagnosis of  By: Laney Potash, Pam     PAD (peripheral artery disease) ([place]) 03/23/2017   Paroxysmal atrial fibrillation ([place]) 05/23/2011   Peripheral edema 07/09/2012   PERSONAL  HX COLONIC POLYPS 02/21/2010   Qualifier: Diagnosis of  By: Shane Crutch, Amy S    Pleurisy    Pneumonia    Preventative health care 12/11/2010   Primary osteoarthritis of right hip 03/16/2015   Pulmonary fibrosis ([place]) 07/10/2013   Rash 10/25/2015   Rash and nonspecific skin eruption 01/12/2013   Referred otalgia of left ear 08/21/2017   Right arm pain 04/24/2012   Right lateral epicondylitis 02/14/2012   Rotator cuff arthropathy 07/22/2013   S/P CABG (coronary artery bypass graft)    Shortness of breath    Temporomandibular jaw dysfunction 08/21/2017   Thrombocytopenia ([place]) 02/09/2015   Tremor 05/23/2011   Unsteadiness 07/26/2015   Urinary incontinence 03/16/2015   Vitamin B12 deficiency 01/01/2012   Weakness 09/15/2015   Weight loss 06/13/2011    Past Surgical History:  Procedure Laterality Date   CARDIAC CATHETERIZATION     Friday 4, 2013   CATARACT EXTRACTION     bilateral   COLONOSCOPY     multiple   CORONARY ARTERY BYPASS GRAFT  03/31/2011   Procedure: CORONARY ARTERY BYPASS GRAFTING (CABG);  Surgeon: Gaye Pollack, MD;  Location:  [facility];  Service: Open Heart Surgery;  Laterality: N/A;  Times three using the mammary and right leg greater saphenous vein.    KIDNEY STONE SURGERY     removal of kidney stone   TONSILLECTOMY     TRANSCAROTID ARTERY REVASCULARIZATION  Right 12/13/2017   Procedure: RIGHT TRANSCAROTID ARTERY REVASCULARIZATION;  Surgeon: Marty Heck, MD;  Location: [facility] OR;  Service: Vascular;  Laterality: Right;    Current Medications: Current Meds  Medication Sig   albuterol (PROVENTIL HFA;VENTOLIN HFA) 108 (90 Base) MCG/ACT inhaler Inhale 2 puffs into the lungs every 6 (six) hours as needed for wheezing or shortness of breath.   aspirin 81 MG tablet Take 81 mg by mouth daily.   atorvastatin (LIPITOR) 20 MG tablet Take 1 tablet (20 mg total) by mouth daily.   clopidogrel (PLAVIX) 75 MG tablet TAKE 1 TABLET EVERY DAY   clotrimazole-betamethasone (LOTRISONE) cream Apply 1 application topically 2 (two) times daily as needed (rash).   furosemide (LASIX) 20 MG tablet Take 20 mg by mouth daily as needed for fluid or edema.   guaiFENesin (MUCINEX) 600 MG 12 hr tablet Take 2 tablets (1,200 mg total) by mouth 2 (two) times daily as needed for cough or to loosen phlegm.   metoprolol tartrate (LOPRESSOR) 25 MG tablet TAKE 1 TABLET EVERY DAY   Omega-3 Fatty Acids (OMEGA-3 FISH OIL PO) Take 1 tablet by mouth daily.   oxybutynin (DITROPAN) 5 MG tablet Take 5 mg by mouth 2 (two) times daily.   vitamin B-12 (CYANOCOBALAMIN) 1000 MCG tablet Take 1,000 mcg by mouth daily.     Allergies:   Penicillins, Alprazolam, Codeine, Sertraline, Tramadol, and Bisacodyl   Social History   Socioeconomic History   Marital status: Married    Spouse name: Not on file   Number of children: 3   Years of education: Not on file   Highest education level: Not on file  Occupational History   Occupation: Retired  Tobacco Use   Smoking status: Former    Packs/day: 1.00    Years: 10.00    Pack years: 10.00    Types: Cigarettes     Quit date: 03/20/1956    Years since quitting: 64.8   Smokeless tobacco: Former    Types: Snuff, Chew    Quit date: 03/20/1948  Vaping Use  Vaping Use: Never used  Substance and Sexual Activity   Alcohol use: No   Drug use: No   Sexual activity: Not on file  Other Topics Concern   Not on file  Social History Narrative   Not on file   Social Determinants of Health   Financial Resource Strain: Not on file  Food Insecurity: Not on file  Transportation Needs: Not on file  Physical Activity: Not on file  Stress: Not on file  Social Connections: Not on file     Family History: The patient's family history includes Alcohol abuse in her father; Breast cancer in her sister; Colon cancer in her brother and father; Heart disease in her mother, sister, and another family member; Lung cancer in her brother. ROS:   Please see the history of present illness.    All other systems reviewed and are negative.  EKGs/Labs/Other Studies Reviewed:    The following studies were reviewed today:  EKG:  EKG ordered today and personally reviewed.  The ekg ordered today demonstrates sinus rhythm first-degree AV block nonspecific T waves otherwise normal EKG  Recent Labs: No results found for requested labs within last 8760 hours.  Recent Lipid Panel    Component Value Date/Time   CHOL 204 (H) 01/09/2013 1437   TRIG 116.0 01/09/2013 1437   HDL 53.20 01/09/2013 1437   CHOLHDL 4 01/09/2013 1437   VLDL 23.2 01/09/2013 1437   LDLDIRECT 140.9 01/09/2013 1437    Physical Exam:    VS:  BP 110/64   Pulse 68   Ht _0  (1.803 m)   Wt 170 lb (77.1 kg)   SpO2 99%   BMI 23.71 kg/m     Wt Readings from Last 3 Encounters:  01/14/21 170 lb (77.1 kg)  04/29/19 185 lb (83.9 kg)  01/29/19 185 lb (83.9 kg)     GEN: He looks very frail weak well nourished, well developed in no acute distress HEENT: Normal NECK: No JVD; No carotid bruits LYMPHATICS: No lymphadenopathy CARDIAC: RRR, no murmurs,  rubs, gallops RESPIRATORY:  Clear to auscultation without rales, wheezing or rhonchi  ABDOMEN: Soft, non-tender, non-distended MUSCULOSKELETAL: 1-2+ edema edema; No deformity  SKIN: Warm and dry NEUROLOGIC:  Alert and oriented x 3 PSYCHIATRIC:  Normal affect    Signed, Shirlee More, MD  01/14/2021 4:19 PM    [facility]

## 2021-01-14 ENCOUNTER — Other Ambulatory Visit: Payer: Self-pay

## 2021-01-14 ENCOUNTER — Ambulatory Visit: Payer: Medicare HMO | Admitting: Cardiology

## 2021-01-14 ENCOUNTER — Encounter: Payer: Self-pay | Admitting: Cardiology

## 2021-01-14 VITALS — BP 110/64 | HR 68 | Ht 71.0 in | Wt 170.0 lb

## 2021-01-14 DIAGNOSIS — I6523 Occlusion and stenosis of bilateral carotid arteries: Secondary | ICD-10-CM

## 2021-01-14 DIAGNOSIS — I2510 Atherosclerotic heart disease of native coronary artery without angina pectoris: Secondary | ICD-10-CM

## 2021-01-14 DIAGNOSIS — E782 Mixed hyperlipidemia: Secondary | ICD-10-CM | POA: Diagnosis not present

## 2021-01-14 DIAGNOSIS — Z01810 Encounter for preprocedural cardiovascular examination: Secondary | ICD-10-CM | POA: Diagnosis not present

## 2021-01-14 NOTE — Patient Instructions (Signed)
Medication Instructions:  Your physician has recommended you make the following change in your medication:  STOP: Aspirin 7 days prior and Plavix 5 days prior to surgery.  *If you need a refill on your cardiac medications before your next appointment, please call your pharmacy*   Lab Work: Your physician recommends that you return for lab work in: TODAY CMP, Lipids If you have labs (blood work) drawn today and your tests are completely normal, you will receive your results only by: MyChart Message (if you have MyChart) OR A paper copy in the mail If you have any lab test that is abnormal or we need to change your treatment, we will call you to review the results.   Testing/Procedures: None   Follow-Up: At [facility], you and your health needs are our priority.  As part of our continuing mission to provide you with exceptional heart care, we have created designated Provider Care Teams.  These Care Teams include your primary Cardiologist (physician) and Advanced Practice Providers (APPs -  Physician Assistants and Nurse Practitioners) who all work together to provide you with the care you need, when you need it.  We recommend signing up for the patient portal called "MyChart".  Sign up information is provided on this After Visit Summary.  MyChart is used to connect with patients for Virtual Visits (Telemedicine).  Patients are able to view lab/test results, encounter notes, upcoming appointments, etc.  Non-urgent messages can be sent to your provider as well.   To learn more about what you can do with MyChart, go to [email or web address].    Your next appointment:   6 month(s)  The format for your next appointment:   In Person  Provider:   Norman Herrlich, MD   Other Instructions

## 2021-01-15 LAB — COMPREHENSIVE METABOLIC PANEL
ALT: 13 IU/L (ref 0–44)
AST: 12 IU/L (ref 0–40)
Albumin/Globulin Ratio: 2 (ref 1.2–2.2)
Albumin: 4.4 g/dL (ref 3.6–4.6)
Alkaline Phosphatase: 78 IU/L (ref 44–121)
BUN/Creatinine Ratio: 21 (ref 10–24)
BUN: 18 mg/dL (ref 8–27)
Bilirubin Total: 0.8 mg/dL (ref 0.0–1.2)
CO2: 25 mmol/L (ref 20–29)
Calcium: 8.9 mg/dL (ref 8.6–10.2)
Chloride: 106 mmol/L (ref 96–106)
Creatinine, Ser: 0.85 mg/dL (ref 0.76–1.27)
Globulin, Total: 2.2 g/dL (ref 1.5–4.5)
Glucose: 108 mg/dL — ABNORMAL HIGH (ref 70–99)
Potassium: 5 mmol/L (ref 3.5–5.2)
Sodium: 142 mmol/L (ref 134–144)
Total Protein: 6.6 g/dL (ref 6.0–8.5)
eGFR: 84 mL/min/{1.73_m2} (ref >59)

## 2021-01-15 LAB — LIPID PANEL
Chol/HDL Ratio: 2 ratio (ref 0.0–5.0)
Cholesterol, Total: 107 mg/dL (ref 100–199)
HDL: 53 mg/dL (ref >39)
LDL Chol Calc (NIH): 41 mg/dL (ref 0–99)
Triglycerides: 54 mg/dL (ref 0–149)
VLDL Cholesterol Cal: 13 mg/dL (ref 5–40)

## 2021-01-18 ENCOUNTER — Telehealth: Payer: Self-pay

## 2021-01-18 NOTE — Telephone Encounter (Signed)
-----   Message from Baldo Daub, MD sent at 01/18/2021  1:06 PM EDT ----- Normal or stable result  No changes

## 2021-01-18 NOTE — Telephone Encounter (Signed)
Spoke with patient regarding results and recommendation.  Patient verbalizes understanding and is agreeable to plan of care. Advised patient to call back with any issues or concerns.  

## 2021-07-17 NOTE — Progress Notes (Deleted)
Cardiology Office Note:    Date:  07/17/2021   ID:  Matthew Bridges, DOB 1930-12-01, MRN [id number]  PCP:  Henri Medal, MD  Cardiologist:  Norman Herrlich, MD    Referring MD: Jackalyn Lombard I, MD    ASSESSMENT:    No diagnosis found. PLAN:    In order of problems listed above:  ***   Next appointment: ***   Medication Adjustments/Labs and Tests Ordered: Current medicines are reviewed at length with the patient today.  Concerns regarding medicines are outlined above.  No orders of the defined types were placed in this encounter.  No orders of the defined types were placed in this encounter.   No chief complaint on file.   History of Present Illness:    Matthew Bridges is a 88 y.o. adult with a hx of coronary artery disease bypass surgery in 2013 postoperative atrial fibrillation he is not anticoagulated carotid disease with PCI and stent right carotid artery in 2019 hyperlipidemia and anemia  last seen 01/14/2021. Compliance with diet, lifestyle and medications: *** Past Medical History:  Diagnosis Date   Abnormal weight loss 06/13/2011   Overview:  Last Assessment & Plan:  Likely related to postop decreased appetite, may be improving, check tsh,  to f/u any worsening symptoms or concerns   Abnormality of gait and mobility 03/16/2015   Alcohol abuse    Anemia    Anxiety    Arthritis    Arthritis of right hip 01/26/2014   Ultrasound guided injection of the right hip 01/26/2014    ASCVD (arteriosclerotic cardiovascular disease) 03/16/2015   Asthmatic bronchitis    B12 deficiency 01/01/2012   Bilateral hearing loss 06/07/2015   Bilateral impacted cerumen 06/07/2015   Blood transfusion    BPH (benign prostatic hyperplasia) 10/02/2011   Bronchiectasis    Carotid artery occlusion    Carotid disease, bilateral (HCC) 09/20/2011   Carotid stenosis 12/13/2017   Chest pain on exertion 01/25/2011   ? Etiology. Referral to Cardiology made 01/25/11    Chronic  bilateral low back pain with right-sided sciatica 03/16/2015   Chronic constipation 07/09/2012   Overview:  Last Assessment & Plan:  Sitz marks has shown inertia-like findings Colonoscopy 2011 for same problem negative (polyps) Last colonoscopy  Will give MiraLax purge - sans bisacodyl Increase MiraLax to 2 doses daily Try daily colchicine 0.6 mg qd F/U Prn   Chronic low back pain 10/02/2011   Chronic right hip pain 07/10/2013   Chronic right shoulder pain 02/14/2012   CONSTIPATION 02/21/2010   Qualifier: Diagnosis of  By: Myrtie Hawk, Amy S    COPD (chronic obstructive pulmonary disease) (HCC)    sees Dr/ Delford Field, saw last Dec 2012   Coronary artery disease    sees Dr. Eden Emms, saw 1 month ago   Disorder of artery or arteriole (HCC) 09/20/2011   Overview:  Last Assessment & Plan:  Known moderate bilateral disease no TIA  F/u duplex in 6 months Reviewed [place] from today and no change    Diverticulosis    Colonoscopy 2007/Dr. Leone Payor   Diverticulosis of large intestine 03/16/2015   DIVERTICULOSIS-COLON 02/21/2010   Qualifier: Diagnosis of  By: Monica Becton PA-c, Amy S    Dizziness 01/10/2011   Elevated PSA 03/16/2015   Emotional lability 10/02/2011   Gait disorder 02/14/2012   Gastroesophageal reflux disease without esophagitis 03/16/2015   Generalized anxiety disorder 03/16/2015   GERD 12/04/2006   Qualifier: Diagnosis of  By: Delford Field MD, Charlcie Cradle  GERD (gastroesophageal reflux disease)    H/O hiatal hernia    Hearing loss of right ear 03/30/2012   Overview:  Last Assessment & Plan:  Improved with irrigation,  to f/u any worsening symptoms or concerns   Hx of CABG 05/23/2011   Hx of colonic polyp    tubular adenoma (05/16/2009), hyperplastic polyp   HX OF GALLSTONE 05/21/2007   Qualifier: History of  By: Dorian Pod, Pam     Hyperlipidemia    Hyperopia with astigmatism and presbyopia, bilateral 07/19/2017   Hypersensitivity pneumonitis with associated bronchiectasis 10/11/2010   Hypersensitivity  pneumonitis in setting of bronchiectasis.(09/26/10) Neg FOB for pathogens or CA 7/12 Mold found in home RAST assay pos,  Aspergillous, IgE 330 ONO RA 01/26/11: normal       Imbalance 08/21/2017   Impaired glucose tolerance 06/13/2011   Insomnia    Ischemic foot pain at rest 03/23/2017   Left carpal tunnel syndrome 07/10/2013   Lightheaded 06/07/2015   Lumbar disc disease 10/02/2011   Nephrolithiasis    NEPHROLITHIASIS, HX OF 05/21/2007   Qualifier: History of  By: Dorian Pod, Pam     Neuromuscular disorder (HCC)    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   Nondependent alcohol abuse, in remission 05/21/2007   Qualifier: Diagnosis of  By: Dorian Pod, Pam     Olecranon bursitis of right elbow 07/10/2013   Orthostatic hypotension 03/29/2012   Osteopenia 01/01/2012   Other chronic pain 05/21/2007   Qualifier: Diagnosis of  By: Dorian Pod, Pam     PAD (peripheral artery disease) (HCC) 03/23/2017   Paroxysmal atrial fibrillation (HCC) 05/23/2011   Peripheral edema 07/09/2012   PERSONAL HX COLONIC POLYPS 02/21/2010   Qualifier: Diagnosis of  By: Myrtie Hawk, Amy S    Pleurisy    Pneumonia    Preventative health care 12/11/2010   Primary osteoarthritis of right hip 03/16/2015   Pulmonary fibrosis (HCC) 07/10/2013   Rash 10/25/2015   Rash and nonspecific skin eruption 01/12/2013   Referred otalgia of left ear 08/21/2017   Right arm pain 04/24/2012   Right lateral epicondylitis 02/14/2012   Rotator cuff arthropathy 07/22/2013   S/P CABG (coronary artery bypass graft)    Shortness of breath    Temporomandibular jaw dysfunction 08/21/2017   Thrombocytopenia (HCC) 02/09/2015   Tremor 05/23/2011   Unsteadiness 07/26/2015   Urinary incontinence 03/16/2015   Vitamin B12 deficiency 01/01/2012   Weakness 09/15/2015   Weight loss 06/13/2011    Past Surgical History:  Procedure Laterality Date   CARDIAC CATHETERIZATION     Friday 4, 2013   CATARACT EXTRACTION     bilateral   COLONOSCOPY     multiple   CORONARY ARTERY  BYPASS GRAFT  03/31/2011   Procedure: CORONARY ARTERY BYPASS GRAFTING (CABG);  Surgeon: [name], MD;  Location: [facility] OR;  Service: Open Heart Surgery;  Laterality: N/A;  Times three using the mammary and right leg greater saphenous vein.    KIDNEY STONE SURGERY     removal of kidney stone   TONSILLECTOMY     TRANSCAROTID ARTERY REVASCULARIZATION  Right 12/13/2017   Procedure: RIGHT TRANSCAROTID ARTERY REVASCULARIZATION;  Surgeon: [name], MD;  Location: [facility] OR;  Service: Vascular;  Laterality: Right;    Current Medications: No outpatient medications have been marked as taking for the 07/18/21 encounter (Appointment) with Baldo Daub, MD.     Allergies:   Penicillins, Alprazolam, Codeine, Sertraline, Tramadol, and Bisacodyl   Social History   Socioeconomic  History   Marital status: Married    Spouse name: Not on file   Number of children: 3   Years of education: Not on file   Highest education level: Not on file  Occupational History   Occupation: Retired  Tobacco Use   Smoking status: Former    Packs/day: 1.00    Years: 10.00    Pack years: 10.00    Types: Cigarettes    Quit date: 03/20/1956    Years since quitting: 65.3   Smokeless tobacco: Former    Types: Snuff, Chew    Quit date: 03/20/1948  Vaping Use   Vaping Use: Never used  Substance and Sexual Activity   Alcohol use: No   Drug use: No   Sexual activity: Not on file  Other Topics Concern   Not on file  Social History Narrative   Not on file   Social Determinants of Health   Financial Resource Strain: Not on file  Food Insecurity: Not on file  Transportation Needs: Not on file  Physical Activity: Not on file  Stress: Not on file  Social Connections: Not on file     Family History: The patient's ***family history includes Alcohol abuse in her father; Breast cancer in her sister; Colon cancer in her brother and father; Heart disease in her mother, sister, and another family member; Lung  cancer in her brother. ROS:   Please see the history of present illness.    All other systems reviewed and are negative.  EKGs/Labs/Other Studies Reviewed:    The following studies were reviewed today:  EKG:  EKG ordered today and personally reviewed.  The ekg ordered today demonstrates ***  Recent Labs: 01/14/2021: ALT 13; BUN 18; Creatinine, Ser 0.85; Potassium 5.0; Sodium 142  Recent Lipid Panel    Component Value Date/Time   CHOL 107 01/14/2021 1629   TRIG 54 01/14/2021 1629   HDL 53 01/14/2021 1629   CHOLHDL 2.0 01/14/2021 1629   CHOLHDL 4 01/09/2013 1437   VLDL 23.2 01/09/2013 1437   LDLCALC 41 01/14/2021 1629   LDLDIRECT 140.9 01/09/2013 1437    Physical Exam:    VS:  There were no vitals taken for this visit.    Wt Readings from Last 3 Encounters:  01/14/21 170 lb (77.1 kg)  04/29/19 185 lb (83.9 kg)  01/29/19 185 lb (83.9 kg)     GEN: *** Well nourished, well developed in no acute distress HEENT: Normal NECK: No JVD; No carotid bruits LYMPHATICS: No lymphadenopathy CARDIAC: ***RRR, no murmurs, rubs, gallops RESPIRATORY:  Clear to auscultation without rales, wheezing or rhonchi  ABDOMEN: Soft, non-tender, non-distended MUSCULOSKELETAL:  No edema; No deformity  SKIN: Warm and dry NEUROLOGIC:  Alert and oriented x 3 PSYCHIATRIC:  Normal affect    Signed, Norman HerrlichBrian Raelie Lohr, MD  07/17/2021 11:21 AM    [facility]

## 2021-07-18 ENCOUNTER — Ambulatory Visit: Payer: Medicare HMO | Admitting: Cardiology

## 2021-10-17 ENCOUNTER — Other Ambulatory Visit: Payer: Self-pay | Admitting: Vascular Surgery

## 2021-11-18 DEATH — deceased
